# Patient Record
Sex: Female | Born: 1970 | Race: White | Hispanic: No | Marital: Single | Smoking: Current every day smoker
Health system: Southern US, Community
[De-identification: ages and names within clinical notes are randomized; demographics above are authoritative.]

## PROBLEM LIST (undated history)

## (undated) ENCOUNTER — Ambulatory Visit

## (undated) DIAGNOSIS — J439 Emphysema, unspecified: Secondary | ICD-10-CM

## (undated) DIAGNOSIS — K5090 Crohn's disease, unspecified, without complications: Secondary | ICD-10-CM

## (undated) DIAGNOSIS — F909 Attention-deficit hyperactivity disorder, unspecified type: Secondary | ICD-10-CM

## (undated) DIAGNOSIS — M5030 Other cervical disc degeneration, unspecified cervical region: Secondary | ICD-10-CM

## (undated) DIAGNOSIS — E7800 Pure hypercholesterolemia, unspecified: Secondary | ICD-10-CM

## (undated) DIAGNOSIS — J449 Chronic obstructive pulmonary disease, unspecified: Secondary | ICD-10-CM

## (undated) DIAGNOSIS — I10 Essential (primary) hypertension: Secondary | ICD-10-CM

## (undated) DIAGNOSIS — F419 Anxiety disorder, unspecified: Secondary | ICD-10-CM

## (undated) HISTORY — DX: Emphysema, unspecified: J43.9

## (undated) HISTORY — DX: Chronic obstructive pulmonary disease, unspecified: J44.9

## (undated) HISTORY — PX: TUBAL LIGATION: SHX77

## (undated) HISTORY — DX: Crohn's disease, unspecified, without complications: K50.90

## (undated) HISTORY — DX: Pure hypercholesterolemia, unspecified: E78.00

## (undated) HISTORY — DX: Attention-deficit hyperactivity disorder, unspecified type: F90.9

---

## 2000-07-28 ENCOUNTER — Other Ambulatory Visit: Admission: RE | Admit: 2000-07-28 | Discharge: 2000-07-28 | Payer: Self-pay | Admitting: *Deleted

## 2000-11-03 ENCOUNTER — Encounter: Payer: Self-pay | Admitting: *Deleted

## 2000-11-03 ENCOUNTER — Ambulatory Visit (HOSPITAL_COMMUNITY): Admission: RE | Admit: 2000-11-03 | Discharge: 2000-11-03 | Payer: Self-pay | Admitting: *Deleted

## 2001-01-21 ENCOUNTER — Encounter: Payer: Self-pay | Admitting: *Deleted

## 2001-01-21 ENCOUNTER — Ambulatory Visit (HOSPITAL_COMMUNITY): Admission: RE | Admit: 2001-01-21 | Discharge: 2001-01-21 | Payer: Self-pay | Admitting: *Deleted

## 2001-01-24 ENCOUNTER — Inpatient Hospital Stay (HOSPITAL_COMMUNITY): Admission: AD | Admit: 2001-01-24 | Discharge: 2001-01-24 | Payer: Self-pay | Admitting: *Deleted

## 2001-03-06 ENCOUNTER — Inpatient Hospital Stay (HOSPITAL_COMMUNITY): Admission: RE | Admit: 2001-03-06 | Discharge: 2001-03-08 | Payer: Self-pay | Admitting: Obstetrics and Gynecology

## 2001-03-25 ENCOUNTER — Emergency Department (HOSPITAL_COMMUNITY): Admission: EM | Admit: 2001-03-25 | Discharge: 2001-03-25 | Payer: Self-pay | Admitting: *Deleted

## 2001-06-09 ENCOUNTER — Other Ambulatory Visit: Admission: RE | Admit: 2001-06-09 | Discharge: 2001-06-09 | Payer: Self-pay | Admitting: *Deleted

## 2004-01-22 ENCOUNTER — Ambulatory Visit (HOSPITAL_COMMUNITY): Admission: RE | Admit: 2004-01-22 | Discharge: 2004-01-22 | Payer: Self-pay | Admitting: *Deleted

## 2004-02-19 ENCOUNTER — Ambulatory Visit (HOSPITAL_COMMUNITY): Admission: RE | Admit: 2004-02-19 | Discharge: 2004-02-19 | Payer: Self-pay | Admitting: *Deleted

## 2004-06-17 ENCOUNTER — Ambulatory Visit (HOSPITAL_COMMUNITY): Admission: RE | Admit: 2004-06-17 | Discharge: 2004-06-17 | Payer: Self-pay | Admitting: Family Medicine

## 2004-07-04 ENCOUNTER — Emergency Department (HOSPITAL_COMMUNITY): Admission: EM | Admit: 2004-07-04 | Discharge: 2004-07-04 | Payer: Self-pay | Admitting: Emergency Medicine

## 2005-09-29 ENCOUNTER — Ambulatory Visit (HOSPITAL_COMMUNITY): Admission: RE | Admit: 2005-09-29 | Discharge: 2005-09-29 | Payer: Self-pay | Admitting: Family Medicine

## 2006-08-04 ENCOUNTER — Ambulatory Visit (HOSPITAL_COMMUNITY): Admission: RE | Admit: 2006-08-04 | Discharge: 2006-08-04 | Payer: Self-pay | Admitting: Family Medicine

## 2007-12-09 ENCOUNTER — Ambulatory Visit (HOSPITAL_COMMUNITY): Admission: RE | Admit: 2007-12-09 | Discharge: 2007-12-09 | Payer: Self-pay | Admitting: Family Medicine

## 2008-03-15 ENCOUNTER — Ambulatory Visit (HOSPITAL_COMMUNITY): Admission: RE | Admit: 2008-03-15 | Discharge: 2008-03-15 | Payer: Self-pay | Admitting: Family Medicine

## 2008-04-08 ENCOUNTER — Emergency Department (HOSPITAL_COMMUNITY): Admission: EM | Admit: 2008-04-08 | Discharge: 2008-04-08 | Payer: Self-pay | Admitting: Emergency Medicine

## 2008-08-15 ENCOUNTER — Emergency Department (HOSPITAL_COMMUNITY): Admission: EM | Admit: 2008-08-15 | Discharge: 2008-08-15 | Payer: Self-pay | Admitting: Emergency Medicine

## 2008-09-25 ENCOUNTER — Ambulatory Visit (HOSPITAL_COMMUNITY): Admission: RE | Admit: 2008-09-25 | Discharge: 2008-09-25 | Payer: Self-pay | Admitting: Family Medicine

## 2008-09-25 ENCOUNTER — Emergency Department (HOSPITAL_COMMUNITY): Admission: EM | Admit: 2008-09-25 | Discharge: 2008-09-25 | Payer: Self-pay | Admitting: Emergency Medicine

## 2008-10-10 ENCOUNTER — Emergency Department (HOSPITAL_COMMUNITY): Admission: EM | Admit: 2008-10-10 | Discharge: 2008-10-10 | Payer: Self-pay | Admitting: Emergency Medicine

## 2008-11-08 ENCOUNTER — Emergency Department (HOSPITAL_COMMUNITY): Admission: EM | Admit: 2008-11-08 | Discharge: 2008-11-08 | Payer: Self-pay | Admitting: Emergency Medicine

## 2009-01-01 ENCOUNTER — Encounter
Admission: RE | Admit: 2009-01-01 | Discharge: 2009-01-01 | Payer: Self-pay | Admitting: Physical Medicine & Rehabilitation

## 2009-03-13 ENCOUNTER — Emergency Department (HOSPITAL_COMMUNITY): Admission: EM | Admit: 2009-03-13 | Discharge: 2009-03-13 | Payer: Self-pay | Admitting: Emergency Medicine

## 2009-06-20 ENCOUNTER — Emergency Department (HOSPITAL_COMMUNITY): Admission: EM | Admit: 2009-06-20 | Discharge: 2009-06-20 | Payer: Self-pay | Admitting: Emergency Medicine

## 2009-09-08 ENCOUNTER — Emergency Department (HOSPITAL_COMMUNITY): Admission: EM | Admit: 2009-09-08 | Discharge: 2009-09-08 | Payer: Self-pay | Admitting: Emergency Medicine

## 2009-11-11 ENCOUNTER — Emergency Department (HOSPITAL_COMMUNITY): Admission: EM | Admit: 2009-11-11 | Discharge: 2009-11-11 | Payer: Self-pay | Admitting: Emergency Medicine

## 2009-12-31 ENCOUNTER — Emergency Department (HOSPITAL_COMMUNITY): Admission: EM | Admit: 2009-12-31 | Discharge: 2009-12-31 | Payer: Self-pay | Admitting: Emergency Medicine

## 2010-04-21 ENCOUNTER — Encounter: Payer: Self-pay | Admitting: *Deleted

## 2010-04-21 ENCOUNTER — Encounter: Payer: Self-pay | Admitting: Internal Medicine

## 2010-07-06 LAB — WET PREP, GENITAL
Trich, Wet Prep: NONE SEEN
Yeast Wet Prep HPF POC: NONE SEEN

## 2010-07-15 LAB — URINALYSIS, ROUTINE W REFLEX MICROSCOPIC
Glucose, UA: NEGATIVE mg/dL
Specific Gravity, Urine: >1.03 — ABNORMAL HIGH (ref 1.005–1.030)
pH: 5 (ref 5.0–8.0)

## 2010-08-16 NOTE — Discharge Summary (Signed)
[facility]  Patient:    Leslie Lowery, Leslie Lowery Visit Number: [id number] MRN: [id number]          Service Type: OBS Location: 4A A428 01 Attending Physician:  Jeri Cos. Dictated by:   Christin Bach, M.D. Admit Date:  01/24/2001 Discharge Date: 01/24/2001                             Discharge Summary  ADMITTING DIAGNOSES: 1. Pregnancy at [redacted] weeks gestation. 2. Third trimester bleeding. 3. Partial placenta previa.  DISCHARGE DIAGNOSES: 1. Pregnancy at [redacted] weeks gestation. 2. Third trimester bleeding.  PROCEDURES: 1. Betamethasone administration for lung maturity. 2. Terbutaline tocolysis.  DISPOSITION:  Transferred to [facility].  HOSPITAL SUMMARY:  This 40 year old female, gravida 2, para 1, with unreliable criteria except for ultrasound suggesting her to be [redacted] weeks gestation and an 18-week ultrasound as well at this facility; the patient was 30 weeks at my best estimate.  She presented with a heavy gush of vaginal bleeding, two days after last sexual activity, with no other significant physical activity noted. There was no gush of fluid.  External monitoring shows a mild uterine irritability pattern with every one- to one-and-a-half-minute contractions. Heart rate shows a reactive pattern.  Patient was admitted for magnesium sulfate tocolysis.  PAST MEDICAL HISTORY:  Benign.  SURGICAL HISTORY:  LST C-section, August 1990, laser conization of the cervix in 1988.  PHYSICAL EXAMINATION:  Height 5 feet 6 inches.  Weight 185 pounds.  Pertinent abdominal exam shows mild uterine irritability.  Cervical exam is deferred.  HOSPITAL COURSE:  Patient had an ultrasound performed by me confirming a left-sided low-lying placenta with large portion of the placental tissue both anterior and posterior on the lower uterine segment on the left side.  The patient was admitted for bedrest, pad counts and consideration for  steroid therapy.  Patient was observed for a few hours and at approximately 9:30 p.m. on the 27th, the patient began to bleed again.  Patient was considered at that point in time to be at risk for marginal separation and obligatory delivery. Patient was therefore shipped to [facility] for continued observation due to the prematurity of the infant.  CONDITION ON DISCHARGE:  Stable.  FOLLOWUP:  ______ Dictated by:   Christin Bach, M.D. Attending Physician:  Jeri Cos. DD:  02/14/01 TD:  02/15/01 Job: [id number]

## 2010-08-16 NOTE — Discharge Summary (Signed)
[facility]  Patient:    Leslie Lowery, Leslie Lowery Visit Number: [id number] MRN: [id number]          Service Type: MED Location: 4A A418 01 Attending Physician:  Jeri Cos. Dictated by:   [name], M.D. Admit Date:  03/06/2001 Discharge Date: 03/08/2001                             Discharge Summary  DIAGNOSES: 1. A 36-plus-week intrauterine pregnancy. 2. Spontaneous rupture of membranes and preterm labor. 3. Previous low transverse cesarean section; the patient desires repeat, with    tubal ligation. 4. Class A1 diet-controlled gestational diabetes.  LABORATORY DATA:  Blood type O positive.  Hemoglobin 11.6, hematocrit 33.3; postpartum day #1 9.5/27.3.  PEDIATRICIAN ON CALL:  Vivia Ewing, D.O.  Delivered is a double-footling breech presentation female infant weighing 6 pounds, 14.7 ounces.  HISTORY OF PRESENT ILLNESS AND HOSPITAL COURSE:  See previous dictations.  The patient presented to [facility] on March 06, 2001, with obvious rupture of membranes and moderate amount of vaginal bleeding due to marginal sinus separation of the placenta.  Thus, the patient was taken to the operating room.  Utilizing spinal analgesic, a primary low transverse cesarean section and intraoperative tubal ligation was performed without complications. Postoperatively the patient had a JP catheter in the subcutaneous space as well as Foley catheter placed to straight drainage.  These were both removed on postoperative day #1, at which time the incision was noted to be intact, minimal erythema, minimal induration.  The patient continued to advance her diet, such that on March 08, 2001, the patient was tolerating liquids well. However, she still had not taken any solid food and still had not taken any p.o. pain medication.  Thus, diet as well as medication were advanced on March 08, 2001, to be sure that she was able to tolerate these.  FOLLOW-UP:  The  patient is to follow up in the office in two to three days time for staple removal. Dictated by:   [name], M.D. Attending Physician:  Jeri Cos. DD:  03/08/01 TD:  03/09/01 Job: [id number]

## 2010-08-16 NOTE — Op Note (Signed)
[facility]  Patient:    Leslie Lowery, Leslie Lowery Visit Number: [id number] MRN: [id number]          Service Type: MED Location: 4A A418 01 Attending Physician:  Tilda Burrow Dictated by:   [name], M.D. Proc. Date: 03/06/01 Admit Date:  03/06/2001   CC:         Vivia Ewing, D.O.   Operative Report  POSTOPERATIVE DIAGNOSES:  1.  Intrauterine pregnancy at 36+ weeks.  2.  Vaginal bleeding secondary to marginal sinus separation.  3.  Spontaneous rupture of membranes.  4.  Preterm labor.  5.  Previous low transverse cesarean section.  6.  Patient desires permanent sterilization.  7.  Class A1 gestational diabetes mellitus during this pregnancy with      dietary control only.  OPERATION/PROCEDURE:  Repeat low transverse cesarean section with intraoperative tubal ligation.  SURGEON:  [name], M.D.  FINDINGS:  Delivered of a 6 pound 14.7 ounce female infant.  ESTIMATED BLOOD LOSS:  Eight hundred cc.  ANALGESIA:  Spinal.  PEDIATRICIAN ON-CALL:  Dr. Milford Cage.  DRAINS:  Foley catheter to straight drainage.  Likewise, a JP catheter was placed within the subcutaneous space.  INDICATIONS FOR PROCEDURE:  The patient presented in early labor with rupture of membranes.  We made preparations for repeat low transverse cesarean section.  The patient did receive two doses of 0.25 mg subcu terbutaline for prophylaxis while we were preparing for the cesarean section.  DESCRIPTION OF PROCEDURE:  The patient was taken to the operating room.  Vital signs were stable.  Fetal heart tones remained reassuring.  The patient had a spinal analgesic administered by the anesthesia staff.  She was then returned to the operating room table with slight left lateral tilt.  A Foley catheter had been sterilely placed to straight drainage on the labor and delivery Floor.  The patients abdomen was then sterilely prepped and draped in the usual manner.  After assuring  adequate surgical analgesia a sharp knife was used to excise the previous Pfannenstiel incision through the skin, dissecting down to the fascial plane utilizing a sharp knife, and cauterizing all bleeders along the way.  The fascia was then incised in transverse curvilinear manner utilizing the Mayo scissors.  The fascial edges were then grasped using straight Kocher clamps and the fascia separated from the underlying rectus muscle in the midline utilizing sharp dissection with the Mayo scissors.  The rectus muscles were then bluntly separated to aid atraumatic entry into the peritoneal cavity.  The peritoneal incision was then extended superiorly and inferiorly and inferiorly we directly visualized the bladder to avoid accidental injury.  The bladder blade was then placed.  The lower uterine segment was identified and palpated.  There was no palpable vertex presenting. A bladder flap was then created from the vesicouterine fold by dissecting the avascular plane.  A sharp knife was then used to score a low transverse uterine incision.  The amniotic sac was encountered in the midline, with additional clear amniotic fluid noted.  The index fingers were then used to bluntly extend the uterine incision bilaterally.  My right hand then reached into the uterine cavity, at which time presenting was noted two feet, and thus double footling breech presentation.  With fundal pressure as well as traction on the feet I was able to deliver the infant to the level of the buttocks. With one hand on the patients back and one hand on the patients tummy I was able to  reduce the shoulders anteriorly across the infants chest, and deliver the shoulders and arms, followed by the remainder of the infant.  The head was flexed utilizing a Mauriceau maneuver and delivered atraumatically.  The umbilical cord was then milked toward the infant and the cord doubly clamped and cut.  The infant did not have spontaneous  vigorous breathing and cry and was taken to the nursing table for immediate assessment.   Arterial cord gas and cord blood were then obtained.  Gentle traction on the umbilical cord resulted in separation, which appears to be somewhat of a fragmented placenta, with some adherent clot to the maternal surface.  Intrauterine exploration then revealed no retained placental fragments.  Thus, the uterus was exteriorized.  The uterine incision was noted to not have extended.  The uterine incision was then closed in two layers with 0 chromic in running locked fashion, the second layer being an imbricating layer, which resulted in excellent hemostasis.  Tubes and ovaries were noted to be normal in appearance.  Tubal ligation was then performed as follows.  Each of the tubes was grasped in its mid portion and elevated.  Two pieces of #1 plain suture were then placed at the base of the loop of tube formed.  This allowed removal of a knuckle of right fallopian tube as well as left fallopian tube.  A suture was placed through the left.  The pedicles were dried from the tubal ligation. Irrigation was then performed of the cul-de-sac until clear.  The uterus was then returned to the pelvic cavity.  Sponge, needle, and instrument counts were correct x 2 at this point.  The peritoneal edges were then grasped using Kelly clamps and the peritoneum closed with a continuous running 0 chromic suture.  Rectus muscles were reapproximated utilizing continuous running 0 chromic suture.  Subfascial bleeders were cauterized.  The fascia was then closed with a continuous running #1 PDS suture, two sutures required, and they were tied together in the midline.  Subcutaneous bleeders were then cauterized.  A JP drain was placed within the subcutaneous space with a separate exit wound to the right apex of the incision.  The JP drain was sutured into place.  The subcutaneous space was irrigated with Betadine solution.   Three interrupted #1 PDS sutures were then placed  through-and-through the skin edges to act as retention type suture, which allowed stapling of the skin edges resulting in complete closure.  The patient tolerated the procedure well.  To facilitate postoperative analgesia 25 cc of 0.5% bupivacaine was injected subcutaneously along the incision.  The patient was taken to the recovery room in stable condition.  Continues to drain clear yellow urine.  Operative findings were discussed with the patients awaiting family. Dictated by:   [name], M.D. Attending Physician:  Tilda Burrow DD:  03/08/01 TD:  03/08/01 Job: [id number]

## 2010-08-16 NOTE — H&P (Signed)
[facility]  Patient:    Leslie Lowery, Leslie Lowery Visit Number: [id number] MRN: [id number]          Service Type: OBS Location: 4A A415 01 Attending Physician:  Jeri Cos. Dictated by:   Christin Bach, M.D. Admit Date:  01/24/2001   CC:         [name], M.D.   History and Physical  ADMITTING DIAGNOSES:  Pregnancy, [redacted] weeks gestation, third trimester bleeding, partial placenta previa.  HISTORY OF PRESENT ILLNESS:  This 40 year old female, gravida 2, para 1, last menstrual period June 22, 2000 not considered reliable with ultrasound suggesting her to be 9 weeks by ______ length on August 31, 2000 placing menstrual [facility] April 05, 2000. By this criteria, she is ________ weeks. The patient recently had an ultrasound at [redacted] weeks gestation at [facility] which recognized a partial placenta previa with follow-up ultrasound performed last Thursday and reported by patient as being still present. She has been sexually active as recently as two days ago and has not been particularly physical active otherwise. There has been no gushing of fluid. Upon arrival, the external monitoring shows a uterine irritability pattern with mild uterine contractions every 1 to 1 1/2 minutes with fetal heart rate tracing showing a reactive heart rate pattern. The patient is admitted for labor management and may need some sulfate tocolysis.  Ultrasound by me has been performed which confirms the left sided low lying placenta with large caudal bleeding of placental tissue both anterior and posterior lower uterine segments on the left side. There is possibly some blood below the bleeding placental edge. This is thought to be the source of the patients bleeding. The patient is compliant with bed rest at present. Pad count will be continued and consideration of steroid therapy.  PAST MEDICAL HISTORY:  Benign.  PAST SURGICAL HISTORY:  Primary low transverse cervical  cesarean section February 24, 1989 for failure to dilate. Laser cone of the cervix 1988 with normal Pap smears since then.  PHYSICAL EXAMINATION:  VITAL SIGNS:  Height 5 feet 6, weight about 185.  ABDOMEN:  Nontender with mild uterine irritability. Cervix deferred. Ultrasound showed some periplacental hyperechoic areas suggestive of marginal sinus placental bleeding, less than 5%.  PLAN:  Overnight admission with betamethasone therapy, limited activity, magnesium sulfate tocolysis. Dictated by:   Christin Bach, M.D. Attending Physician:  Jeri Cos. DD:  01/24/01 TD:  01/24/01 Job: [id number]

## 2010-08-16 NOTE — Procedures (Signed)
NAMEDIANNA, Leslie Lowery             ACCOUNT NO.:  [id number]   MEDICAL RECORD NO.:  [id number]          PATIENT TYPE:  OUT   LOCATION:  RESP                          FACILITY:  [facility]   PHYSICIAN:  Edward L. Juanetta Gosling, M.D.DATE OF BIRTH:  January 25, 1971   DATE OF PROCEDURE:  DATE OF DISCHARGE:                              PULMONARY FUNCTION TEST   1.  Spirometry is generally normal with a slightly decreased FEV-1 to FVC      ratio, suggesting of airflow obstruction.  2.  Lung volumes show no evidence of restrictive change but do show some      evidence of air trapping, again consistent with air flow obstruction.  3.  DLCO is moderately reduced.   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      ELH/MEDQ  D:  06/18/2004  T:  06/18/2004  Job:  [id number]

## 2010-08-16 NOTE — Consult Note (Signed)
[facility]  Patient:    Leslie Lowery, Leslie Lowery Visit Number: [id number] MRN: [id number]          Service Type: EMS Location: ED Attending Physician:  Ilean Skill Dictated by:   Christin Bach, M.D. Admit Date:  03/25/2001   CC:         Leslie Lowery, M.D.   Consultation Report  REASON FOR CONSULTATION:  Drainage from recent C-section incision.  HISTORY OF PRESENT ILLNESS:  This 40 year old female gravida 2, para 2, now 3 weeks status post C-section and tubal ligation performed through a Pfannenstiel incision presents after 24 hours of malodorous discharge from a Pfannenstiel incision.  The patient had C-section approximately 3 weeks ago for partial placental previa with prior cesarean having been performed.  Tubal ligation was performed at the same time.  Hospital records not available at the time of this dictation but reportedly uneventful intraoperative time. Pregnancy being complicated by several weeks hospitalization at [facility] for placenta previa.  The patient began to have malodorous, clear, watery discharge from the incision yesterday and after a day of using towels and wash cloths she presents to the emergency room.  PHYSICAL EXAMINATION:  VITAL SIGNS:  She is now with a low grade fever with temperature 99.4, pulse 103, respirations 20, blood pressure 113/74.  GENERAL:  She was a healthy, highly anxious, Caucasian female, appropriate, and consistent with prior visits.  Patient is well known to me and Dr. Lisette Grinder.  The patient is alert, oriented and anxious and accompanied by husband.  SKIN:  Warm, normal color with no dehydration, normal respirations.  NOTE:  The patient states that she has had dysuria and pressure when the bladder is full but without frequency.  ABDOMEN:  Moderate size panniculus with draining 1 cm area of ecchymosis in the center of the old C-section scar.  Scar is healing somewhat irregularly having  everted on the right side but the tissues are without frank purulence. The discharge is somewhat watery.  PROCEDURE:  The wound is cleansed with Betadine and then hydrogen peroxide and then local anesthesia used to infiltrate around the suspected drainage site. The wound easily separates to a distance of 3 cm transversely and is able to be probed with a finger to a depth of approximately 3 cm.  Cleansing with hydrogen peroxide followed by placement of wet-to-dry dressing with 4 x 4 gauze is performed.  This was performed under Demerol IM injection for analgesia.  IMPRESSION:  Wound seroma, infected (wound cellulitis).  MEDICATIONS: 1. Cipro 500 mg b.i.d. x 7 days. 2. Tylox 1 q.4h. p.r.n. pain dispense #15.  FOLLOWUP:  The patient is to see Dr. Lisette Grinder in the a.m. for dressing change. May require more than 1 dressing change before healing acceptable for allowing delayed secondary closure.Dictated by:   Christin Bach, M.D. Attending Physician:  Ilean Skill DD:  03/25/01 TD:  03/26/01 Job: [id number]

## 2011-08-05 ENCOUNTER — Emergency Department (HOSPITAL_COMMUNITY)
Admission: EM | Admit: 2011-08-05 | Discharge: 2011-08-05 | Disposition: A | Discharge status: Home / Self Care | Payer: No Typology Code available for payment source | Attending: Emergency Medicine | Admitting: Emergency Medicine

## 2011-08-05 ENCOUNTER — Encounter (HOSPITAL_COMMUNITY): Payer: Self-pay

## 2011-08-05 ENCOUNTER — Emergency Department (HOSPITAL_COMMUNITY): Payer: No Typology Code available for payment source

## 2011-08-05 DIAGNOSIS — S139XXA Sprain of joints and ligaments of unspecified parts of neck, initial encounter: Secondary | ICD-10-CM | POA: Insufficient documentation

## 2011-08-05 DIAGNOSIS — Y92410 Unspecified street and highway as the place of occurrence of the external cause: Secondary | ICD-10-CM | POA: Insufficient documentation

## 2011-08-05 DIAGNOSIS — F17200 Nicotine dependence, unspecified, uncomplicated: Secondary | ICD-10-CM | POA: Insufficient documentation

## 2011-08-05 DIAGNOSIS — S161XXA Strain of muscle, fascia and tendon at neck level, initial encounter: Secondary | ICD-10-CM

## 2011-08-05 DIAGNOSIS — S0990XA Unspecified injury of head, initial encounter: Secondary | ICD-10-CM | POA: Insufficient documentation

## 2011-08-05 DIAGNOSIS — M542 Cervicalgia: Secondary | ICD-10-CM | POA: Insufficient documentation

## 2011-08-05 HISTORY — DX: Anxiety disorder, unspecified: F41.9

## 2011-08-05 HISTORY — DX: Other cervical disc degeneration, unspecified cervical region: M50.30

## 2011-08-05 MED ORDER — NAPROXEN 375 MG PO TABS: 375.0000 mg | ORAL_TABLET | Freq: Two times a day (BID) | ORAL | Status: AC

## 2011-08-05 MED ORDER — HYDROMORPHONE HCL PF 1 MG/ML IJ SOLN
1.0000 mg | Freq: Once | INTRAMUSCULAR | Status: AC
  Administered 2011-08-05: 1 mg via INTRAMUSCULAR
  Filled 2011-08-05: qty 1

## 2011-08-05 MED ORDER — CYCLOBENZAPRINE HCL 10 MG PO TABS: 10.0000 mg | ORAL_TABLET | Freq: Two times a day (BID) | ORAL | Status: AC | PRN

## 2011-08-05 NOTE — ED Notes (Signed)
Pt eval and backboard removed by EDP
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
## 2011-08-05 NOTE — Discharge Instructions (Signed)
X-rays of head and neck were normal. You'll be sore for several days. Medication for pain and muscle spasm.

## 2012-03-03 ENCOUNTER — Encounter: Payer: Self-pay | Admitting: Cardiology

## 2012-03-03 NOTE — Progress Notes (Signed)
  HPI: 41 year old female for evaluation of chest pain. Patient was seen in the emergency room in November with chest pain. Chest x-ray showed no acute disease. Electrocardiogram reportedly normal. Cardiac enzymes negative. D-dimer negative. Hemoglobin 12.8. Pain was felt to be musculoskeletal. Patient referred for further evaluation.  Current Outpatient Prescriptions  Medication Sig Dispense Refill  . naproxen (NAPROSYN) 375 MG tablet Take 1 tablet (375 mg total) by mouth 2 (two) times daily.  20 tablet  0    No Known Allergies  Past Medical History  Diagnosis Date  . Anxiety   . Disc disease, degenerative, cervical     No past surgical history on file.  History   Social History  . Marital Status: Single    Spouse Name: N/A    Number of Children: N/A  . Years of Education: N/A   Occupational History  . Not on file.   Social History Main Topics  . Smoking status: Current Every Day Smoker    Types: Cigarettes  . Smokeless tobacco: Not on file  . Alcohol Use: No  . Drug Use: No  . Sexually Active:    Other Topics Concern  . Not on file   Social History Narrative  . No narrative on file    No family history on file.  ROS: no fevers or chills, productive cough, hemoptysis, dysphasia, odynophagia, melena, hematochezia, dysuria, hematuria, rash, seizure activity, orthopnea, PND, pedal edema, claudication. Remaining systems are negative.  Physical Exam:   There were no vitals taken for this visit.  General:  Well developed/well nourished in NAD Skin warm/dry Patient not depressed No peripheral clubbing Back-normal HEENT-normal/normal eyelids Neck supple/normal carotid upstroke bilaterally; no bruits; no JVD; no thyromegaly chest - CTA/ normal expansion CV - RRR/normal S1 and S2; no murmurs, rubs or gallops;  PMI nondisplaced Abdomen -NT/ND, no HSM, no mass, + bowel sounds, no bruit 2+ femoral pulses, no bruits Ext-no edema, chords, 2+ DP Neuro-grossly  nonfocal  ECG    This encounter was created in error - please disregard.

## 2018-05-05 DIAGNOSIS — Z79899 Other long term (current) drug therapy: Secondary | ICD-10-CM | POA: Diagnosis not present

## 2018-05-05 DIAGNOSIS — M545 Low back pain: Secondary | ICD-10-CM | POA: Diagnosis not present

## 2018-05-05 DIAGNOSIS — E559 Vitamin D deficiency, unspecified: Secondary | ICD-10-CM | POA: Diagnosis not present

## 2018-05-05 DIAGNOSIS — M129 Arthropathy, unspecified: Secondary | ICD-10-CM | POA: Diagnosis not present

## 2018-05-05 DIAGNOSIS — G8929 Other chronic pain: Secondary | ICD-10-CM | POA: Diagnosis not present

## 2018-05-06 ENCOUNTER — Emergency Department (HOSPITAL_COMMUNITY)
Admission: EM | Admit: 2018-05-06 | Discharge: 2018-05-07 | Disposition: A | Discharge status: Home / Self Care | Payer: Medicaid Other | Attending: Emergency Medicine | Admitting: Emergency Medicine

## 2018-05-06 ENCOUNTER — Encounter: Payer: Self-pay | Admitting: Emergency Medicine

## 2018-05-06 ENCOUNTER — Other Ambulatory Visit: Payer: Self-pay

## 2018-05-06 ENCOUNTER — Emergency Department (HOSPITAL_COMMUNITY): Payer: Medicaid Other

## 2018-05-06 DIAGNOSIS — R4781 Slurred speech: Secondary | ICD-10-CM | POA: Diagnosis not present

## 2018-05-06 DIAGNOSIS — F419 Anxiety disorder, unspecified: Secondary | ICD-10-CM | POA: Insufficient documentation

## 2018-05-06 DIAGNOSIS — F17210 Nicotine dependence, cigarettes, uncomplicated: Secondary | ICD-10-CM | POA: Insufficient documentation

## 2018-05-06 DIAGNOSIS — R531 Weakness: Secondary | ICD-10-CM | POA: Insufficient documentation

## 2018-05-06 DIAGNOSIS — I10 Essential (primary) hypertension: Secondary | ICD-10-CM | POA: Diagnosis not present

## 2018-05-06 DIAGNOSIS — R52 Pain, unspecified: Secondary | ICD-10-CM | POA: Diagnosis not present

## 2018-05-06 DIAGNOSIS — R51 Headache: Secondary | ICD-10-CM | POA: Diagnosis not present

## 2018-05-06 DIAGNOSIS — R0902 Hypoxemia: Secondary | ICD-10-CM | POA: Diagnosis not present

## 2018-05-06 DIAGNOSIS — R252 Cramp and spasm: Secondary | ICD-10-CM | POA: Diagnosis present

## 2018-05-06 LAB — CBC WITH DIFFERENTIAL/PLATELET
ABS IMMATURE GRANULOCYTES: 0.02 10*3/uL (ref 0.00–0.07)
Basophils Absolute: 0.1 10*3/uL (ref 0.0–0.1)
Basophils Relative: 0 %
Eosinophils Absolute: 0.1 10*3/uL (ref 0.0–0.5)
Eosinophils Relative: 1 %
HCT: 38.1 % (ref 36.0–46.0)
Hemoglobin: 12 g/dL (ref 12.0–15.0)
Immature Granulocytes: 0 %
Lymphocytes Relative: 25 %
Lymphs Abs: 3 10*3/uL (ref 0.7–4.0)
MCH: 26.9 pg (ref 26.0–34.0)
MCHC: 31.5 g/dL (ref 30.0–36.0)
MCV: 85.4 fL (ref 80.0–100.0)
MONO ABS: 0.6 10*3/uL (ref 0.1–1.0)
Monocytes Relative: 5 %
NEUTROS ABS: 8.2 10*3/uL — AB (ref 1.7–7.7)
Neutrophils Relative %: 69 %
Platelets: 301 10*3/uL (ref 150–400)
RBC: 4.46 MIL/uL (ref 3.87–5.11)
RDW: 13.5 % (ref 11.5–15.5)
WBC: 11.9 10*3/uL — AB (ref 4.0–10.5)
nRBC: 0 % (ref 0.0–0.2)

## 2018-05-06 LAB — BASIC METABOLIC PANEL
ANION GAP: 7 (ref 5–15)
BUN: 22 mg/dL — AB (ref 6–20)
CO2: 27 mmol/L (ref 22–32)
Calcium: 8.6 mg/dL — ABNORMAL LOW (ref 8.9–10.3)
Chloride: 104 mmol/L (ref 98–111)
Creatinine, Ser: 0.85 mg/dL (ref 0.44–1.00)
GFR calc Af Amer: >60 mL/min (ref >60)
Glucose, Bld: 116 mg/dL — ABNORMAL HIGH (ref 70–99)
POTASSIUM: 3.5 mmol/L (ref 3.5–5.1)
Sodium: 138 mmol/L (ref 135–145)

## 2018-05-06 MED ORDER — DIPHENHYDRAMINE HCL 50 MG/ML IJ SOLN
25.0000 mg | Freq: Once | INTRAMUSCULAR | Status: AC
  Administered 2018-05-06: 25 mg via INTRAVENOUS
  Filled 2018-05-06: qty 1

## 2018-05-06 MED ORDER — SODIUM CHLORIDE 0.9 % IV BOLUS
1000.0000 mL | Freq: Once | INTRAVENOUS | Status: AC
  Administered 2018-05-06: 1000 mL via INTRAVENOUS

## 2018-05-06 NOTE — ED Provider Notes (Signed)
[facility]GENCY [facility]PARTMENT Provider Note   CSN: [id number] Arrival date & time: 05/06/18  2158     History   Chief Complaint Chief Complaint  Patient presents with  . Medication Reaction    HPI Leslie Lowery is a 48 y.o. female.  She is presenting by ambulance after evaluation of onset of inability to move her arms or legs and speech difficulties about an hour prior to arrival.  She said she has had a headache for the last few days but she sometimes will get them.  She used to be on blood pressure medicines but had stopped them for years and is recently started them about 2 days ago.  Yesterday she was given a prescription for baclofen which she is taking for a muscle relaxant.  She was having difficulty using her muscles tonight and felt like she could not walk and called an ambulance.  This is never happened to her before.  Denies any recent illness symptoms.  No vomiting or diarrhea no urinary symptoms.  No double vision or blurry vision.  The history is provided by the patient.  Allergic Reaction  Presenting symptoms: no rash   Presenting symptoms comment:  Muscle spasms Severity:  Severe Duration:  1 hour Prior allergic episodes:  No prior episodes Context: medications   Relieved by:  None tried Worsened by:  Nothing Ineffective treatments:  None tried   Past Medical History:  Diagnosis Date  . Anxiety   . Disc disease, degenerative, cervical     There are no active problems to display for this patient.   Past Surgical History:  Procedure Laterality Date  . CESAREAN SECTION       OB History   No obstetric history on file.      Home Medications    Prior to Admission medications   Not on File    Family History History reviewed. No pertinent family history.  Social History Social History   Tobacco Use  . Smoking status: Current Every Day Smoker    Types: Cigarettes  . Smokeless tobacco: Never Used  Substance Use Topics  . Alcohol use: No   . Drug use: Yes    Types: Marijuana     Allergies   Patient has no known allergies.   Review of Systems Review of Systems  Constitutional: Negative for fever.  HENT: Negative for sore throat.   Eyes: Negative for visual disturbance.  Respiratory: Negative for shortness of breath.   Cardiovascular: Negative for chest pain.  Gastrointestinal: Negative for abdominal pain.  Genitourinary: Negative for dysuria.  Musculoskeletal: Negative for neck pain.  Skin: Negative for rash.  Neurological: Positive for speech difficulty and headaches. Negative for numbness.     Physical Exam Updated Vital Signs BP 110/87   Pulse 78   Temp 98.3 F (36.8 C) (Oral)   Resp 16   Ht 5\' 4"  (1.626 m)   Wt 104.3 kg   SpO2 94%   BMI 39.48 kg/m   Physical Exam Vitals signs and nursing note reviewed.  Constitutional:      General: She is not in acute distress.    Appearance: She is well-developed.  HENT:     Head: Normocephalic and atraumatic.  Eyes:     Conjunctiva/sclera: Conjunctivae normal.  Neck:     Musculoskeletal: Neck supple.  Cardiovascular:     Rate and Rhythm: Normal rate and regular rhythm.     Heart sounds: No murmur.  Pulmonary:     Effort: Pulmonary  effort is normal. No respiratory distress.     Breath sounds: Normal breath sounds.  Abdominal:     Palpations: Abdomen is soft.     Tenderness: There is no abdominal tenderness.  Musculoskeletal:        General: No tenderness or signs of injury.  Skin:    General: Skin is warm and dry.     Capillary Refill: Capillary refill takes less than 2 seconds.  Neurological:     Mental Status: She is alert and oriented to person, place, and time.     Comments: She has a slightly slurred speech.  It already seemed better the second time I went back to talk to her.  Her upper and lower extremity sensation and strength are intact.  She has noticed some tingling on the right side of her body but that seems improved.      ED  Treatments / Results  Labs (all labs ordered are listed, but only abnormal results are displayed) Labs Reviewed  BASIC METABOLIC PANEL - Abnormal; Notable for the following components:      Result Value   Glucose, Bld 116 (*)    BUN 22 (*)    Calcium 8.6 (*)    All other components within normal limits  CBC WITH DIFFERENTIAL/PLATELET - Abnormal; Notable for the following components:   WBC 11.9 (*)    Neutro Abs 8.2 (*)    All other components within normal limits    EKG EKG Interpretation  Date/Time:  Thursday May 06 2018 22:25:59 EST Ventricular Rate:  70 PR Interval:    QRS Duration: 114 QT Interval:  385 QTC Calculation: 416 R Axis:   18 Text Interpretation:  Sinus rhythm Incomplete right bundle branch block no prior to compare with Confirmed by Meridee ScoreButler, Gwyndolyn Guilford [phone]([id number]) on 05/06/2018 10:30:57 PM   Radiology Ct Head Wo Contrast  Result Date: 05/06/2018 CLINICAL DATA:  48 year old female with headache, muscle spasms. EXAM: CT HEAD WITHOUT CONTRAST TECHNIQUE: Contiguous axial images were obtained from the base of the skull through the vertex without intravenous contrast. COMPARISON:  Head CT 08/05/2011. FINDINGS: Brain: Normal cerebral volume. No midline shift, ventriculomegaly, mass effect, evidence of mass lesion, intracranial hemorrhage or evidence of cortically based acute infarction. Gray-white matter differentiation is within normal limits throughout the brain. Vascular: Calcified atherosclerosis at the skull base. No suspicious intracranial vascular hyperdensity. Skull: Negative. Sinuses/Orbits: Visualized paranasal sinuses are stable and well pneumatized. Right mastoid effusion has resolved. Other: Visualized orbits and scalp soft tissues are within normal limits. IMPRESSION: Stable and normal noncontrast CT appearance of the brain. Electronically Signed   By: Odessa FlemingH  Hall M.D.   On: 05/06/2018 23:11    Procedures Procedures (including critical care time)  Medications  Ordered in ED Medications  sodium chloride 0.9 % bolus 1,000 mL (0 mLs Intravenous Stopped 05/06/18 2327)  diphenhydrAMINE (BENADRYL) injection 25 mg (25 mg Intravenous Given 05/06/18 2236)     Initial Impression / Assessment and Plan / ED Course  I have reviewed the triage vital signs and the nursing notes.  Pertinent labs & imaging results that were available during my care of the patient were reviewed by me and considered in my medical decision making (see chart for details).  Clinical Course as of May 07 1044  Thu May 06, 2018  2238 Reexamined the patient and got a little bit more history.  She said she has been off her blood pressure medicine for 6 months and just started again 2 days ago  putting her back on all of her same medication at once.  That and also adding the baclofen for some of her chronic back pain.  Since then she has been feeling very sluggish and tonight did not feel like her muscles could move.  Her blood pressure here is 110/87.  I think mom the most likely explanation is that she is overmedicated and hypotensive along with a new medication that may also be giving her some side effects.  She said she is taken 4 doses of the baclofen over the last 2 days.  I do not think this is a baclofen overdose at all.   [MB]  2239 Currently she says the right side tingling of her body is improved and her muscles are starting to respond to her.  She is also got more clear speech but still little bit of a slur to it.  She is in for head CT and giving her some IV fluids.   [MB]  2312 Reevaluated.  She still feels improved.  She is able to move all of her extremities without any difficulty.  Her speech still seems a little off but her family says is almost back to baseline.  Her blood work is fairly unremarkable.  Waiting on the head CT reading.   [MB]  2348 Patient was able to ambulate here although still felt a little lightheaded.  Blood pressure systolic 116.  I think it would be reasonable  to haLF her blood pressure medicine, and have her stop the baclofen.  I asked her to contact her doctor tomorrow regarding this plan.   [MB]    Clinical Course User Index [MB] Terrilee Files, MD    Final Clinical Impressions(s) / ED Diagnoses   Final diagnoses:  Weakness generalized  Slurred speech    ED Discharge Orders    None       Terrilee Files, MD 05/07/18 1047

## 2018-05-06 NOTE — Discharge Instructions (Addendum)
You were evaluated in the emergency department for feeling weakness in your arms and legs and some slurred speech.  Your symptoms improved while you were here.  Your lab work was unremarkable.  Your CAT scan did not show any obvious findings.  This is likely related to your new medications and it will be important for you to review this with your doctor tomorrow.  I would not take the baclofen anymore.  You also will need to likely reduce the blood pressure medicine, so only take 1/2 tablet of the bisoprolol.   Please stay well-hydrated.  Return if any concerns

## 2018-05-06 NOTE — ED Notes (Signed)
Pt returned from CT °

## 2018-05-06 NOTE — ED Triage Notes (Signed)
Pt with ha and uncontrolled muscle spasms all over.  Pt started a new muscle relaxer yesterday.  Pt not able to ambulate which is new.   cbg 144

## 2018-05-06 NOTE — ED Notes (Signed)
Patient transported to CT 

## 2018-05-06 NOTE — ED Notes (Signed)
Pt c/o HA for past few days.  Last dose of baclofen at around 1500 today. Pt with involuntary movement and speech difficult to express.

## 2018-05-20 DIAGNOSIS — E559 Vitamin D deficiency, unspecified: Secondary | ICD-10-CM | POA: Diagnosis not present

## 2018-05-20 DIAGNOSIS — M5136 Other intervertebral disc degeneration, lumbar region: Secondary | ICD-10-CM | POA: Diagnosis not present

## 2018-05-20 DIAGNOSIS — G894 Chronic pain syndrome: Secondary | ICD-10-CM | POA: Diagnosis not present

## 2018-05-20 DIAGNOSIS — F329 Major depressive disorder, single episode, unspecified: Secondary | ICD-10-CM | POA: Diagnosis not present

## 2018-09-04 ENCOUNTER — Other Ambulatory Visit: Payer: Self-pay

## 2018-09-04 ENCOUNTER — Encounter (HOSPITAL_COMMUNITY): Payer: Self-pay | Admitting: Emergency Medicine

## 2018-09-04 ENCOUNTER — Emergency Department (HOSPITAL_COMMUNITY): Payer: Medicaid Other

## 2018-09-04 ENCOUNTER — Inpatient Hospital Stay (HOSPITAL_COMMUNITY)
Admission: EM | Admit: 2018-09-04 | Discharge: 2018-09-08 | DRG: 419 | Disposition: A | Discharge status: Home / Self Care | Payer: Medicaid Other | Attending: General Surgery | Admitting: General Surgery

## 2018-09-04 DIAGNOSIS — N200 Calculus of kidney: Secondary | ICD-10-CM | POA: Diagnosis not present

## 2018-09-04 DIAGNOSIS — R197 Diarrhea, unspecified: Secondary | ICD-10-CM

## 2018-09-04 DIAGNOSIS — K828 Other specified diseases of gallbladder: Secondary | ICD-10-CM | POA: Diagnosis present

## 2018-09-04 DIAGNOSIS — R1011 Right upper quadrant pain: Secondary | ICD-10-CM | POA: Diagnosis present

## 2018-09-04 DIAGNOSIS — F419 Anxiety disorder, unspecified: Secondary | ICD-10-CM | POA: Diagnosis present

## 2018-09-04 DIAGNOSIS — K219 Gastro-esophageal reflux disease without esophagitis: Secondary | ICD-10-CM | POA: Diagnosis present

## 2018-09-04 DIAGNOSIS — Z1159 Encounter for screening for other viral diseases: Secondary | ICD-10-CM

## 2018-09-04 DIAGNOSIS — Z03818 Encounter for observation for suspected exposure to other biological agents ruled out: Secondary | ICD-10-CM | POA: Diagnosis not present

## 2018-09-04 DIAGNOSIS — R160 Hepatomegaly, not elsewhere classified: Secondary | ICD-10-CM | POA: Diagnosis not present

## 2018-09-04 DIAGNOSIS — K811 Chronic cholecystitis: Principal | ICD-10-CM

## 2018-09-04 DIAGNOSIS — I10 Essential (primary) hypertension: Secondary | ICD-10-CM | POA: Diagnosis present

## 2018-09-04 DIAGNOSIS — F17210 Nicotine dependence, cigarettes, uncomplicated: Secondary | ICD-10-CM | POA: Diagnosis present

## 2018-09-04 DIAGNOSIS — K760 Fatty (change of) liver, not elsewhere classified: Secondary | ICD-10-CM

## 2018-09-04 DIAGNOSIS — Z6837 Body mass index (BMI) 37.0-37.9, adult: Secondary | ICD-10-CM

## 2018-09-04 DIAGNOSIS — R112 Nausea with vomiting, unspecified: Secondary | ICD-10-CM | POA: Diagnosis not present

## 2018-09-04 DIAGNOSIS — N3289 Other specified disorders of bladder: Secondary | ICD-10-CM | POA: Diagnosis not present

## 2018-09-04 DIAGNOSIS — R030 Elevated blood-pressure reading, without diagnosis of hypertension: Secondary | ICD-10-CM | POA: Diagnosis present

## 2018-09-04 DIAGNOSIS — Z01818 Encounter for other preprocedural examination: Secondary | ICD-10-CM

## 2018-09-04 DIAGNOSIS — I7090 Unspecified atherosclerosis: Secondary | ICD-10-CM | POA: Diagnosis not present

## 2018-09-04 HISTORY — DX: Essential (primary) hypertension: I10

## 2018-09-04 LAB — COMPREHENSIVE METABOLIC PANEL
ALT: 18 U/L (ref 0–44)
AST: 15 U/L (ref 15–41)
Albumin: 4.6 g/dL (ref 3.5–5.0)
Alkaline Phosphatase: 82 U/L (ref 38–126)
Anion gap: 12 (ref 5–15)
BUN: 14 mg/dL (ref 6–20)
CO2: 27 mmol/L (ref 22–32)
Calcium: 9.3 mg/dL (ref 8.9–10.3)
Chloride: 102 mmol/L (ref 98–111)
Creatinine, Ser: 0.82 mg/dL (ref 0.44–1.00)
GFR calc Af Amer: >60 mL/min (ref >60)
GFR calc non Af Amer: >60 mL/min (ref >60)
Glucose, Bld: 96 mg/dL (ref 70–99)
Potassium: 3.9 mmol/L (ref 3.5–5.1)
Sodium: 141 mmol/L (ref 135–145)
Total Bilirubin: 0.5 mg/dL (ref 0.3–1.2)
Total Protein: 7.8 g/dL (ref 6.5–8.1)

## 2018-09-04 LAB — CBC WITH DIFFERENTIAL/PLATELET
Abs Immature Granulocytes: 0.02 10*3/uL (ref 0.00–0.07)
Basophils Absolute: 0.1 10*3/uL (ref 0.0–0.1)
Basophils Relative: 1 %
Eosinophils Absolute: 0.2 10*3/uL (ref 0.0–0.5)
Eosinophils Relative: 2 %
HCT: 42.9 % (ref 36.0–46.0)
Hemoglobin: 14 g/dL (ref 12.0–15.0)
Immature Granulocytes: 0 %
Lymphocytes Relative: 25 %
Lymphs Abs: 2.3 10*3/uL (ref 0.7–4.0)
MCH: 27.5 pg (ref 26.0–34.0)
MCHC: 32.6 g/dL (ref 30.0–36.0)
MCV: 84.3 fL (ref 80.0–100.0)
Monocytes Absolute: 0.5 10*3/uL (ref 0.1–1.0)
Monocytes Relative: 5 %
Neutro Abs: 6.4 10*3/uL (ref 1.7–7.7)
Neutrophils Relative %: 67 %
Platelets: 298 10*3/uL (ref 150–400)
RBC: 5.09 MIL/uL (ref 3.87–5.11)
RDW: 14.1 % (ref 11.5–15.5)
WBC: 9.6 10*3/uL (ref 4.0–10.5)
nRBC: 0 % (ref 0.0–0.2)

## 2018-09-04 LAB — URINALYSIS, ROUTINE W REFLEX MICROSCOPIC
Bilirubin Urine: NEGATIVE
Glucose, UA: NEGATIVE mg/dL
Hgb urine dipstick: NEGATIVE
Ketones, ur: NEGATIVE mg/dL
Leukocytes,Ua: NEGATIVE
Nitrite: NEGATIVE
Protein, ur: NEGATIVE mg/dL
Specific Gravity, Urine: 1.025 (ref 1.005–1.030)
pH: 5 (ref 5.0–8.0)

## 2018-09-04 LAB — PREGNANCY, URINE: Preg Test, Ur: NEGATIVE

## 2018-09-04 LAB — LIPASE, BLOOD: Lipase: 26 U/L (ref 11–51)

## 2018-09-04 LAB — SARS CORONAVIRUS 2 BY RT PCR (HOSPITAL ORDER, PERFORMED IN ~~LOC~~ HOSPITAL LAB): SARS Coronavirus 2: NEGATIVE

## 2018-09-04 MED ORDER — ONDANSETRON HCL 4 MG/2ML IJ SOLN: 4.0000 mg | Freq: Four times a day (QID) | INTRAMUSCULAR | Status: DC | PRN

## 2018-09-04 MED ORDER — NICOTINE 14 MG/24HR TD PT24
14.0000 mg | MEDICATED_PATCH | Freq: Every day | TRANSDERMAL | Status: DC
  Administered 2018-09-04 – 2018-09-08 (×5): 14 mg via TRANSDERMAL
  Filled 2018-09-04 (×5): qty 1

## 2018-09-04 MED ORDER — HYDROMORPHONE HCL 1 MG/ML IJ SOLN
0.5000 mg | Freq: Once | INTRAMUSCULAR | Status: AC
  Administered 2018-09-04: 0.5 mg via INTRAVENOUS
  Filled 2018-09-04: qty 1

## 2018-09-04 MED ORDER — MORPHINE SULFATE (PF) 4 MG/ML IV SOLN
4.0000 mg | Freq: Once | INTRAVENOUS | Status: AC
  Administered 2018-09-04: 4 mg via INTRAVENOUS
  Filled 2018-09-04: qty 1

## 2018-09-04 MED ORDER — ONDANSETRON HCL 4 MG PO TABS: 4.0000 mg | ORAL_TABLET | Freq: Four times a day (QID) | ORAL | Status: DC | PRN

## 2018-09-04 MED ORDER — IOHEXOL 300 MG/ML  SOLN
100.0000 mL | Freq: Once | INTRAMUSCULAR | Status: AC | PRN
  Administered 2018-09-04: 16:00:00 100 mL via INTRAVENOUS

## 2018-09-04 MED ORDER — MORPHINE SULFATE (PF) 4 MG/ML IV SOLN
4.0000 mg | INTRAVENOUS | Status: DC | PRN
  Administered 2018-09-04 – 2018-09-07 (×23): 4 mg via INTRAVENOUS
  Filled 2018-09-04 (×23): qty 1

## 2018-09-04 MED ORDER — SODIUM CHLORIDE 0.9 % IV SOLN
INTRAVENOUS | Status: DC
  Administered 2018-09-04 – 2018-09-08 (×8): via INTRAVENOUS

## 2018-09-04 MED ORDER — ONDANSETRON HCL 4 MG/2ML IJ SOLN
4.0000 mg | Freq: Once | INTRAMUSCULAR | Status: AC
  Administered 2018-09-04: 15:00:00 4 mg via INTRAVENOUS
  Filled 2018-09-04: qty 2

## 2018-09-04 MED ORDER — HYDROXYZINE HCL 25 MG PO TABS
25.0000 mg | ORAL_TABLET | Freq: Three times a day (TID) | ORAL | Status: DC | PRN
  Administered 2018-09-04 – 2018-09-08 (×8): 25 mg via ORAL
  Filled 2018-09-04 (×8): qty 1

## 2018-09-04 MED ORDER — SODIUM CHLORIDE 0.9 % IV BOLUS
1000.0000 mL | Freq: Once | INTRAVENOUS | Status: AC
  Administered 2018-09-04: 1000 mL via INTRAVENOUS

## 2018-09-04 MED ORDER — LISINOPRIL 10 MG PO TABS
10.0000 mg | ORAL_TABLET | Freq: Every day | ORAL | Status: DC
  Administered 2018-09-04 – 2018-09-05 (×2): 10 mg via ORAL
  Filled 2018-09-04 (×2): qty 1

## 2018-09-04 NOTE — ED Provider Notes (Signed)
[facility] Provider Note   CSN: [id number] Arrival date & time: 09/04/18  1404    History   Chief Complaint Chief Complaint  Patient presents with  . Flank Pain    HPI Quentin MullingKimberly D Lowery is a 48 y.o. female with a PMH of anxiety, cervical DDD, and HTN presents with intermittent RUQ sharp abdominal pain radiating to the right back onset 1 week ago. Patient states nothing makes symptoms better or worse. Patient states she has tried ibuprofen without relief. Patient reports intermittent diaphoresis, nausea, non bilious non bloody vomiting, and non bloody diarrhea for a 2-3 days. Patient states she has had 4 episodes of vomiting and 3 episodes of diarrhea in the last 24 hours. Patient denies fever, chest pain, shortness of breath, cough, congestion, sick exposures, or recent travel. Patient states she has had a c-section in the past, but denies any other abdominal surgeries. Patient states LMP was at age 48. Patient denies dysuria, hematuria, or urinary frequency. Patient states she last ate yesterday at 5:30pm. Patient reports tobacco and marijuana use. Patient denies alcohol use.     HPI  Past Medical History:  Diagnosis Date  . Anxiety   . Disc disease, degenerative, cervical   . Hypertension     There are no active problems to display for this patient.   Past Surgical History:  Procedure Laterality Date  . CESAREAN SECTION       OB History   No obstetric history on file.      Home Medications    Prior to Admission medications   Medication Sig Start Date End Date Taking? Authorizing Provider  acetaminophen (TYLENOL) 500 MG tablet Take 500 mg by mouth every 6 (six) hours as needed for mild pain or moderate pain.   Yes [provider]  ibuprofen (ADVIL) 200 MG tablet Take 200 mg by mouth every 6 (six) hours as needed for mild pain or moderate pain.   Yes [provider]    Family History No family history on file.  Social  History Social History   Tobacco Use  . Smoking status: Current Every Day Smoker    Types: Cigarettes  . Smokeless tobacco: Never Used  Substance Use Topics  . Alcohol use: No  . Drug use: Yes    Types: Marijuana     Allergies   Patient has no known allergies.   Review of Systems Review of Systems  Constitutional: Positive for appetite change and diaphoresis. Negative for activity change, chills, fever and unexpected weight change.  HENT: Negative for congestion, rhinorrhea and sore throat.   Eyes: Negative for visual disturbance.  Respiratory: Negative for cough and shortness of breath.   Cardiovascular: Negative for chest pain.  Gastrointestinal: Positive for abdominal pain, diarrhea, nausea and vomiting. Negative for constipation.  Endocrine: Negative for polydipsia, polyphagia and polyuria.  Genitourinary: Negative for dysuria, flank pain and frequency.  Musculoskeletal: Positive for back pain. Negative for gait problem and myalgias.  Skin: Negative for rash.  Allergic/Immunologic: Negative for immunocompromised state.  Neurological: Negative for dizziness and weakness.  Psychiatric/Behavioral: The patient is not nervous/anxious.     Physical Exam Updated Vital Signs BP (!) 174/93   Pulse 97   Temp 98.3 F (36.8 C) (Oral)   Resp 18   Ht 5\' 3"  (1.6 m)   Wt 95.3 kg   SpO2 94%   BMI 37.20 kg/m   Physical Exam Vitals signs and nursing note reviewed.  Constitutional:  General: She is not in acute distress.    Appearance: She is well-developed. She is obese. She is not diaphoretic.     Comments: Patient appears uncomfortable due to abdominal pain.   HENT:     Head: Normocephalic and atraumatic.     Nose: Nose normal.     Mouth/Throat:     Mouth: Mucous membranes are dry.     Pharynx: No oropharyngeal exudate or posterior oropharyngeal erythema.  Eyes:     General: No scleral icterus.    Extraocular Movements: Extraocular movements intact.      Conjunctiva/sclera: Conjunctivae normal.     Pupils: Pupils are equal, round, and reactive to light.  Neck:     Musculoskeletal: Normal range of motion and neck supple.  Cardiovascular:     Rate and Rhythm: Normal rate and regular rhythm.     Heart sounds: Normal heart sounds. No murmur. No friction rub. No gallop.   Pulmonary:     Effort: Pulmonary effort is normal. No respiratory distress.     Breath sounds: Normal breath sounds. No wheezing or rales.  Abdominal:     General: Abdomen is protuberant. Bowel sounds are normal. There is no distension.     Palpations: Abdomen is soft. Abdomen is not rigid. There is no mass.     Tenderness: There is abdominal tenderness in the right upper quadrant and epigastric area. There is guarding. There is no right CVA tenderness, left CVA tenderness or rebound. Positive signs include Murphy's sign. Negative signs include McBurney's sign.     Hernia: No hernia is present.  Musculoskeletal: Normal range of motion.     Cervical back: Normal. She exhibits normal range of motion, no tenderness and no bony tenderness.     Thoracic back: Normal. She exhibits normal range of motion, no tenderness and no bony tenderness.     Lumbar back: Normal. She exhibits normal range of motion, no tenderness and no bony tenderness.  Skin:    Findings: No rash.  Neurological:     Mental Status: She is alert and oriented to person, place, and time.    ED Treatments / Results  Labs (all labs ordered are listed, but only abnormal results are displayed) Labs Reviewed  URINALYSIS, ROUTINE W REFLEX MICROSCOPIC - Abnormal; Notable for the following components:      Result Value   APPearance HAZY (*)    All other components within normal limits  SARS CORONAVIRUS 2 (HOSPITAL ORDER, PERFORMED IN [facility])  CBC WITH DIFFERENTIAL/PLATELET  COMPREHENSIVE METABOLIC PANEL  LIPASE, BLOOD  PREGNANCY, URINE  HEPATITIS PANEL, ACUTE    EKG None  Radiology Ct  Abdomen Pelvis W Contrast  Result Date: 09/04/2018 CLINICAL DATA:  Right flank pain EXAM: CT ABDOMEN AND PELVIS WITH CONTRAST TECHNIQUE: Multidetector CT imaging of the abdomen and pelvis was performed using the standard protocol following bolus administration of intravenous contrast. CONTRAST:  100mL OMNIPAQUE IOHEXOL 300 MG/ML  SOLN COMPARISON:  03/15/2008 FINDINGS: Lower chest: No acute abnormality. Hepatobiliary: Hepatomegaly, maximum coronal span 24 cm. No solid liver abnormality is seen. No gallstones, gallbladder wall thickening, or biliary dilatation. Pancreas: Unremarkable. No pancreatic ductal dilatation or surrounding inflammatory changes. Spleen: Normal in size without significant abnormality. Adrenals/Urinary Tract: Adrenal glands are unremarkable. Punctuate nonobstructive calculus of the midportion of the left kidney. Thickening of the urinary bladder. Stomach/Bowel: Stomach is within normal limits. Appendix appears normal. No evidence of bowel wall thickening, distention, or inflammatory changes. Vascular/Lymphatic: Scattered calcific atherosclerosis. No enlarged  abdominal or pelvic lymph nodes. Reproductive: No mass or other significant abnormality. Other: No abdominal wall hernia or abnormality. No abdominopelvic ascites. Musculoskeletal: No acute or significant osseous findings. IMPRESSION: 1.  Hepatomegaly. 2. Thickening of the urinary bladder, suggestive of infectious or inflammatory cystitis. Correlate with urinalysis. 3. Punctuate nonobstructive calculus of the midportion of the left kidney. No right-sided or ureteral calculus. No hydronephrosis. Electronically Signed   By: Lauralyn PrimesAlex  Bibbey M.D.   On: 09/04/2018 16:03    Procedures Procedures (including critical care time)  Medications Ordered in ED Medications  sodium chloride 0.9 % bolus 1,000 mL (0 mLs Intravenous Stopped 09/04/18 1630)  ondansetron (ZOFRAN) injection 4 mg (4 mg Intravenous Given 09/04/18 1450)  morphine 4 MG/ML injection  4 mg (4 mg Intravenous Given 09/04/18 1450)  iohexol (OMNIPAQUE) 300 MG/ML solution 100 mL (100 mLs Intravenous Contrast Given 09/04/18 1538)  morphine 4 MG/ML injection 4 mg (4 mg Intravenous Given 09/04/18 1626)     Initial Impression / Assessment and Plan / ED Course  I have reviewed the triage vital signs and the nursing notes.  Pertinent labs & imaging results that were available during my care of the patient were reviewed by me and considered in my medical decision making (see chart for details).  Clinical Course as of Sep 03 1720  Sat Sep 04, 2018  1448 CBC is unremarkable. WBCs are within normal limits.  CBC with Differential [AH]  1505 CMP is unremarkable. No electrolyte abnormalities.  Comprehensive metabolic panel [AH]  1505 Lipase is within normal limits.  Lipase, blood [AH]  1521 Reassessed patient. Patient states symptoms have improved. Patient appears more comfortable. Patient has not had any vomiting at this time.   [AH]  1612 CT abdomen reveals hepatomegaly. Liver enzymes are within normal limits. Thickening of the urinary bladder, suggestive of infectious or inflammatory cystitis. UA does not reveal signs of UTI. Punctuate nonobstructive calculus of the midportion of the left kidney. No right-sided or ureteral calculus. No hydronephrosis.    CT Abdomen Pelvis W Contrast [AH]  1644 Reassessed patient. Patient states pain improved temporarily with pain medicine, but patient continues to endorse pain.   [AH]  1714 Patient continues to endorse abdominal pain. Will plan to admit patient.   [AH]    Clinical Course User Index [AH] Leretha DykesHernandez, Sadia Belfiore P, PA-C      Patient presents with abdominal pain, nausea, vomiting, and diarrhea. Labs are unremarkable including CBC, CMP, UA, and lipase. Provided IVF, zofran, and pain control while in the ER. Patient is nontoxic, nonseptic appearing.  Patient's pain managed in emergency department. Labs, imaging and vitals reviewed.  RUQ  ultrasound not available at this facility during this time. CT abdomen reveals hepatomegaly, thickening of urinary bladder, and punctate nonobstructive calculus of the midportion of left kidney. UA does not reveal signs of infection. Patient continues to endorse abdominal pain. Will consult general surgery, Dr. Henreitta LeberBridges, due to concern for gallbladder etiology. Dr. Henreitta LeberBridges recommends admission for observation and ultrasound in the morning if patient is not able to tolerate PO intake and pain remains uncontrolled. If pain improves and patient is able to tolerate PO intake, Dr. Henreitta LeberBridges recommends next day ultrasound with outpatient follow up. Ordered hepatitis panel due to hepatomegaly. Patient continues to endorse abdominal pain despite pain medicine. Patient has nausea with water intake and is not able to tolerate eating at this time. Patient will need admission for pain control and ultrasound in the morning. Will consult hospitalist for admission. Hospitalist has  agreed to admit patient.   Findings and plan of care discussed with supervising physician Dr. Roderic Palau who personally evaluated and examined this patient.   Final Clinical Impressions(s) / ED Diagnoses   Final diagnoses:  RUQ abdominal pain  Nausea vomiting and diarrhea    ED Discharge Orders    None       Arville Lime, [place] 09/04/18 1722    Milton Ferguson, MD 09/05/18 2008

## 2018-09-04 NOTE — ED Triage Notes (Signed)
Patient now states that pain does radiate into right upper abd.

## 2018-09-04 NOTE — ED Notes (Signed)
Pt intake of water with very minimal nausea and no vomiting.

## 2018-09-04 NOTE — Discharge Instructions (Addendum)
Discharge Laparoscopic Surgery Instructions:  Common Complaints: Right shoulder pain is common after laparoscopic surgery. This is secondary to the gas used in the surgery being trapped under the diaphragm.  Walk to help your body absorb the gas. This will improve in a few days. Pain at the port sites are common, especially the larger port sites. This will improve with time.  Some nausea is common and poor appetite. The main goal is to stay hydrated the first few days after surgery.  Post Operative Telephone call will be Performed on 09/21/18 @ 11:15AM. If you need to be seen in person please let us know.  Diet/ Activity: Diet as tolerated. You may not have an appetite, but it is important to stay hydrated. Drink 64 ounces of water a day. Your appetite will return with time.  Shower per your regular routine daily.  Do not take hot showers. Take warm showers that are less than 10 minutes. Rest and listen to your body, but do not remain in bed all day.  Walk everyday for at least 15-20 minutes. Deep cough and move around every 1-2 hours in the first few days after surgery.  Do not lift > 10 lbs, perform excessive bending, pushing, pulling, squatting for 1-2 weeks after surgery.  Do not pick at the dermabond glue on your incision sites.  This glue film will remain in place for 1-2 weeks and will start to peel off.  Do not place lotions or balms on your incision unless instructed to specifically by Dr. Constance Haw.   Medication: Take tylenol and ibuprofen as needed for pain control, alternating every 4-6 hours.  Example:  Tylenol 1000mg  @ 6am, 12noon, 6pm, 30midnight (Do not exceed 4000mg  of tylenol a day). Ibuprofen 800mg  @ 9am, 3pm, 9pm, 3am (Do not exceed 3600mg  of ibuprofen a day).  Take Roxicodone for breakthrough pain every 4 hours.  Take Colace for constipation related to narcotic pain medication. If you do not have a bowel movement in 2 days, take Miralax over the counter.  Drink plenty of  water to also prevent constipation.   Contact Information: If you have questions or concerns, please call our office, [phone], Monday- Thursday 8AM-5PM and Friday 8AM-12Noon.  If it is after hours or on the weekend, please call [facility], [phone], and ask to speak to the surgeon on call for Dr. Constance Haw at [facility].    Laparoscopic Cholecystectomy, Care After This sheet gives you information about how to care for yourself after your procedure. Your doctor may also give you more specific instructions. If you have problems or questions, contact your doctor. Follow these instructions at home: Care for cuts from surgery (incisions)   Follow instructions from your doctor about how to take care of your cuts from surgery. Make sure you: ? Wash your hands with soap and water before you change your bandage (dressing). If you cannot use soap and water, use hand sanitizer. ? Change your bandage as told by your doctor. ? Leave stitches (sutures), skin glue, or skin tape (adhesive) strips in place. They may need to stay in place for 2 weeks or longer. If tape strips get loose and curl up, you may trim the loose edges. Do not remove tape strips completely unless your doctor says it is okay.  Do not take baths, swim, or use a hot tub until your doctor says it is okay.   You may shower.  Check your surgical cut area every day for signs of infection. Check  for: ? More redness, swelling, or pain. ? More fluid or blood. ? Warmth. ? Pus or a bad smell. Activity  Do not drive or use heavy machinery while taking prescription pain medicine.  Do not lift anything that is heavier than 10 lb (4.5 kg) until your doctor says it is okay.  Do not play contact sports until your doctor says it is okay.  Do not drive for 24 hours if you were given a medicine to help you relax (sedative).  Rest as needed. Do not return to work or school until your doctor says it is okay. General  instructions  Take over-the-counter and prescription medicines only as told by your doctor.  To prevent or treat constipation while you are taking prescription pain medicine, your doctor may recommend that you: ? Drink enough fluid to keep your pee (urine) clear or pale yellow. ? Take over-the-counter or prescription medicines. ? Eat foods that are high in fiber, such as fresh fruits and vegetables, whole grains, and beans. ? Limit foods that are high in fat and processed sugars, such as fried and sweet foods. Contact a doctor if:  You develop a rash.  You have more redness, swelling, or pain around your surgical cuts.  You have more fluid or blood coming from your surgical cuts.  Your surgical cuts feel warm to the touch.  You have pus or a bad smell coming from your surgical cuts.  You have a fever.  One or more of your surgical cuts breaks open. Get help right away if:  You have trouble breathing.  You have chest pain.  You have pain that is getting worse in your shoulders.  You faint or feel dizzy when you stand.  You have very bad pain in your belly (abdomen).  You are sick to your stomach (nauseous) for more than one day.  You have throwing up (vomiting) that lasts for more than one day.  You have leg pain. This information is not intended to replace advice given to you by your health care provider. Make sure you discuss any questions you have with your health care provider. Document Released: 12/25/2007 Document Revised: 10/06/2015 Document Reviewed: 09/03/2015 Elsevier Interactive Patient Education  2019 [employer]

## 2018-09-04 NOTE — ED Triage Notes (Signed)
Patient c/o right flank pain x1 week. Per patient pain has increased in past 2 days. Denies any radiation of pain or urinary symptoms. Unsure of any fevers, but states "sweats." Per patient nausea, vomiting (x4 yellow emesis in past last 24 hours), and several episodes of diarrhea. Patient concerned about gallbladder stating intermittent pain x6 months.

## 2018-09-04 NOTE — H&P (Signed)
History and Physical  Leslie Lowery ZOX:[id number] DOB: October 12, 1970 DOA: 09/04/2018  Referring physician: Carlyle BasquesAna Hernandez, PA-C, ED provider PCP: Patient, No Pcp Per  Outpatient Specialists:   Patient Coming From: home  Chief Complaint: RUQ abdominal pain  HPI: Leslie MullingKimberly D Fuson is a 48 y.o. female with a history of anxiety, hypertension.  Patient presents to the hospital with 10-14 days of worsening right upper quadrant pain that radiates into her right back.  The pain is worsening.  She has been vomiting over the past 36 hours due to the pain.  Pain worse with eating and improved with pain medicine provided in the ED.  No other palliating or provoking factors.  Emergency Department Course: LFTs and lipase normal.  White count 9.6.  CT shows hepatomegaly with no gallbladder wall thickening or biliary dilation.  There is a nonobstructing kidney stone in the left kidney.  No hydronephrosis.    Review of Systems:  Pt denies any fevers, chills, constipation, abdominal pain, shortness of breath, dyspnea on exertion, orthopnea, cough, wheezing, palpitations, headache, vision changes, lightheadedness, dizziness, melena, rectal bleeding.  Review of systems are otherwise negative  Past Medical History:  Diagnosis Date  . Anxiety   . Disc disease, degenerative, cervical   . Hypertension    Past Surgical History:  Procedure Laterality Date  . CESAREAN SECTION     Social History:  reports that she has been smoking cigarettes. She has never used smokeless tobacco. She reports current drug use. Drug: Marijuana. She reports that she does not drink alcohol. Patient lives at home  No Known Allergies  No family history on file.  Patient denies family history of liver disease.  Prior to Admission medications   Medication Sig Start Date End Date Taking? Authorizing Provider  acetaminophen (TYLENOL) 500 MG tablet Take 500 mg by mouth every 6 (six) hours as needed for mild pain or moderate pain.    Yes [provider]  ibuprofen (ADVIL) 200 MG tablet Take 200 mg by mouth every 6 (six) hours as needed for mild pain or moderate pain.   Yes [provider]    Physical Exam: BP 135/88   Pulse 80   Temp 98.3 F (36.8 C) (Oral)   Resp 18   Ht 5\' 3"  (1.6 m)   Wt 95.3 kg   SpO2 96%   BMI 37.20 kg/m   . General: Middle-aged Caucasian female. Awake and alert and oriented x3. No acute cardiopulmonary distress.  Marland Kitchen. HEENT: Normocephalic atraumatic.  Right and left ears normal in appearance.  Pupils equal, round, reactive to light. Extraocular muscles are intact. Sclerae anicteric and noninjected.  Moist mucosal membranes. No mucosal lesions.  . Neck: Neck supple without lymphadenopathy. No carotid bruits. No masses palpated.  . Cardiovascular: Regular rate with normal S1-S2 sounds. No murmurs, rubs, gallops auscultated. No JVD.  Marland Kitchen. Respiratory: Good respiratory effort with no wheezes, rales, rhonchi. Lungs clear to auscultation bilaterally.  No accessory muscle use. . Abdomen: Soft.  Right upper quadrant tenderness to palpation with positive Murphy sign.  No rebound or guarding.. Active bowel sounds. No masses or hepatosplenomegaly  . Skin: No rashes, lesions, or ulcerations.  Dry, warm to touch. 2+ dorsalis pedis and radial pulses. . Musculoskeletal: No calf or leg pain. All major joints not erythematous nontender.  No upper or lower joint deformation.  Good ROM.  No contractures  . Psychiatric: Intact judgment and insight. Pleasant and cooperative. . Neurologic: No focal neurological deficits. Strength is 5/5 and  symmetric in upper and lower extremities.  Cranial nerves II through XII are grossly intact.           Labs on Admission: I have personally reviewed following labs and imaging studies  CBC: Recent Labs  Lab 09/04/18 1431  WBC 9.6  NEUTROABS 6.4  HGB 14.0  HCT 42.9  MCV 84.3  PLT 298   Basic Metabolic Panel: Recent Labs  Lab 09/04/18 1431  NA 141  K  3.9  CL 102  CO2 27  GLUCOSE 96  BUN 14  CREATININE 0.82  CALCIUM 9.3   GFR: Estimated Creatinine Clearance: 92.2 mL/min (by C-G formula based on SCr of 0.82 mg/dL). Liver Function Tests: Recent Labs  Lab 09/04/18 1431  AST 15  ALT 18  ALKPHOS 82  BILITOT 0.5  PROT 7.8  ALBUMIN 4.6   Recent Labs  Lab 09/04/18 1431  LIPASE 26   No results for input(s): AMMONIA in the last 168 hours. Coagulation Profile: No results for input(s): INR, PROTIME in the last 168 hours. Cardiac Enzymes: No results for input(s): CKTOTAL, CKMB, CKMBINDEX, TROPONINI in the last 168 hours. BNP (last 3 results) No results for input(s): PROBNP in the last 8760 hours. HbA1C: No results for input(s): HGBA1C in the last 72 hours. CBG: No results for input(s): GLUCAP in the last 168 hours. Lipid Profile: No results for input(s): CHOL, HDL, LDLCALC, TRIG, CHOLHDL, LDLDIRECT in the last 72 hours. Thyroid Function Tests: No results for input(s): TSH, T4TOTAL, FREET4, T3FREE, THYROIDAB in the last 72 hours. Anemia Panel: No results for input(s): VITAMINB12, FOLATE, FERRITIN, TIBC, IRON, RETICCTPCT in the last 72 hours. Urine analysis:    Component Value Date/Time   COLORURINE YELLOW 09/04/2018 1431   APPEARANCEUR HAZY (A) 09/04/2018 1431   LABSPEC 1.025 09/04/2018 1431   PHURINE 5.0 09/04/2018 1431   GLUCOSEU NEGATIVE 09/04/2018 1431   HGBUR NEGATIVE 09/04/2018 1431   BILIRUBINUR NEGATIVE 09/04/2018 1431   KETONESUR NEGATIVE 09/04/2018 1431   PROTEINUR NEGATIVE 09/04/2018 1431   UROBILINOGEN 0.2 04/08/2008 1128   NITRITE NEGATIVE 09/04/2018 1431   LEUKOCYTESUR NEGATIVE 09/04/2018 1431   Sepsis Labs: @LABRCNTIP (procalcitonin:4,lacticidven:4) )No results found for this or any previous visit (from the past 240 hour(s)).   Radiological Exams on Admission: Ct Abdomen Pelvis W Contrast  Result Date: 09/04/2018 CLINICAL DATA:  Right flank pain EXAM: CT ABDOMEN AND PELVIS WITH CONTRAST TECHNIQUE:  Multidetector CT imaging of the abdomen and pelvis was performed using the standard protocol following bolus administration of intravenous contrast. CONTRAST:  100mL OMNIPAQUE IOHEXOL 300 MG/ML  SOLN COMPARISON:  03/15/2008 FINDINGS: Lower chest: No acute abnormality. Hepatobiliary: Hepatomegaly, maximum coronal span 24 cm. No solid liver abnormality is seen. No gallstones, gallbladder wall thickening, or biliary dilatation. Pancreas: Unremarkable. No pancreatic ductal dilatation or surrounding inflammatory changes. Spleen: Normal in size without significant abnormality. Adrenals/Urinary Tract: Adrenal glands are unremarkable. Punctuate nonobstructive calculus of the midportion of the left kidney. Thickening of the urinary bladder. Stomach/Bowel: Stomach is within normal limits. Appendix appears normal. No evidence of bowel wall thickening, distention, or inflammatory changes. Vascular/Lymphatic: Scattered calcific atherosclerosis. No enlarged abdominal or pelvic lymph nodes. Reproductive: No mass or other significant abnormality. Other: No abdominal wall hernia or abnormality. No abdominopelvic ascites. Musculoskeletal: No acute or significant osseous findings. IMPRESSION: 1.  Hepatomegaly. 2. Thickening of the urinary bladder, suggestive of infectious or inflammatory cystitis. Correlate with urinalysis. 3. Punctuate nonobstructive calculus of the midportion of the left kidney. No right-sided or ureteral calculus. No hydronephrosis. Electronically  Signed   By: Eddie Candle M.D.   On: 09/04/2018 16:03    Assessment/Plan: Principal Problem:   RUQ abdominal pain Active Problems:   Hepatomegaly   Elevated BP without diagnosis of hypertension    This patient was discussed with the ED physician, including pertinent vitals, physical exam findings, labs, and imaging.  We also discussed care given by the ED provider.  1. Right upper quadrant pain a. Observation b. Pain control c. N.p.o. after  midnight d. Clear liquids until then e. Ultrasound in the morning f. Repeat metabolic panel and CBC. 2. Hepatomegaly a. Acute hepatic panel pending 3. Hypertension a. 1 start lisinopril  DVT prophylaxis: SCDs Consultants: Neurosurgery Code Status: Full code Family Communication:   Disposition Plan: Patient should be able to return home tomorrow   Truett Mainland, DO

## 2018-09-05 ENCOUNTER — Encounter (HOSPITAL_COMMUNITY): Payer: Self-pay | Admitting: General Surgery

## 2018-09-05 ENCOUNTER — Observation Stay (HOSPITAL_COMMUNITY): Payer: Medicaid Other

## 2018-09-05 DIAGNOSIS — R112 Nausea with vomiting, unspecified: Secondary | ICD-10-CM

## 2018-09-05 DIAGNOSIS — R197 Diarrhea, unspecified: Secondary | ICD-10-CM | POA: Diagnosis not present

## 2018-09-05 DIAGNOSIS — K760 Fatty (change of) liver, not elsewhere classified: Secondary | ICD-10-CM | POA: Diagnosis not present

## 2018-09-05 DIAGNOSIS — R1011 Right upper quadrant pain: Secondary | ICD-10-CM | POA: Diagnosis not present

## 2018-09-05 DIAGNOSIS — R160 Hepatomegaly, not elsewhere classified: Secondary | ICD-10-CM | POA: Diagnosis not present

## 2018-09-05 DIAGNOSIS — K811 Chronic cholecystitis: Secondary | ICD-10-CM | POA: Diagnosis not present

## 2018-09-05 DIAGNOSIS — R030 Elevated blood-pressure reading, without diagnosis of hypertension: Secondary | ICD-10-CM | POA: Diagnosis not present

## 2018-09-05 LAB — RAPID URINE DRUG SCREEN, HOSP PERFORMED
Amphetamines: NOT DETECTED
Barbiturates: NOT DETECTED
Benzodiazepines: NOT DETECTED
Cocaine: NOT DETECTED
Opiates: POSITIVE — AB
Tetrahydrocannabinol: NOT DETECTED

## 2018-09-05 LAB — BASIC METABOLIC PANEL
Anion gap: 8 (ref 5–15)
BUN: 11 mg/dL (ref 6–20)
CO2: 29 mmol/L (ref 22–32)
Calcium: 8.8 mg/dL — ABNORMAL LOW (ref 8.9–10.3)
Chloride: 106 mmol/L (ref 98–111)
Creatinine, Ser: 0.73 mg/dL (ref 0.44–1.00)
GFR calc Af Amer: >60 mL/min (ref >60)
GFR calc non Af Amer: >60 mL/min (ref >60)
Glucose, Bld: 97 mg/dL (ref 70–99)
Potassium: 4.1 mmol/L (ref 3.5–5.1)
Sodium: 143 mmol/L (ref 135–145)

## 2018-09-05 LAB — CBC
HCT: 39.3 % (ref 36.0–46.0)
Hemoglobin: 12.2 g/dL (ref 12.0–15.0)
MCH: 26.6 pg (ref 26.0–34.0)
MCHC: 31 g/dL (ref 30.0–36.0)
MCV: 85.6 fL (ref 80.0–100.0)
Platelets: 249 10*3/uL (ref 150–400)
RBC: 4.59 MIL/uL (ref 3.87–5.11)
RDW: 14.4 % (ref 11.5–15.5)
WBC: 7.4 10*3/uL (ref 4.0–10.5)
nRBC: 0 % (ref 0.0–0.2)

## 2018-09-05 LAB — SURGICAL PCR SCREEN
MRSA, PCR: NEGATIVE
Staphylococcus aureus: POSITIVE — AB

## 2018-09-05 LAB — GLUCOSE, CAPILLARY: Glucose-Capillary: 87 mg/dL (ref 70–99)

## 2018-09-05 MED ORDER — ENOXAPARIN SODIUM 60 MG/0.6ML ~~LOC~~ SOLN
55.0000 mg | SUBCUTANEOUS | Status: DC
  Administered 2018-09-05 – 2018-09-08 (×4): 55 mg via SUBCUTANEOUS
  Filled 2018-09-05 (×4): qty 0.6

## 2018-09-05 MED ORDER — CHLORHEXIDINE GLUCONATE CLOTH 2 % EX PADS
6.0000 | MEDICATED_PAD | Freq: Every day | CUTANEOUS | Status: DC
  Administered 2018-09-05 – 2018-09-08 (×3): 6 via TOPICAL

## 2018-09-05 MED ORDER — MUPIROCIN 2 % EX OINT
1.0000 "application " | TOPICAL_OINTMENT | Freq: Two times a day (BID) | CUTANEOUS | Status: DC
  Administered 2018-09-05 – 2018-09-08 (×7): 1 via NASAL
  Filled 2018-09-05 (×2): qty 22

## 2018-09-05 MED ORDER — PANTOPRAZOLE SODIUM 40 MG IV SOLR
40.0000 mg | Freq: Two times a day (BID) | INTRAVENOUS | Status: DC
  Administered 2018-09-05 – 2018-09-07 (×4): 40 mg via INTRAVENOUS
  Filled 2018-09-05 (×4): qty 40

## 2018-09-05 MED ORDER — SUCRALFATE 1 GM/10ML PO SUSP
1.0000 g | Freq: Three times a day (TID) | ORAL | Status: DC
  Administered 2018-09-05 – 2018-09-08 (×8): 1 g via ORAL
  Filled 2018-09-05 (×8): qty 10

## 2018-09-05 MED ORDER — PANTOPRAZOLE SODIUM 40 MG IV SOLR
40.0000 mg | INTRAVENOUS | Status: DC
  Administered 2018-09-05: 40 mg via INTRAVENOUS
  Filled 2018-09-05: qty 40

## 2018-09-05 MED ORDER — ENOXAPARIN SODIUM 40 MG/0.4ML ~~LOC~~ SOLN: 40.0000 mg | SUBCUTANEOUS | Status: DC

## 2018-09-05 NOTE — Progress Notes (Signed)
PROGRESS NOTE  Quentin MullingKimberly D Funk ZOX:[id number] DOB: October 28, 1970 DOA: 09/04/2018 PCP: Patient, No Pcp Per  Brief History:  48 year old female with a history of anxiety/depression, hypertension, tobacco abuse presenting with 7334-month history of right upper quadrant abdominal pain that has worsened over the past 1 to 2 weeks.  She states that over the past week her abdominal pain has been essentially constant.  Her pain usually is worsened with eating " heavy meals".  She denies any fevers, chills, chest pain, shortness of breath, dysuria, hematuria.  Over the past 1 to 2 days, she has developed nausea and vomiting without blood.  In addition, the patient has been complaining of loose stools after she eating, particularly " heavy food".  She denies any hematochezia, melena.  Over the past 2 to 3 weeks, the patient has been taken daily NSAIDs to help the pain, but it has not relieved it.  As result, the patient presented for further evaluation.  The patient continues to smoke cigarettes and intermittently smokes cannabis.  In the emergency department, the patient was afebrile hemodynamically stable saturating 96% room air.  BMP, LFTs, and CBC were essentially unremarkable.  Lipase was 26.  Urinalysis was negative for pyuria.  CT of the abdomen and pelvis showed hepatomegaly.  There was no cholelithiasis or gallbladder wall thickening.  Spleen was normal.  There was a nonobstructive left renal stone.  There is no bowel obstruction or inflammation.  Assessment/Plan: Intractable abdominal pain -Concerned about chronic cholecystitis/acalculous cholecystitis -Right upper quadrant abdominal pain -Clear liquid diet for now -If ultrasound is normal, plan to pursue HIDA -Start IV Protonix -IV fluids -General surgery consult -09/04/2018 CT abdomen and pelvis as discussed above -Urinalysis negative for pyuria -Urine drug screen -judicious opioids  Hepatomegaly -Suspect underlying NASH -hep b surface  antigen -hep C antibody  Tobacco abuse I have discussed tobacco cessation with the patient.  I have counseled the patient regarding the negative impacts of continued tobacco use including but not limited to lung cancer, COPD, and cardiovascular disease.  I have discussed alternatives to tobacco and modalities that may help facilitate tobacco cessation including but not limited to biofeedback, hypnosis, and medications.  Total time spent with tobacco counseling was 4 minutes.         Disposition Plan:   Home in 1-2 days  Family Communication:  No Family at bedside  Consultants:  none  Code Status:  FULL  DVT Prophylaxis:  [place] Heparin / [place] Lovenox   Procedures: As Listed in Progress Note Above  Antibiotics: None       Subjective: Patient continues to have nausea without any emesis.  She has not had any stool since admission into the hospital.  She states her abdominal pain is about the same.  She denies any fevers, chills, chest pain, shortness breath, coughing, hemoptysis.  Objective: Vitals:   09/04/18 1915 09/04/18 1930 09/04/18 2000 09/05/18 0515  BP:  125/88 (!) 141/101 103/61  Pulse: 79 74 76 73  Resp:  19 18 18   Temp:   98.8 F (37.1 C) 98.2 F (36.8 C)  TempSrc:   Oral Oral  SpO2: 95% 94% 98% 96%  Weight:   109.6 kg   Height:        Intake/Output Summary (Last 24 hours) at 09/05/2018 0816 Last data filed at 09/05/2018 0506 Gross per 24 hour  Intake 1872.88 ml  Output -  Net 1872.88 ml   Weight change:  Exam:  General:  Pt is alert, follows commands appropriately, not in acute distress  HEENT: No icterus, No thrush, No neck mass, [place]/AT  Cardiovascular: RRR, S1/S2, no rubs, no gallops  Respiratory: CTA bilaterally, no wheezing, no crackles, no rhonchi  Abdomen: Soft/+BS, RUQ tender, non distended, no guarding  Extremities: Nonpitting edema, No lymphangitis, No petechiae, No rashes, no synovitis   Data Reviewed: I have personally reviewed  following labs and imaging studies Basic Metabolic Panel: Recent Labs  Lab 09/04/18 1431  NA 141  K 3.9  CL 102  CO2 27  GLUCOSE 96  BUN 14  CREATININE 0.82  CALCIUM 9.3   Liver Function Tests: Recent Labs  Lab 09/04/18 1431  AST 15  ALT 18  ALKPHOS 82  BILITOT 0.5  PROT 7.8  ALBUMIN 4.6   Recent Labs  Lab 09/04/18 1431  LIPASE 26   No results for input(s): AMMONIA in the last 168 hours. Coagulation Profile: No results for input(s): INR, PROTIME in the last 168 hours. CBC: Recent Labs  Lab 09/04/18 1431 09/05/18 0740  WBC 9.6 7.4  NEUTROABS 6.4  --   HGB 14.0 12.2  HCT 42.9 39.3  MCV 84.3 85.6  PLT 298 249   Cardiac Enzymes: No results for input(s): CKTOTAL, CKMB, CKMBINDEX, TROPONINI in the last 168 hours. BNP: Invalid input(s): POCBNP CBG: No results for input(s): GLUCAP in the last 168 hours. HbA1C: No results for input(s): HGBA1C in the last 72 hours. Urine analysis:    Component Value Date/Time   COLORURINE YELLOW 09/04/2018 1431   APPEARANCEUR HAZY (A) 09/04/2018 1431   LABSPEC 1.025 09/04/2018 1431   PHURINE 5.0 09/04/2018 1431   GLUCOSEU NEGATIVE 09/04/2018 1431   HGBUR NEGATIVE 09/04/2018 1431   BILIRUBINUR NEGATIVE 09/04/2018 1431   KETONESUR NEGATIVE 09/04/2018 1431   PROTEINUR NEGATIVE 09/04/2018 1431   UROBILINOGEN 0.2 04/08/2008 1128   NITRITE NEGATIVE 09/04/2018 1431   LEUKOCYTESUR NEGATIVE 09/04/2018 1431   Sepsis Labs: @LABRCNTIP (procalcitonin:4,lacticidven:4) ) Recent Results (from the past 240 hour(s))  SARS Coronavirus 2 (CEPHEID - Performed in [facility] hospital lab), Hosp Order     Status: None   Collection Time: 09/04/18  5:20 PM  Result Value Ref Range Status   SARS Coronavirus 2 NEGATIVE NEGATIVE Final    Comment: (NOTE) If result is NEGATIVE SARS-CoV-2 target nucleic acids are NOT DETECTED. The SARS-CoV-2 RNA is generally detectable in upper and lower  respiratory specimens during the acute phase of  infection. The lowest  concentration of SARS-CoV-2 viral copies this assay can detect is 250  copies / mL. A negative result does not preclude SARS-CoV-2 infection  and should not be used as the sole basis for treatment or other  patient management decisions.  A negative result may occur with  improper specimen collection / handling, submission of specimen other  than nasopharyngeal swab, presence of viral mutation(s) within the  areas targeted by this assay, and inadequate number of viral copies  (<250 copies / mL). A negative result must be combined with clinical  observations, patient history, and epidemiological information. If result is POSITIVE SARS-CoV-2 target nucleic acids are DETECTED. The SARS-CoV-2 RNA is generally detectable in upper and lower  respiratory specimens dur ing the acute phase of infection.  Positive  results are indicative of active infection with SARS-CoV-2.  Clinical  correlation with patient history and other diagnostic information is  necessary to determine patient infection status.  Positive results do  not rule out bacterial infection or co-infection with other  viruses. If result is PRESUMPTIVE POSTIVE SARS-CoV-2 nucleic acids MAY BE PRESENT.   A presumptive positive result was obtained on the submitted specimen  and confirmed on repeat testing.  While 2019 novel coronavirus  (SARS-CoV-2) nucleic acids may be present in the submitted sample  additional confirmatory testing may be necessary for epidemiological  and / or clinical management purposes  to differentiate between  SARS-CoV-2 and other Sarbecovirus currently known to infect humans.  If clinically indicated additional testing with an alternate test  methodology [phone] is advised. The SARS-CoV-2 RNA is generally  detectable in upper and lower respiratory sp ecimens during the acute  phase of infection. The expected result is Negative. Fact Sheet for Patients:   BoilerBrush.com.cyhttps://www.fda.gov/media/136312/download Fact Sheet for Healthcare Providers: https://pope.com/https://www.fda.gov/media/136313/download This test is not yet approved or cleared by the [place] FDA and has been authorized for detection and/or diagnosis of SARS-CoV-2 by FDA under an Emergency Use Authorization (EUA).  This EUA will remain in effect (meaning this test can be used) for the duration of the COVID-19 declaration under Section 564(b)(1) of the Act, 21 U.S.C. section 360bbb-3(b)(1), unless the authorization is terminated or revoked sooner. Performed at [facility], [address] [place], [place] [address]   Surgical pcr screen     Status: Abnormal   Collection Time: 09/05/18  5:02 AM  Result Value Ref Range Status   MRSA, PCR NEGATIVE NEGATIVE Final   Staphylococcus aureus POSITIVE (A) NEGATIVE Final    Comment: RESULT CALLED TO, READ BACK BY AND VERIFIED WITH: HOWERTON M. @ [id number] ON [id number] BY HENDERSON L. (NOTE) The Xpert SA Assay (FDA approved for NASAL specimens in patients 49 years of age and older), is one component of a comprehensive surveillance program. It is not intended to diagnose infection nor to guide or monitor treatment. Performed at [facility], [address] [place], [place] [address]      Scheduled Meds: . lisinopril  10 mg Oral Daily  . nicotine  14 mg Transdermal Daily  . pantoprazole (PROTONIX) IV  40 mg Intravenous Q24H   Continuous Infusions: . sodium chloride 100 mL/hr at 09/05/18 0506    Procedures/Studies: Ct Abdomen Pelvis W Contrast  Result Date: 09/04/2018 CLINICAL DATA:  Right flank pain EXAM: CT ABDOMEN AND PELVIS WITH CONTRAST TECHNIQUE: Multidetector CT imaging of the abdomen and pelvis was performed using the standard protocol following bolus administration of intravenous contrast. CONTRAST:  100mL OMNIPAQUE IOHEXOL 300 MG/ML  SOLN COMPARISON:  03/15/2008 FINDINGS: Lower chest: No acute abnormality. Hepatobiliary: Hepatomegaly, maximum  coronal span 24 cm. No solid liver abnormality is seen. No gallstones, gallbladder wall thickening, or biliary dilatation. Pancreas: Unremarkable. No pancreatic ductal dilatation or surrounding inflammatory changes. Spleen: Normal in size without significant abnormality. Adrenals/Urinary Tract: Adrenal glands are unremarkable. Punctuate nonobstructive calculus of the midportion of the left kidney. Thickening of the urinary bladder. Stomach/Bowel: Stomach is within normal limits. Appendix appears normal. No evidence of bowel wall thickening, distention, or inflammatory changes. Vascular/Lymphatic: Scattered calcific atherosclerosis. No enlarged abdominal or pelvic lymph nodes. Reproductive: No mass or other significant abnormality. Other: No abdominal wall hernia or abnormality. No abdominopelvic ascites. Musculoskeletal: No acute or significant osseous findings. IMPRESSION: 1.  Hepatomegaly. 2. Thickening of the urinary bladder, suggestive of infectious or inflammatory cystitis. Correlate with urinalysis. 3. Punctuate nonobstructive calculus of the midportion of the left kidney. No right-sided or ureteral calculus. No hydronephrosis. Electronically Signed   By: Lauralyn PrimesAlex  Bibbey M.D.   On: 09/04/2018 16:03    Onalee Huaavid  Elveria Lauderbaugh, DO  [facility] Pager [phone]  If 7PM-7AM, please contact night-coverage [email or web address] Password [email or web address] 09/05/2018, 8:16 AM   LOS: 0 days

## 2018-09-05 NOTE — Consult Note (Addendum)
[facility] Consult  Reason for Consult: RUQ Pain  Referring Physician:  Dr. Arbutus Leasat MD  Chief Complaint    Flank Pain      Leslie Lowery is a 48 y.o. female.  HPI: Leslie Lowery is a 48 yo who has been having 6+ months of RUQ and epigastric pain that she says has been getting progressively worse. She says the pain is sharp and intense, and that it is made worse with fatty foods initially. Now she has issues with more types of food. She says that she feels nauseated and bloated and does vomit on occasion.  She came into the ED due to the pain worsening, and had not sought out medical attention prior due to lack of insurance.  She was just bearing with the pain.  She does take 600 mg of Ibuprofen TID and some BC powder for the pain.  She says she does have some heartburn symptoms.   She says that her pain is about the same at this time, and that she is ready to figure something out.  She was worked up in the ED with a CT and was noted to have hepatomegaly but no gallstones and normal LFTs.  US this AM demonstrated no gallstones or concerning finding. She does have concern for steatosis.   Past Medical History:  Diagnosis Date  . Anxiety   . Disc disease, degenerative, cervical   . Hypertension     Past Surgical History:  Procedure Laterality Date  . CESAREAN SECTION      Family History  Problem Relation Age of Onset  . Diabetes Mother   . CAD Mother   . CAD Father   . Diabetes Father   . Throat cancer Father     Social History   Tobacco Use  . Smoking status: Current Every Day Smoker    Types: Cigarettes  . Smokeless tobacco: Never Used  Substance Use Topics  . Alcohol use: No  . Drug use: Yes    Types: Marijuana    Medications:  I have reviewed the patient's current medications. Prior to Admission:  Medications Prior to Admission  Medication Sig Dispense Refill Last Dose  . acetaminophen (TYLENOL) 500 MG tablet Take 500 mg by mouth every 6 (six)  hours as needed for mild pain or moderate pain.   09/04/2018 at Unknown time  . ibuprofen (ADVIL) 200 MG tablet Take 200 mg by mouth every 6 (six) hours as needed for mild pain or moderate pain.   09/04/2018 at Unknown time   Scheduled: . Chlorhexidine Gluconate Cloth  6 each Topical Q0600  . enoxaparin (LOVENOX) injection  55 mg Subcutaneous Q24H  . mupirocin ointment  1 application Nasal BID  . nicotine  14 mg Transdermal Daily  . pantoprazole (PROTONIX) IV  40 mg Intravenous Q12H  . sucralfate  1 g Oral TID WC & HS   Continuous: . sodium chloride 100 mL/hr at 09/05/18 0506   ZOX:[id number]:hydrOXYzine, morphine injection, ondansetron **OR** ondansetron (ZOFRAN) IV  No Known Allergies  ROS:  A comprehensive review of systems was negative except for: Gastrointestinal: positive for abdominal pain, nausea, reflux symptoms, vomiting and bloating  Blood pressure 103/61, pulse 73, temperature 98.2 F (36.8 C), temperature source Oral, resp. rate 18, height 5\' 3"  (1.6 m), weight 109.6 kg, SpO2 96 %. Physical Exam Vitals signs reviewed.  Constitutional:      Appearance: Normal appearance.  HENT:     Head: Normocephalic.     Nose: Nose  normal.     Mouth/Throat:     Mouth: Mucous membranes are moist.  Eyes:     Pupils: Pupils are equal, round, and reactive to light.  Neck:     Musculoskeletal: Normal range of motion.  Cardiovascular:     Rate and Rhythm: Normal rate.  Pulmonary:     Effort: Pulmonary effort is normal.  Abdominal:     General: There is no distension.     Palpations: Abdomen is soft.     Tenderness: There is abdominal tenderness in the right upper quadrant and epigastric area.  Musculoskeletal: Normal range of motion.        General: No swelling.  Skin:    General: Skin is warm and dry.  Neurological:     General: No focal deficit present.     Mental Status: She is alert and oriented to person, place, and time.  Psychiatric:        Mood and Affect: Mood normal.         Behavior: Behavior normal.        Thought Content: Thought content normal.        Judgment: Judgment normal.     Results: Results for orders placed or performed during the hospital encounter of 09/04/18 (from the past 48 hour(s))  CBC with Differential     Status: None   Collection Time: 09/04/18  2:31 PM  Result Value Ref Range   WBC 9.6 4.0 - 10.5 K/uL   RBC 5.09 3.87 - 5.11 MIL/uL   Hemoglobin 14.0 12.0 - 15.0 g/dL   HCT 16.142.9 09.636.0 - 04.546.0 %   MCV 84.3 80.0 - 100.0 fL   MCH 27.5 26.0 - 34.0 pg   MCHC 32.6 30.0 - 36.0 g/dL   RDW 40.914.1 81.111.5 - 91.415.5 %   Platelets 298 150 - 400 K/uL   nRBC 0.0 0.0 - 0.2 %   Neutrophils Relative % 67 %   Neutro Abs 6.4 1.7 - 7.7 K/uL   Lymphocytes Relative 25 %   Lymphs Abs 2.3 0.7 - 4.0 K/uL   Monocytes Relative 5 %   Monocytes Absolute 0.5 0.1 - 1.0 K/uL   Eosinophils Relative 2 %   Eosinophils Absolute 0.2 0.0 - 0.5 K/uL   Basophils Relative 1 %   Basophils Absolute 0.1 0.0 - 0.1 K/uL   Immature Granulocytes 0 %   Abs Immature Granulocytes 0.02 0.00 - 0.07 K/uL    Comment: Performed at [facility], [address] [place], [place] [address]  Comprehensive metabolic panel     Status: None   Collection Time: 09/04/18  2:31 PM  Result Value Ref Range   Sodium 141 135 - 145 mmol/L   Potassium 3.9 3.5 - 5.1 mmol/L   Chloride 102 98 - 111 mmol/L   CO2 27 22 - 32 mmol/L   Glucose, Bld 96 70 - 99 mg/dL   BUN 14 6 - 20 mg/dL   Creatinine, Ser 6.210.82 0.44 - 1.00 mg/dL   Calcium 9.3 8.9 - 30.810.3 mg/dL   Total Protein 7.8 6.5 - 8.1 g/dL   Albumin 4.6 3.5 - 5.0 g/dL   AST 15 15 - 41 U/L   ALT 18 0 - 44 U/L   Alkaline Phosphatase 82 38 - 126 U/L   Total Bilirubin 0.5 0.3 - 1.2 mg/dL   GFR calc non Af Amer >60 >60 mL/min   GFR calc Af Amer >60 >60 mL/min   Anion gap 12 5 - 15  Comment: Performed at [facility], [address] [place], [place] [address]  Lipase, blood     Status: None   Collection Time: 09/04/18  2:31 PM  Result Value Ref Range    Lipase 26 11 - 51 U/L    Comment: Performed at [facility] [place], [address], [place], [place] [address]  Urinalysis, Routine w reflex microscopic     Status: Abnormal   Collection Time: 09/04/18  2:31 PM  Result Value Ref Range   Color, Urine YELLOW YELLOW   APPearance HAZY (A) CLEAR   Specific Gravity, Urine 1.025 1.005 - 1.030   pH 5.0 5.0 - 8.0   Glucose, UA NEGATIVE NEGATIVE mg/dL   Hgb urine dipstick NEGATIVE NEGATIVE   Bilirubin Urine NEGATIVE NEGATIVE   Ketones, ur NEGATIVE NEGATIVE mg/dL   Protein, ur NEGATIVE NEGATIVE mg/dL   Nitrite NEGATIVE NEGATIVE   Leukocytes,Ua NEGATIVE NEGATIVE    Comment: Performed at [facility], [address] [place], [place] [address]  Pregnancy, urine     Status: None   Collection Time: 09/04/18  2:31 PM  Result Value Ref Range   Preg Test, Ur NEGATIVE NEGATIVE    Comment:        THE SENSITIVITY OF THIS METHODOLOGY IS >20 mIU/mL. Performed at [facility], [address], [place], [place] [address]   SARS Coronavirus 2 (CEPHEID - Performed in [facility] hospital lab), Hosp Order     Status: None   Collection Time: 09/04/18  5:20 PM  Result Value Ref Range   SARS Coronavirus 2 NEGATIVE NEGATIVE    Comment: (NOTE) If result is NEGATIVE SARS-CoV-2 target nucleic acids are NOT DETECTED. The SARS-CoV-2 RNA is generally detectable in upper and lower  respiratory specimens during the acute phase of infection. The lowest  concentration of SARS-CoV-2 viral copies this assay can detect is 250  copies / mL. A negative result does not preclude SARS-CoV-2 infection  and should not be used as the sole basis for treatment or other  patient management decisions.  A negative result may occur with  improper specimen collection / handling, submission of specimen other  than nasopharyngeal swab, presence of viral mutation(s) within the  areas targeted by this assay, and inadequate number of viral copies  (<250 copies / mL). A negative result must be  combined with clinical  observations, patient history, and epidemiological information. If result is POSITIVE SARS-CoV-2 target nucleic acids are DETECTED. The SARS-CoV-2 RNA is generally detectable in upper and lower  respiratory specimens dur ing the acute phase of infection.  Positive  results are indicative of active infection with SARS-CoV-2.  Clinical  correlation with patient history and other diagnostic information is  necessary to determine patient infection status.  Positive results do  not rule out bacterial infection or co-infection with other viruses. If result is PRESUMPTIVE POSTIVE SARS-CoV-2 nucleic acids MAY BE PRESENT.   A presumptive positive result was obtained on the submitted specimen  and confirmed on repeat testing.  While 2019 novel coronavirus  (SARS-CoV-2) nucleic acids may be present in the submitted sample  additional confirmatory testing may be necessary for epidemiological  and / or clinical management purposes  to differentiate between  SARS-CoV-2 and other Sarbecovirus currently known to infect humans.  If clinically indicated additional testing with an alternate test  methodology [phone]) is advised. The SARS-CoV-2 RNA is generally  detectable in upper and lower respiratory sp ecimens during the acute  phase of infection. The expected result is Negative.  Fact Sheet for Patients:  StrictlyIdeas.no Fact Sheet for Healthcare Providers: BankingDealers.co.za This test is not yet approved or cleared by the [place] FDA and has been authorized for detection and/or diagnosis of SARS-CoV-2 by FDA under an Emergency Use Authorization (EUA).  This EUA will remain in effect (meaning this test can be used) for the duration of the COVID-19 declaration under Section 564(b)(1) of the Act, 21 U.S.C. section 360bbb-3(b)(1), unless the authorization is terminated or revoked sooner. Performed at [facility],  [address] [place], [place] [address]   Surgical pcr screen     Status: Abnormal   Collection Time: 09/05/18  5:02 AM  Result Value Ref Range   MRSA, PCR NEGATIVE NEGATIVE   Staphylococcus aureus POSITIVE (A) NEGATIVE    Comment: RESULT CALLED TO, READ BACK BY AND VERIFIED WITH: HOWERTON M. @ 0812 ON [id number] BY HENDERSON L. (NOTE) The Xpert SA Assay (FDA approved for NASAL specimens in patients 42 years of age and older), is one component of a comprehensive surveillance program. It is not intended to diagnose infection nor to guide or monitor treatment. Performed at [facility], [address] [place], [place] [address]   CBC     Status: None   Collection Time: 09/05/18  7:40 AM  Result Value Ref Range   WBC 7.4 4.0 - 10.5 K/uL   RBC 4.59 3.87 - 5.11 MIL/uL   Hemoglobin 12.2 12.0 - 15.0 g/dL   HCT 39.3 36.0 - 46.0 %   MCV 85.6 80.0 - 100.0 fL   MCH 26.6 26.0 - 34.0 pg   MCHC 31.0 30.0 - 36.0 g/dL   RDW 14.4 11.5 - 15.5 %   Platelets 249 150 - 400 K/uL   nRBC 0.0 0.0 - 0.2 %    Comment: Performed at [facility], [address], [place], [place] [address]  Basic metabolic panel     Status: Abnormal   Collection Time: 09/05/18  7:40 AM  Result Value Ref Range   Sodium 143 135 - 145 mmol/L   Potassium 4.1 3.5 - 5.1 mmol/L   Chloride 106 98 - 111 mmol/L   CO2 29 22 - 32 mmol/L   Glucose, Bld 97 70 - 99 mg/dL   BUN 11 6 - 20 mg/dL   Creatinine, Ser 0.73 0.44 - 1.00 mg/dL   Calcium 8.8 (L) 8.9 - 10.3 mg/dL   GFR calc non Af Amer >60 >60 mL/min   GFR calc Af Amer >60 >60 mL/min   Anion gap 8 5 - 15    Comment: Performed at [facility], [address], [place], [place] [address]  Urine rapid drug screen (hosp performed)     Status: Abnormal   Collection Time: 09/05/18  8:29 AM  Result Value Ref Range   Opiates POSITIVE (A) NONE DETECTED   Cocaine NONE DETECTED NONE DETECTED   Benzodiazepines NONE DETECTED NONE DETECTED   Amphetamines NONE DETECTED NONE DETECTED    Tetrahydrocannabinol NONE DETECTED NONE DETECTED   Barbiturates NONE DETECTED NONE DETECTED    Comment: (NOTE) DRUG SCREEN FOR MEDICAL PURPOSES ONLY.  IF CONFIRMATION IS NEEDED FOR ANY PURPOSE, NOTIFY LAB WITHIN 5 DAYS. LOWEST DETECTABLE LIMITS FOR URINE DRUG SCREEN Drug Class                     Cutoff (ng/mL) Amphetamine and metabolites    1000 Barbiturate and metabolites    200 Benzodiazepine  200 Tricyclics and metabolites     300 Opiates and metabolites        300 Cocaine and metabolites        300 THC                            50 Performed at [facility], [address], [place], [place] [address]    Personally reviewed CT- large right lobe of the liver, gallbladder distended but no stones noted, no biliary distention   Ct Abdomen Pelvis W Contrast  Result Date: 09/04/2018 CLINICAL DATA:  Right flank pain EXAM: CT ABDOMEN AND PELVIS WITH CONTRAST TECHNIQUE: Multidetector CT imaging of the abdomen and pelvis was performed using the standard protocol following bolus administration of intravenous contrast. CONTRAST:  OMNIPAQUE IOHEXOL 300 MG/ML  SOLN COMPARISON:  03/15/2008 FINDINGS: Lower chest: No acute abnormality. Hepatobiliary: Hepatomegaly, maximum coronal span 24 cm. No solid liver abnormality is seen. No gallstones, gallbladder wall thickening, or biliary dilatation. Pancreas: Unremarkable. No pancreatic ductal dilatation or surrounding inflammatory changes. Spleen: Normal in size without significant abnormality. Adrenals/Urinary Tract: Adrenal glands are unremarkable. Punctuate nonobstructive calculus of the midportion of the left kidney. Thickening of the urinary bladder. Stomach/Bowel: Stomach is within normal limits. Appendix appears normal. No evidence of bowel wall thickening, distention, or inflammatory changes. Vascular/Lymphatic: Scattered calcific atherosclerosis. No enlarged abdominal or pelvic lymph nodes. Reproductive: No mass or other significant  abnormality. Other: No abdominal wall hernia or abnormality. No abdominopelvic ascites. Musculoskeletal: No acute or significant osseous findings. IMPRESSION: 1.  Hepatomegaly. 2. Thickening of the urinary bladder, suggestive of infectious or inflammatory cystitis. Correlate with urinalysis. 3. Punctuate nonobstructive calculus of the midportion of the left kidney. No right-sided or ureteral calculus. No hydronephrosis. Electronically Signed   By: Lauralyn Primes M.D.   On: 09/04/2018 16:03   US Abdomen Limited Ruq  Result Date: 09/05/2018 CLINICAL DATA:  Right upper quadrant abdominal pain. EXAM: ULTRASOUND ABDOMEN LIMITED RIGHT UPPER QUADRANT COMPARISON:  CT of 1 day prior FINDINGS: Gallbladder: No gallstones or wall thickening visualized. No sonographic Murphy sign noted by sonographer. Common bile duct: Diameter: Normal, 3 mm. Liver: Suspect mild increased echogenicity as can be seen with steatosis. Portal vein is patent on color Doppler imaging with normal direction of blood flow towards the liver. IMPRESSION: 1.  No acute process or explanation for right upper quadrant pain. 2. Probable hepatic steatosis. Electronically Signed   By: Jeronimo Greaves M.D.   On: 09/05/2018 11:37    Assessment & Plan:  Leslie Lowery is a 48 y.o. female with RUQ pain and some epigastric pain. She has symptoms that sound like her gallbladder but she also takes significant amount of NSAIDs. Also with findings concerning for NASH on [place] and hepatomegaly.  Patient has been having this pain for months and is progressing.   -Agree with HIDA -Ordered Soft diet, grilled cheese ok to have, NPO at midnight, patient has been having symptoms and dealing with this for months, so diet is fine  -Protonix increased to BID, Carafate ordered QID given risk for gastritis, ulcer related to NSAIDs -We discussed that she could have something causing her pain related to the gallbladder, related to her stomach or both. We discussed that the HIDA  Could come back negative, and that we would discuss if her symptoms are severe enough to proceed under the assumption of chronic cholecystitis, and we discussed the possibility of no improvement and need for  an EGD to rule out ulcers. She expressed understanding. -HIDA in AM and will go from there after results   All questions were answered to the satisfaction of the patient.   Lucretia RoersLindsay C Bridges 09/05/2018, 12:46 PM

## 2018-09-06 ENCOUNTER — Encounter (HOSPITAL_COMMUNITY): Payer: Self-pay | Admitting: Gastroenterology

## 2018-09-06 ENCOUNTER — Inpatient Hospital Stay (HOSPITAL_COMMUNITY): Payer: Medicaid Other

## 2018-09-06 ENCOUNTER — Observation Stay (HOSPITAL_COMMUNITY): Payer: Medicaid Other

## 2018-09-06 DIAGNOSIS — R112 Nausea with vomiting, unspecified: Secondary | ICD-10-CM | POA: Diagnosis not present

## 2018-09-06 DIAGNOSIS — R160 Hepatomegaly, not elsewhere classified: Secondary | ICD-10-CM | POA: Diagnosis not present

## 2018-09-06 DIAGNOSIS — K760 Fatty (change of) liver, not elsewhere classified: Secondary | ICD-10-CM | POA: Diagnosis not present

## 2018-09-06 DIAGNOSIS — R1011 Right upper quadrant pain: Secondary | ICD-10-CM | POA: Diagnosis not present

## 2018-09-06 DIAGNOSIS — R030 Elevated blood-pressure reading, without diagnosis of hypertension: Secondary | ICD-10-CM | POA: Diagnosis not present

## 2018-09-06 DIAGNOSIS — R197 Diarrhea, unspecified: Secondary | ICD-10-CM | POA: Diagnosis not present

## 2018-09-06 DIAGNOSIS — I10 Essential (primary) hypertension: Secondary | ICD-10-CM | POA: Diagnosis present

## 2018-09-06 DIAGNOSIS — Z6837 Body mass index (BMI) 37.0-37.9, adult: Secondary | ICD-10-CM | POA: Diagnosis not present

## 2018-09-06 DIAGNOSIS — K829 Disease of gallbladder, unspecified: Secondary | ICD-10-CM | POA: Diagnosis not present

## 2018-09-06 DIAGNOSIS — F17210 Nicotine dependence, cigarettes, uncomplicated: Secondary | ICD-10-CM | POA: Diagnosis present

## 2018-09-06 DIAGNOSIS — Z1159 Encounter for screening for other viral diseases: Secondary | ICD-10-CM | POA: Diagnosis not present

## 2018-09-06 DIAGNOSIS — K828 Other specified diseases of gallbladder: Secondary | ICD-10-CM | POA: Diagnosis present

## 2018-09-06 DIAGNOSIS — K811 Chronic cholecystitis: Secondary | ICD-10-CM | POA: Diagnosis not present

## 2018-09-06 DIAGNOSIS — Z01818 Encounter for other preprocedural examination: Secondary | ICD-10-CM | POA: Diagnosis not present

## 2018-09-06 DIAGNOSIS — F419 Anxiety disorder, unspecified: Secondary | ICD-10-CM | POA: Diagnosis present

## 2018-09-06 DIAGNOSIS — K219 Gastro-esophageal reflux disease without esophagitis: Secondary | ICD-10-CM | POA: Diagnosis present

## 2018-09-06 LAB — COMPREHENSIVE METABOLIC PANEL
ALT: 11 U/L (ref 0–44)
AST: 11 U/L — ABNORMAL LOW (ref 15–41)
Albumin: 3.5 g/dL (ref 3.5–5.0)
Alkaline Phosphatase: 59 U/L (ref 38–126)
Anion gap: 7 (ref 5–15)
BUN: 9 mg/dL (ref 6–20)
CO2: 27 mmol/L (ref 22–32)
Calcium: 8.7 mg/dL — ABNORMAL LOW (ref 8.9–10.3)
Chloride: 108 mmol/L (ref 98–111)
Creatinine, Ser: 0.8 mg/dL (ref 0.44–1.00)
GFR calc Af Amer: >60 mL/min (ref >60)
GFR calc non Af Amer: >60 mL/min (ref >60)
Glucose, Bld: 92 mg/dL (ref 70–99)
Potassium: 3.6 mmol/L (ref 3.5–5.1)
Sodium: 142 mmol/L (ref 135–145)
Total Bilirubin: 0.6 mg/dL (ref 0.3–1.2)
Total Protein: 5.9 g/dL — ABNORMAL LOW (ref 6.5–8.1)

## 2018-09-06 LAB — CBC
HCT: 37.6 % (ref 36.0–46.0)
Hemoglobin: 11.6 g/dL — ABNORMAL LOW (ref 12.0–15.0)
MCH: 26.5 pg (ref 26.0–34.0)
MCHC: 30.9 g/dL (ref 30.0–36.0)
MCV: 86 fL (ref 80.0–100.0)
Platelets: 233 10*3/uL (ref 150–400)
RBC: 4.37 MIL/uL (ref 3.87–5.11)
RDW: 13.9 % (ref 11.5–15.5)
WBC: 7.3 10*3/uL (ref 4.0–10.5)
nRBC: 0 % (ref 0.0–0.2)

## 2018-09-06 LAB — HEPATITIS PANEL, ACUTE
HCV Ab: <0.1 s/co ratio (ref 0.0–0.9)
Hep A IgM: NEGATIVE
Hep B C IgM: NEGATIVE
Hepatitis B Surface Ag: NEGATIVE

## 2018-09-06 MED ORDER — SODIUM CHLORIDE 0.9 % IV SOLN
2.0000 g | INTRAVENOUS | Status: AC
  Administered 2018-09-07: 2 g via INTRAVENOUS
  Filled 2018-09-06 (×2): qty 2

## 2018-09-06 MED ORDER — TECHNETIUM TC 99M MEBROFENIN IV KIT
5.0000 | PACK | Freq: Once | INTRAVENOUS | Status: AC | PRN
  Administered 2018-09-06: 4.83 via INTRAVENOUS

## 2018-09-06 MED ORDER — CHLORHEXIDINE GLUCONATE CLOTH 2 % EX PADS
6.0000 | MEDICATED_PAD | Freq: Once | CUTANEOUS | Status: AC
  Administered 2018-09-06: 6 via TOPICAL

## 2018-09-06 MED ORDER — CHLORHEXIDINE GLUCONATE CLOTH 2 % EX PADS: 6.0000 | MEDICATED_PAD | Freq: Once | CUTANEOUS | Status: DC

## 2018-09-06 NOTE — Consult Note (Addendum)
Referring Provider: Dr. Curlene Labrum Primary Care Physician:  Patient, No Pcp Per Primary Gastroenterologist:  Dr. Gala Romney   Date of Admission: 09/04/18 Date of Consultation: 09/06/18  Reason for Consultation:  Hepatomegaly, fatty liver, gastritis from Ibuprofen, RUQ pain   HPI:  Leslie Lowery is a 48 y.o. year old female who presented to the ED on 09/04/2018 with worsening epigastric and RUQ pain. She was evaluated by Dr. Constance Haw in consultation yesterday, who plans on laparoscopic cholecystectomy tomorrow. RUQ ultrasound with probable fatty liver. CT abd/pelvis with hepatomegaly, maximal coronal span 24 cm. Spleen normal in size. HIDA scan with EF 59%. GI consulted due to hepatomegaly, concern for gastritis in setting of NSAIDs. Liver biopsy to be completed at time of cholecystectomy.  6 month history of intermittent abdominal pain. Last few weeks has worsened in frequency and intensity. Stomach feels bloated. Not much of an appetite. Living off of Nab crackers and grilled cheese with butter on it. Settles on her stomach better. Hansel Starling would bother her in the past. Trying to avoid foods that aggravate the pain. Pain in RUQ and epigastric, radiates around to her back. Feels like it goes straight through. Intermittent nausea associated with pain. Emesis has looked like bile. Food tastes "gross". No dysphagia. No alcohol. Ibuprofen 3 tablets TID for stomach over past few weeks. Goody powders one a day for pain. No melena. No hematochezia. No hematemesis.      Past Medical History:  Diagnosis Date  . Anxiety   . Disc disease, degenerative, cervical   . Hypertension     Past Surgical History:  Procedure Laterality Date  . CESAREAN SECTION     X 2    Prior to Admission medications   Medication Sig Start Date End Date Taking? Authorizing Provider  acetaminophen (TYLENOL) 500 MG tablet Take 500 mg by mouth every 6 (six) hours as needed for mild pain or moderate pain.   Yes [provider]  ibuprofen (ADVIL) 200 MG tablet Take 200 mg by mouth every 6 (six) hours as needed for mild pain or moderate pain.   Yes [provider]    Current Facility-Administered Medications  Medication Dose Route Frequency Provider Last Rate Last Dose  . 0.9 %  sodium chloride infusion   Intravenous Continuous Truett Mainland, DO 100 mL/hr at 09/06/18 2353    . [START ON 09/07/2018] cefoTEtan (CEFOTAN) 2 g in sodium chloride 0.9 % 100 mL IVPB  2 g Intravenous On Call to Walford, MD      . Chlorhexidine Gluconate Cloth 2 % PADS 6 each  6 each Topical [id number] Orson Eva, MD   6 each at 09/06/18 0544  . Chlorhexidine Gluconate Cloth 2 % PADS 6 each  6 each Topical Once Virl Cagey, MD       And  . Chlorhexidine Gluconate Cloth 2 % PADS 6 each  6 each Topical Once Virl Cagey, MD      . enoxaparin (LOVENOX) injection 55 mg  55 mg Subcutaneous Q24H Tat, David, MD   55 mg at 09/06/18 0819  . hydrOXYzine (ATARAX/VISTARIL) tablet 25 mg  25 mg Oral TID PRN Truett Mainland, DO   25 mg at 09/06/18 1253  . morphine 4 MG/ML injection 4 mg  4 mg Intravenous Q3H PRN Truett Mainland, DO   4 mg at 09/06/18 1616  . mupirocin ointment (BACTROBAN) 2 % 1 application  1 application Nasal BID Tat, Shanon Brow, MD  1 application at 09/06/18 0818  . nicotine (NICODERM CQ - dosed in mg/24 hours) patch 14 mg  14 mg Transdermal Daily Levie HeritageStinson, Jacob J, DO   14 mg at 09/06/18 [id number]  . ondansetron (ZOFRAN) tablet 4 mg  4 mg Oral Q6H PRN Levie HeritageStinson, Jacob J, DO       Or  . ondansetron [facility](ZOFRAN) injection 4 mg  4 mg Intravenous Q6H PRN Levie HeritageStinson, Jacob J, DO      . pantoprazole (PROTONIX) injection 40 mg  40 mg Intravenous Q12H Lucretia RoersBridges, Lindsay C, MD   40 mg at 09/06/18 0818  . sucralfate (CARAFATE) 1 GM/10ML suspension 1 g  1 g Oral TID WC & HS Lucretia RoersBridges, Lindsay C, MD   1 g at 09/06/18 1616    Allergies as of 09/04/2018  . (No Known Allergies)    Family History  Problem Relation Age of  Onset  . Diabetes Mother   . CAD Mother   . CAD Father   . Diabetes Father   . Throat cancer Father   . Colon cancer Neg Hx   . Colon polyps Neg Hx        no first degree relatives with polyps  . Liver disease Neg Hx     Social History   Socioeconomic History  . Marital status: Single    Spouse name: Not on file  . Number of children: Not on file  . Years of education: Not on file  . Highest education level: Not on file  Occupational History  . Not on file  Social Needs  . Financial resource strain: Not on file  . Food insecurity:    Worry: Not on file    Inability: Not on file  . Transportation needs:    Medical: Not on file    Non-medical: Not on file  Tobacco Use  . Smoking status: Current Every Day Smoker    Packs/day: 1.00    Types: Cigarettes  . Smokeless tobacco: Never Used  Substance and Sexual Activity  . Alcohol use: No  . Drug use: Yes    Types: Marijuana  . Sexual activity: Not on file  Lifestyle  . Physical activity:    Days per week: Not on file    Minutes per session: Not on file  . Stress: Not on file  Relationships  . Social connections:    Talks on phone: Not on file    Gets together: Not on file    Attends religious service: Not on file    Active member of club or organization: Not on file    Attends meetings of clubs or organizations: Not on file    Relationship status: Not on file  . Intimate partner violence:    Fear of current or ex partner: Not on file    Emotionally abused: Not on file    Physically abused: Not on file    Forced sexual activity: Not on file  Other Topics Concern  . Not on file  Social History Narrative  . Not on file    Review of Systems: Gen: Denies fever, chills, loss of appetite, change in weight or weight loss CV: Denies chest pain, heart palpitations, syncope, edema  Resp: Denies shortness of breath with rest, cough, wheezing GI: see HPI GU : Denies urinary burning, urinary frequency, urinary  incontinence.  MS: Denies joint pain,swelling, cramping Derm: Denies rash, itching, dry skin Psych: Denies depression, anxiety,confusion, or memory loss Heme: Denies bruising, bleeding, and enlarged lymph nodes.  Physical Exam: Vital signs in last 24 hours: Temp:  [98 F (36.7 C)-98.5 F (36.9 C)] 98 F (36.7 C) (06/08 1426) Pulse Rate:  [71-74] 72 (06/08 1426) Resp:  [16-18] 17 (06/08 1426) BP: (93-107)/(57-67) 98/61 (06/08 1426) SpO2:  [92 %-97 %] 97 % (06/08 1426) Last BM Date: 09/03/18 General:   Alert,  Well-developed, well-nourished, pleasant and cooperative in NAD Head:  Normocephalic and atraumatic. Eyes:  Sclera clear, no icterus.   Conjunctiva pink. Ears:  Normal auditory acuity. Nose:  No deformity, discharge,  or lesions. Mouth:  No deformity or lesions, dentition normal. Lungs:  Clear throughout to auscultation.   No wheezes, crackles, or rhonchi. No acute distress. Heart:  S1 S2 present without murmurs Abdomen:  Soft, obese,  TTP RUQ and epigastric and nondistended. Unable to assess HSM due to large AP diameter. Normal bowel sounds, without guarding, and without rebound.   Rectal:  Deferred  Msk:  Symmetrical without gross deformities. Normal posture. Extremities:  Without  edema. Neurologic:  Alert and  oriented x4 Skin:  Intact without significant lesions or rashes. Psych:  Alert and cooperative. Normal mood and affect.  Intake/Output from previous day: 06/07 0701 - 06/08 0700 In: 2500 [P.O.:240; I.V.:2260] Out: -  Intake/Output this shift: Total I/O In: 1182.8 [P.O.:480; I.V.:702.8] Out: -   Lab Results: Recent Labs    09/04/18 1431 09/05/18 0740 09/06/18 0624  WBC 9.6 7.4 7.3  HGB 14.0 12.2 11.6*  HCT 42.9 39.3 37.6  PLT 298 249 233   BMET Recent Labs    09/04/18 1431 09/05/18 0740 09/06/18 0624  NA 141 143 142  K 3.9 4.1 3.6  CL 102 106 108  CO2 27 29 27   GLUCOSE 96 97 92  BUN 14 11 9   CREATININE 0.82 0.73 0.80  CALCIUM 9.3 8.8*  8.7*   LFT Recent Labs    09/04/18 1431 09/06/18 0624  PROT 7.8 5.9*  ALBUMIN 4.6 3.5  AST 15 11*  ALT 18 11  ALKPHOS 82 59  BILITOT 0.5 0.6   Hepatitis Panel Recent Labs    09/04/18 1431  HEPBSAG Negative  HCVAB <0.1  HEPAIGM Negative  HEPBIGM Negative    Studies/Results: Nm Hepato W/eject Fract  Result Date: 09/06/2018 CLINICAL DATA:  Nausea and vomiting.  Right upper quadrant pain. EXAM: NUCLEAR MEDICINE HEPATOBILIARY IMAGING WITH GALLBLADDER EF TECHNIQUE: Sequential images of the abdomen were obtained out to 60 minutes following intravenous administration of radiopharmaceutical. After oral ingestion of Ensure, gallbladder ejection fraction was determined. At 60 min, normal ejection fraction is greater than 33%. RADIOPHARMACEUTICALS:  4.83 mCi Tc-131m  Choletec IV COMPARISON:  None. FINDINGS: Prompt uptake and biliary excretion of activity by the liver is seen. Gallbladder activity is visualized, consistent with patency of cystic duct. Biliary activity passes into small bowel, consistent with patent common bile duct. Calculated gallbladder ejection fraction is 59%. (Normal gallbladder ejection fraction with Ensure is greater than 33%.) IMPRESSION: Normal study.  Normal gallbladder ejection fraction. Electronically Signed   By: Gerome Samavid  Williams III M.D   On: 09/06/2018 12:55   [place] Abdomen Limited Ruq  Result Date: 09/05/2018 CLINICAL DATA:  Right upper quadrant abdominal pain. EXAM: ULTRASOUND ABDOMEN LIMITED RIGHT UPPER QUADRANT COMPARISON:  CT of 1 day prior FINDINGS: Gallbladder: No gallstones or wall thickening visualized. No sonographic Murphy sign noted by sonographer. Common bile duct: Diameter: Normal, 3 mm. Liver: Suspect mild increased echogenicity as can be seen with steatosis. Portal vein is patent on color Doppler imaging with  normal direction of blood flow towards the liver. IMPRESSION: 1.  No acute process or explanation for right upper quadrant pain. 2. Probable hepatic  steatosis. Electronically Signed   By: Jeronimo GreavesKyle  Talbot M.D.   On: 09/05/2018 11:37    Impression: 48 year old female with 6 month history of worsening RUQ/epigastric pain worsened postprandially, with plans for cholecystectomy tomorrow with Dr. Henreitta LeberBridges. Findings of fatty liver and hepatomegaly on imaging, with normal LFTs. Hepatomegaly likely due to fatty liver disease. No evidence of enlarged spleen or thrombocytopenia. Liver biopsy to be completed at time of surgery tomorrow.   If pain persists s/p cholecystectomy, we will pursue an EGD as outpatient. She has been taking Ibuprofen up to three times a day and Goody powders at least once a day for the past few weeks. NSAID-induced injury not excluded at this time as underlying. Agree with BID PPI.   Plan: Cholecystectomy tomorrow Liver biopsy at time of surgery Will arrange follow-up in our office to review findings and possibly pursue EGD if persistent pain Protonix BID Avoidance of NSAIDs going forward   Gelene MinkAnna W. Kawhi Diebold, PhD, ANP-BC [facility]    LOS: 0 days    09/06/2018, 4:28 PM

## 2018-09-06 NOTE — Progress Notes (Signed)
[facility] Progress Note     Subjective: No major changes. RUQ with continued tenderness/ pain. Says carafate/ protonix has not helped with the pain. Says her reflux is improved. Tolerated some diet. HIDA done and no signs of acute cholecystitis or decreased EF. Patient with pain with Ensure.   Objective: Vital signs in last 24 hours: Temp:  [98.1 F (36.7 C)-98.5 F (36.9 C)] 98.1 F (36.7 C) (06/08 0527) Pulse Rate:  [71-80] 71 (06/08 0527) Resp:  [16-18] 18 (06/08 0527) BP: (88-107)/(53-67) 93/57 (06/08 0527) SpO2:  [92 %-96 %] 94 % (06/08 0527) Last BM Date: 09/03/18  Intake/Output from previous day: 06/07 0701 - 06/08 0700 In: 2500 [P.O.:240; I.V.:2260] Out: -  Intake/Output this shift: No intake/output data recorded.  General appearance: alert, cooperative and no distress Resp: normal work of breathing GI: soft, upper distention, tender RUQ and epigastric  Lab Results:  Recent Labs    09/05/18 0740 09/06/18 0624  WBC 7.4 7.3  HGB 12.2 11.6*  HCT 39.3 37.6  PLT 249 233   BMET Recent Labs    09/05/18 0740 09/06/18 0624  NA 143 142  K 4.1 3.6  CL 106 108  CO2 29 27  GLUCOSE 97 92  BUN 11 9  CREATININE 0.73 0.80  CALCIUM 8.8* 8.7*   Reviewed imaging- CT with distended gallbladder, HIDA without biliary dyskinesia or acute cholecystitis   Studies/Results: Ct Abdomen Pelvis W Contrast  Result Date: 09/04/2018 CLINICAL DATA:  Right flank pain EXAM: CT ABDOMEN AND PELVIS WITH CONTRAST TECHNIQUE: Multidetector CT imaging of the abdomen and pelvis was performed using the standard protocol following bolus administration of intravenous contrast. CONTRAST:  131mL OMNIPAQUE IOHEXOL 300 MG/ML  SOLN COMPARISON:  03/15/2008 FINDINGS: Lower chest: No acute abnormality. Hepatobiliary: Hepatomegaly, maximum coronal span 24 cm. No solid liver abnormality is seen. No gallstones, gallbladder wall thickening, or biliary dilatation. Pancreas: Unremarkable. No  pancreatic ductal dilatation or surrounding inflammatory changes. Spleen: Normal in size without significant abnormality. Adrenals/Urinary Tract: Adrenal glands are unremarkable. Punctuate nonobstructive calculus of the midportion of the left kidney. Thickening of the urinary bladder. Stomach/Bowel: Stomach is within normal limits. Appendix appears normal. No evidence of bowel wall thickening, distention, or inflammatory changes. Vascular/Lymphatic: Scattered calcific atherosclerosis. No enlarged abdominal or pelvic lymph nodes. Reproductive: No mass or other significant abnormality. Other: No abdominal wall hernia or abnormality. No abdominopelvic ascites. Musculoskeletal: No acute or significant osseous findings. IMPRESSION: 1.  Hepatomegaly. 2. Thickening of the urinary bladder, suggestive of infectious or inflammatory cystitis. Correlate with urinalysis. 3. Punctuate nonobstructive calculus of the midportion of the left kidney. No right-sided or ureteral calculus. No hydronephrosis. Electronically Signed   By: Eddie Candle M.D.   On: 09/04/2018 16:03   Nm Hepato W/eject Fract  Result Date: 09/06/2018 CLINICAL DATA:  Nausea and vomiting.  Right upper quadrant pain. EXAM: NUCLEAR MEDICINE HEPATOBILIARY IMAGING WITH GALLBLADDER EF TECHNIQUE: Sequential images of the abdomen were obtained out to 60 minutes following intravenous administration of radiopharmaceutical. After oral ingestion of Ensure, gallbladder ejection fraction was determined. At 60 min, normal ejection fraction is greater than 33%. RADIOPHARMACEUTICALS:  4.83 mCi Tc-45m  Choletec IV COMPARISON:  None. FINDINGS: Prompt uptake and biliary excretion of activity by the liver is seen. Gallbladder activity is visualized, consistent with patency of cystic duct. Biliary activity passes into small bowel, consistent with patent common bile duct. Calculated gallbladder ejection fraction is 59%. (Normal gallbladder ejection fraction with Ensure is greater  than 33%.) IMPRESSION: Normal study.  Normal gallbladder ejection fraction. Electronically Signed   By: Gerome Samavid  Williams III M.D   On: 09/06/2018 12:55   [place] Abdomen Limited Ruq  Result Date: 09/05/2018 CLINICAL DATA:  Right upper quadrant abdominal pain. EXAM: ULTRASOUND ABDOMEN LIMITED RIGHT UPPER QUADRANT COMPARISON:  CT of 1 day prior FINDINGS: Gallbladder: No gallstones or wall thickening visualized. No sonographic Murphy sign noted by sonographer. Common bile duct: Diameter: Normal, 3 mm. Liver: Suspect mild increased echogenicity as can be seen with steatosis. Portal vein is patent on color Doppler imaging with normal direction of blood flow towards the liver. IMPRESSION: 1.  No acute process or explanation for right upper quadrant pain. 2. Probable hepatic steatosis. Electronically Signed   By: Jeronimo GreavesKyle  Talbot M.D.   On: 09/05/2018 11:37    Assessment/Plan: Leslie Lowery is a 48 yo with continued RUQ pain and bloating. She has been having the pain for some time and goes along with gallbladder type issues with pain worse with fatty meals. Her CT demonstrated a very distended gallbladder but no stones visualized and HIDA negative. We have also discussed her ibuprofen use and her fatty liver. We have discussed that some people still have gallbladder issues despite all of the testing being normal and chronic cholecystitis in sound on the final pathology. We have discussed the option of EGD then Lap chole versus Lap chole and EGD if her symptoms do not improve.  She would like to proceed with the lap chole given the symptoms on the RUQ.    -PLAN: I counseled the patient about the indication, risks and benefits of laparoscopic cholecystectomy.  She understands there is a very small chance for bleeding, infection, injury to normal structures (including common bile duct), conversion to open surgery, persistent symptoms, evolution of postcholecystectomy diarrhea, need for secondary interventions, anesthesia  reaction, cardiopulmonary issues and other risks not specifically detailed here. I described the expected recovery, the plan for follow-up and the restrictions during the recovery phase.  All questions were answered. -Discussed hepatomegaly and fatty liver and obtaining a liver biopsy to help GI with monitoring her fatty liver. We discussed the additional risk of bleeding due to the biopsy. -I think GI could go ahead and weigh in given the patient's hepatomegaly and need for GI follow up as an outpatient and potential need for EGD pending no resolution of her symptoms   Will discuss with Dr. Arbutus Leasat.  Posted for 12 noon 09/07/18.    LOS: 0 days    Leslie Lowery 09/06/2018

## 2018-09-06 NOTE — Progress Notes (Signed)
PROGRESS NOTE  TRENT THEISEN WUJ:[id number] DOB: 01-Sep-1970 DOA: 09/04/2018 PCP: Patient, No Pcp Per   Brief History:  48 year old female with a history of anxiety/depression, hypertension, tobacco abuse presenting with 75-month history of right upper quadrant abdominal pain that has worsened over the past 1 to 2 weeks.  She states that over the past week her abdominal pain has been essentially constant.  Her pain usually is worsened with eating " heavy meals".  She denies any fevers, chills, chest pain, shortness of breath, dysuria, hematuria.  Over the past 1 to 2 days, she has developed nausea and vomiting without blood.  In addition, the patient has been complaining of loose stools after she eating, particularly " heavy food".  She denies any hematochezia, melena.  Over the past 2 to 3 weeks, the patient has been taken daily NSAIDs to help the pain, but it has not relieved it.  As result, the patient presented for further evaluation.  The patient continues to smoke cigarettes and intermittently smokes cannabis.  In the emergency department, the patient was afebrile hemodynamically stable saturating 96% room air.  BMP, LFTs, and CBC were essentially unremarkable.  Lipase was 26.  Urinalysis was negative for pyuria.  CT of the abdomen and pelvis showed hepatomegaly.  There was no cholelithiasis or gallbladder wall thickening.  Spleen was normal.  There was a nonobstructive left renal stone.  There is no bowel obstruction or inflammation.  Assessment/Plan: Intractable abdominal pain -Concerned about chronic cholecystitis/acalculous cholecystitis -Right upper quadrant US--neg GB, hepatic steatosis -HIDA--normal -soft diet for now -Continue IV Protonix -IV fluids -General surgery consult-->planning lap chole 6/9 -09/04/2018 CT abdomen and pelvis as discussed above -Urinalysis negative for pyuria -Urine drug screen + opioids -judicious opioids  Hepatomegaly -Suspect underlying NASH  -hep b surface antigen--neg -hep C antibody--neg -liver bx 6/9  Tobacco abuse I have discussed tobacco cessation with the patient.  I have counseled the patient regarding the negative impacts of continued tobacco use including but not limited to lung cancer, COPD, and cardiovascular disease.  I have discussed alternatives to tobacco and modalities that may help facilitate tobacco cessation including but not limited to biofeedback, hypnosis, and medications.  Total time spent with tobacco counseling was 4 minutes.         Disposition Plan:   Home when cleared by general surgery Family Communication:  No Family at bedside  Consultants:  none  Code Status:  FULL  DVT Prophylaxis:  [place] Heparin / [place] Lovenox   Procedures: As Listed in Progress Note Above  Antibiotics: None   Subjective: Pt states abd pain is better, only with opioids.  Denies f,c, cp, sob, vomiting, diarrhea.  Complains of nausea.    Objective: Vitals:   09/05/18 1942 09/05/18 2059 09/06/18 0527 09/06/18 1426  BP:  107/67 (!) 93/57 98/61  Pulse: 74 74 71 72  Resp: 16 18 18 17   Temp:  98.5 F (36.9 C) 98.1 F (36.7 C) 98 F (36.7 C)  TempSrc:  Oral Oral   SpO2: 92% 96% 94% 97%  Weight:      Height:        Intake/Output Summary (Last 24 hours) at 09/06/2018 1700 Last data filed at 09/06/2018 1500 Gross per 24 hour  Intake 2682.75 ml  Output -  Net 2682.75 ml   Weight change:  Exam:   General:  Pt is alert, follows commands appropriately, not in acute distress  HEENT: No icterus,  No thrush, No neck mass, [place]/AT  Cardiovascular: RRR, S1/S2, no rubs, no gallops  Respiratory: CTA bilaterally, no wheezing, no crackles, no rhonchi  Abdomen: Soft/+BS, RUQ and epigastric tender, non distended, no guarding  Extremities: Nonpitting edema, No lymphangitis, No petechiae, No rashes, no synovitis   Data Reviewed: I have personally reviewed following labs and imaging studies Basic  Metabolic Panel: Recent Labs  Lab 09/04/18 1431 09/05/18 0740 09/06/18 0624  NA 141 143 142  K 3.9 4.1 3.6  CL 102 106 108  CO2 27 29 27   GLUCOSE 96 97 92  BUN 14 11 9   CREATININE 0.82 0.73 0.80  CALCIUM 9.3 8.8* 8.7*   Liver Function Tests: Recent Labs  Lab 09/04/18 1431 09/06/18 0624  AST 15 11*  ALT 18 11  ALKPHOS 82 59  BILITOT 0.5 0.6  PROT 7.8 5.9*  ALBUMIN 4.6 3.5   Recent Labs  Lab 09/04/18 1431  LIPASE 26   No results for input(s): AMMONIA in the last 168 hours. Coagulation Profile: No results for input(s): INR, PROTIME in the last 168 hours. CBC: Recent Labs  Lab 09/04/18 1431 09/05/18 0740 09/06/18 0624  WBC 9.6 7.4 7.3  NEUTROABS 6.4  --   --   HGB 14.0 12.2 11.6*  HCT 42.9 39.3 37.6  MCV 84.3 85.6 86.0  PLT 298 249 233   Cardiac Enzymes: No results for input(s): CKTOTAL, CKMB, CKMBINDEX, TROPONINI in the last 168 hours. BNP: Invalid input(s): POCBNP CBG: Recent Labs  Lab 09/05/18 2343  GLUCAP 87   HbA1C: No results for input(s): HGBA1C in the last 72 hours. Urine analysis:    Component Value Date/Time   COLORURINE YELLOW 09/04/2018 1431   APPEARANCEUR HAZY (A) 09/04/2018 1431   LABSPEC 1.025 09/04/2018 1431   PHURINE 5.0 09/04/2018 1431   GLUCOSEU NEGATIVE 09/04/2018 1431   HGBUR NEGATIVE 09/04/2018 1431   BILIRUBINUR NEGATIVE 09/04/2018 1431   KETONESUR NEGATIVE 09/04/2018 1431   PROTEINUR NEGATIVE 09/04/2018 1431   UROBILINOGEN 0.2 04/08/2008 1128   NITRITE NEGATIVE 09/04/2018 1431   LEUKOCYTESUR NEGATIVE 09/04/2018 1431   Sepsis Labs: @LABRCNTIP (procalcitonin:4,lacticidven:4) ) Recent Results (from the past 240 hour(s))  SARS Coronavirus 2 (CEPHEID - Performed in [facility] hospital lab), Hosp Order     Status: None   Collection Time: 09/04/18  5:20 PM  Result Value Ref Range Status   SARS Coronavirus 2 NEGATIVE NEGATIVE Final    Comment: (NOTE) If result is NEGATIVE SARS-CoV-2 target nucleic acids are NOT  DETECTED. The SARS-CoV-2 RNA is generally detectable in upper and lower  respiratory specimens during the acute phase of infection. The lowest  concentration of SARS-CoV-2 viral copies this assay can detect is 250  copies / mL. A negative result does not preclude SARS-CoV-2 infection  and should not be used as the sole basis for treatment or other  patient management decisions.  A negative result may occur with  improper specimen collection / handling, submission of specimen other  than nasopharyngeal swab, presence of viral mutation(s) within the  areas targeted by this assay, and inadequate number of viral copies  (<250 copies / mL). A negative result must be combined with clinical  observations, patient history, and epidemiological information. If result is POSITIVE SARS-CoV-2 target nucleic acids are DETECTED. The SARS-CoV-2 RNA is generally detectable in upper and lower  respiratory specimens dur ing the acute phase of infection.  Positive  results are indicative of active infection with SARS-CoV-2.  Clinical  correlation with patient history and  other diagnostic information is  necessary to determine patient infection status.  Positive results do  not rule out bacterial infection or co-infection with other viruses. If result is PRESUMPTIVE POSTIVE SARS-CoV-2 nucleic acids MAY BE PRESENT.   A presumptive positive result was obtained on the submitted specimen  and confirmed on repeat testing.  While 2019 novel coronavirus  (SARS-CoV-2) nucleic acids may be present in the submitted sample  additional confirmatory testing may be necessary for epidemiological  and / or clinical management purposes  to differentiate between  SARS-CoV-2 and other Sarbecovirus currently known to infect humans.  If clinically indicated additional testing with an alternate test  methodology [phone]([id number]) is advised. The SARS-CoV-2 RNA is generally  detectable in upper and lower respiratory sp ecimens during  the acute  phase of infection. The expected result is Negative. Fact Sheet for Patients:  BoilerBrush.com.cyhttps://www.fda.gov/media/136312/download Fact Sheet for Healthcare Providers: https://pope.com/https://www.fda.gov/media/136313/download This test is not yet approved or cleared by the [place] FDA and has been authorized for detection and/or diagnosis of SARS-CoV-2 by FDA under an Emergency Use Authorization (EUA).  This EUA will remain in effect (meaning this test can be used) for the duration of the COVID-19 declaration under Section 564(b)(1) of the Act, 21 U.S.C. section 360bbb-3(b)(1), unless the authorization is terminated or revoked sooner. Performed at [facility], [address] [place], [place] [address]   Surgical pcr screen     Status: Abnormal   Collection Time: 09/05/18  5:02 AM  Result Value Ref Range Status   MRSA, PCR NEGATIVE NEGATIVE Final   Staphylococcus aureus POSITIVE (A) NEGATIVE Final    Comment: RESULT CALLED TO, READ BACK BY AND VERIFIED WITH: HOWERTON M. @ [id number] ON [id number] BY HENDERSON L. (NOTE) The Xpert SA Assay (FDA approved for NASAL specimens in patients 49 years of age and older), is one component of a comprehensive surveillance program. It is not intended to diagnose infection nor to guide or monitor treatment. Performed at [facility], [address] [place], [place] [address]      Scheduled Meds: . Chlorhexidine Gluconate Cloth  6 each Topical Q0600  . Chlorhexidine Gluconate Cloth  6 each Topical Once   And  . Chlorhexidine Gluconate Cloth  6 each Topical Once  . enoxaparin (LOVENOX) injection  55 mg Subcutaneous Q24H  . mupirocin ointment  1 application Nasal BID  . nicotine  14 mg Transdermal Daily  . pantoprazole (PROTONIX) IV  40 mg Intravenous Q12H  . sucralfate  1 g Oral TID WC & HS   Continuous Infusions: . sodium chloride 100 mL/hr at 09/06/18 0525  . [START ON 09/07/2018] cefoTEtan (CEFOTAN) IV      Procedures/Studies: Ct Abdomen Pelvis W  Contrast  Result Date: 09/04/2018 CLINICAL DATA:  Right flank pain EXAM: CT ABDOMEN AND PELVIS WITH CONTRAST TECHNIQUE: Multidetector CT imaging of the abdomen and pelvis was performed using the standard protocol following bolus administration of intravenous contrast. CONTRAST:  100mL OMNIPAQUE IOHEXOL 300 MG/ML  SOLN COMPARISON:  03/15/2008 FINDINGS: Lower chest: No acute abnormality. Hepatobiliary: Hepatomegaly, maximum coronal span 24 cm. No solid liver abnormality is seen. No gallstones, gallbladder wall thickening, or biliary dilatation. Pancreas: Unremarkable. No pancreatic ductal dilatation or surrounding inflammatory changes. Spleen: Normal in size without significant abnormality. Adrenals/Urinary Tract: Adrenal glands are unremarkable. Punctuate nonobstructive calculus of the midportion of the left kidney. Thickening of the urinary bladder. Stomach/Bowel: Stomach is within normal limits. Appendix appears normal. No evidence of bowel wall thickening, distention, or inflammatory changes.  Vascular/Lymphatic: Scattered calcific atherosclerosis. No enlarged abdominal or pelvic lymph nodes. Reproductive: No mass or other significant abnormality. Other: No abdominal wall hernia or abnormality. No abdominopelvic ascites. Musculoskeletal: No acute or significant osseous findings. IMPRESSION: 1.  Hepatomegaly. 2. Thickening of the urinary bladder, suggestive of infectious or inflammatory cystitis. Correlate with urinalysis. 3. Punctuate nonobstructive calculus of the midportion of the left kidney. No right-sided or ureteral calculus. No hydronephrosis. Electronically Signed   By: Lauralyn PrimesAlex  Bibbey M.D.   On: 09/04/2018 16:03   Nm Hepato W/eject Fract  Result Date: 09/06/2018 CLINICAL DATA:  Nausea and vomiting.  Right upper quadrant pain. EXAM: NUCLEAR MEDICINE HEPATOBILIARY IMAGING WITH GALLBLADDER EF TECHNIQUE: Sequential images of the abdomen were obtained out to 60 minutes following intravenous administration of  radiopharmaceutical. After oral ingestion of Ensure, gallbladder ejection fraction was determined. At 60 min, normal ejection fraction is greater than 33%. RADIOPHARMACEUTICALS:  4.83 mCi Tc-7159m  Choletec IV COMPARISON:  None. FINDINGS: Prompt uptake and biliary excretion of activity by the liver is seen. Gallbladder activity is visualized, consistent with patency of cystic duct. Biliary activity passes into small bowel, consistent with patent common bile duct. Calculated gallbladder ejection fraction is 59%. (Normal gallbladder ejection fraction with Ensure is greater than 33%.) IMPRESSION: Normal study.  Normal gallbladder ejection fraction. Electronically Signed   By: Gerome Samavid  Williams III M.D   On: 09/06/2018 12:55   [place] Abdomen Limited Ruq  Result Date: 09/05/2018 CLINICAL DATA:  Right upper quadrant abdominal pain. EXAM: ULTRASOUND ABDOMEN LIMITED RIGHT UPPER QUADRANT COMPARISON:  CT of 1 day prior FINDINGS: Gallbladder: No gallstones or wall thickening visualized. No sonographic Murphy sign noted by sonographer. Common bile duct: Diameter: Normal, 3 mm. Liver: Suspect mild increased echogenicity as can be seen with steatosis. Portal vein is patent on color Doppler imaging with normal direction of blood flow towards the liver. IMPRESSION: 1.  No acute process or explanation for right upper quadrant pain. 2. Probable hepatic steatosis. Electronically Signed   By: Jeronimo GreavesKyle  Talbot M.D.   On: 09/05/2018 11:37    Catarina Hartshornavid Shinita Mac, DO  [facility] Pager [phone]  If 7PM-7AM, please contact night-coverage [email or web address] Password [email or web address] 09/06/2018, 5:00 PM   LOS: 0 days

## 2018-09-07 ENCOUNTER — Other Ambulatory Visit: Payer: Self-pay

## 2018-09-07 ENCOUNTER — Inpatient Hospital Stay (HOSPITAL_COMMUNITY): Payer: Medicaid Other | Admitting: Anesthesiology

## 2018-09-07 ENCOUNTER — Telehealth: Payer: Self-pay | Admitting: Gastroenterology

## 2018-09-07 ENCOUNTER — Encounter (HOSPITAL_COMMUNITY)
Admission: EM | Disposition: A | Discharge status: Home / Self Care | Payer: Self-pay | Source: Home / Self Care | Attending: Internal Medicine

## 2018-09-07 DIAGNOSIS — K811 Chronic cholecystitis: Principal | ICD-10-CM

## 2018-09-07 DIAGNOSIS — K760 Fatty (change of) liver, not elsewhere classified: Secondary | ICD-10-CM

## 2018-09-07 HISTORY — PX: LIVER BIOPSY: SHX301

## 2018-09-07 HISTORY — PX: CHOLECYSTECTOMY: SHX55

## 2018-09-07 SURGERY — LAPAROSCOPIC CHOLECYSTECTOMY
Anesthesia: General

## 2018-09-07 MED ORDER — ROCURONIUM BROMIDE 10 MG/ML (PF) SYRINGE
PREFILLED_SYRINGE | INTRAVENOUS | Status: AC
  Filled 2018-09-07: qty 10

## 2018-09-07 MED ORDER — PANTOPRAZOLE SODIUM 40 MG PO TBEC
40.0000 mg | DELAYED_RELEASE_TABLET | Freq: Two times a day (BID) | ORAL | Status: DC
  Administered 2018-09-07 – 2018-09-08 (×3): 40 mg via ORAL
  Filled 2018-09-07 (×3): qty 1

## 2018-09-07 MED ORDER — SODIUM CHLORIDE 0.9 % IR SOLN
Status: DC | PRN
  Administered 2018-09-07: 1000 mL

## 2018-09-07 MED ORDER — HEMOSTATIC AGENTS (NO CHARGE) OPTIME
TOPICAL | Status: DC | PRN
  Administered 2018-09-07 (×2): 1 via TOPICAL

## 2018-09-07 MED ORDER — OXYCODONE HCL 5 MG PO TABS
5.0000 mg | ORAL_TABLET | ORAL | Status: DC | PRN
  Administered 2018-09-07 – 2018-09-08 (×5): 5 mg via ORAL
  Filled 2018-09-07 (×5): qty 1

## 2018-09-07 MED ORDER — ROCURONIUM BROMIDE 100 MG/10ML IV SOLN
INTRAVENOUS | Status: DC | PRN
  Administered 2018-09-07: 40 mg via INTRAVENOUS

## 2018-09-07 MED ORDER — SUGAMMADEX SODIUM 200 MG/2ML IV SOLN
INTRAVENOUS | Status: DC | PRN
  Administered 2018-09-07: 200 mg via INTRAVENOUS

## 2018-09-07 MED ORDER — DOCUSATE SODIUM 100 MG PO CAPS
100.0000 mg | ORAL_CAPSULE | Freq: Two times a day (BID) | ORAL | Status: DC
  Administered 2018-09-07 – 2018-09-08 (×3): 100 mg via ORAL
  Filled 2018-09-07 (×3): qty 1

## 2018-09-07 MED ORDER — FENTANYL CITRATE (PF) 100 MCG/2ML IJ SOLN
INTRAMUSCULAR | Status: DC | PRN
  Administered 2018-09-07 (×4): 50 ug via INTRAVENOUS

## 2018-09-07 MED ORDER — MIDAZOLAM HCL 2 MG/2ML IJ SOLN: 0.5000 mg | Freq: Once | INTRAMUSCULAR | Status: DC | PRN

## 2018-09-07 MED ORDER — BUPIVACAINE HCL (PF) 0.5 % IJ SOLN
INTRAMUSCULAR | Status: AC
  Filled 2018-09-07: qty 30

## 2018-09-07 MED ORDER — SUCCINYLCHOLINE CHLORIDE 20 MG/ML IJ SOLN
INTRAMUSCULAR | Status: DC | PRN
  Administered 2018-09-07: 140 mg via INTRAVENOUS

## 2018-09-07 MED ORDER — FENTANYL CITRATE (PF) 100 MCG/2ML IJ SOLN
INTRAMUSCULAR | Status: AC
  Filled 2018-09-07: qty 4

## 2018-09-07 MED ORDER — LACTATED RINGERS IV SOLN
INTRAVENOUS | Status: DC
  Administered 2018-09-07: 11:00:00 via INTRAVENOUS

## 2018-09-07 MED ORDER — SUGAMMADEX SODIUM 500 MG/5ML IV SOLN
INTRAVENOUS | Status: AC
  Filled 2018-09-07: qty 15

## 2018-09-07 MED ORDER — PROMETHAZINE HCL 25 MG/ML IJ SOLN: 6.2500 mg | INTRAMUSCULAR | Status: DC | PRN

## 2018-09-07 MED ORDER — HYDROMORPHONE HCL 1 MG/ML IJ SOLN
0.2500 mg | INTRAMUSCULAR | Status: DC | PRN
  Administered 2018-09-07 (×4): 0.5 mg via INTRAVENOUS
  Filled 2018-09-07 (×4): qty 0.5

## 2018-09-07 MED ORDER — MIDAZOLAM HCL 2 MG/2ML IJ SOLN
INTRAMUSCULAR | Status: AC
  Filled 2018-09-07: qty 2

## 2018-09-07 MED ORDER — MIDAZOLAM HCL 5 MG/5ML IJ SOLN
INTRAMUSCULAR | Status: DC | PRN
  Administered 2018-09-07: 2 mg via INTRAVENOUS

## 2018-09-07 MED ORDER — DEXAMETHASONE SODIUM PHOSPHATE 10 MG/ML IJ SOLN
INTRAMUSCULAR | Status: DC | PRN
  Administered 2018-09-07: 5 mg via INTRAVENOUS

## 2018-09-07 MED ORDER — HYDROCODONE-ACETAMINOPHEN 7.5-325 MG PO TABS: 1.0000 | ORAL_TABLET | Freq: Once | ORAL | Status: DC | PRN

## 2018-09-07 MED ORDER — SODIUM CHLORIDE 0.9% FLUSH
INTRAVENOUS | Status: AC
  Filled 2018-09-07: qty 10

## 2018-09-07 MED ORDER — SUCCINYLCHOLINE CHLORIDE 200 MG/10ML IV SOSY
PREFILLED_SYRINGE | INTRAVENOUS | Status: AC
  Filled 2018-09-07: qty 10

## 2018-09-07 MED ORDER — PROPOFOL 10 MG/ML IV BOLUS
INTRAVENOUS | Status: AC
  Filled 2018-09-07: qty 20

## 2018-09-07 MED ORDER — BUPIVACAINE HCL (PF) 0.5 % IJ SOLN
INTRAMUSCULAR | Status: DC | PRN
  Administered 2018-09-07: 10 mL

## 2018-09-07 MED ORDER — PROPOFOL 10 MG/ML IV BOLUS
INTRAVENOUS | Status: DC | PRN
  Administered 2018-09-07: 200 mg via INTRAVENOUS

## 2018-09-07 SURGICAL SUPPLY — 53 items
ADH SKN CLS APL DERMABOND .7 (GAUZE/BANDAGES/DRESSINGS) ×1
APL PRP STRL LF DISP 70% ISPRP (MISCELLANEOUS) ×1
APL SRG 38 LTWT LNG FL B (MISCELLANEOUS) ×1
APPLICATOR ARISTA FLEXITIP XL (MISCELLANEOUS) ×2 IMPLANT
APPLIER CLIP ROT 10 11.4 M/L (STAPLE) ×3
APR CLP MED LRG 11.4X10 (STAPLE) ×1
BAG RETRIEVAL 10 (BASKET) ×1
BAG RETRIEVAL 10MM (BASKET) ×1
BLADE SURG 15 STRL LF DISP TIS (BLADE) ×1 IMPLANT
BLADE SURG 15 STRL SS (BLADE) ×3
CHLORAPREP W/TINT 26 (MISCELLANEOUS) ×3 IMPLANT
CLIP APPLIE ROT 10 11.4 M/L (STAPLE) ×1 IMPLANT
CLOTH BEACON ORANGE TIMEOUT ST (SAFETY) ×3 IMPLANT
COVER LIGHT HANDLE STERIS (MISCELLANEOUS) ×6 IMPLANT
COVER WAND RF STERILE (DRAPES) ×2 IMPLANT
DECANTER SPIKE VIAL GLASS SM (MISCELLANEOUS) ×3 IMPLANT
DERMABOND ADVANCED (GAUZE/BANDAGES/DRESSINGS) ×2
DERMABOND ADVANCED .7 DNX12 (GAUZE/BANDAGES/DRESSINGS) ×1 IMPLANT
ELECT REM PT RETURN 9FT ADLT (ELECTROSURGICAL) ×3
ELECTRODE REM PT RTRN 9FT ADLT (ELECTROSURGICAL) ×1 IMPLANT
FILTER SMOKE EVAC LAPAROSHD (FILTER) ×3 IMPLANT
GLOVE BIO SURGEON STRL SZ 6.5 (GLOVE) ×2 IMPLANT
GLOVE BIO SURGEONS STRL SZ 6.5 (GLOVE) ×1
GLOVE BIOGEL M 7.0 STRL (GLOVE) ×2 IMPLANT
GLOVE BIOGEL PI IND STRL 6.5 (GLOVE) ×1 IMPLANT
GLOVE BIOGEL PI IND STRL 7.0 (GLOVE) ×3 IMPLANT
GLOVE BIOGEL PI INDICATOR 6.5 (GLOVE) ×2
GLOVE BIOGEL PI INDICATOR 7.0 (GLOVE) ×6
GLOVE ECLIPSE 6.5 STRL STRAW (GLOVE) ×4 IMPLANT
GOWN STRL REUS W/TWL LRG LVL3 (GOWN DISPOSABLE) ×9 IMPLANT
HEMOSTAT ARISTA ABSORB 3G PWDR (HEMOSTASIS) ×2 IMPLANT
HEMOSTAT SNOW SURGICEL 2X4 (HEMOSTASIS) ×3 IMPLANT
INST SET LAPROSCOPIC AP (KITS) ×3 IMPLANT
KIT TURNOVER KIT A (KITS) ×3 IMPLANT
MANIFOLD NEPTUNE II (INSTRUMENTS) ×3 IMPLANT
NDL INSUFFLATION 14GA 120MM (NEEDLE) ×1 IMPLANT
NEEDLE INSUFFLATION 14GA 120MM (NEEDLE) ×3 IMPLANT
NS IRRIG 1000ML POUR BTL (IV SOLUTION) ×3 IMPLANT
PACK LAP CHOLE LZT030E (CUSTOM PROCEDURE TRAY) ×3 IMPLANT
PAD ARMBOARD 7.5X6 YLW CONV (MISCELLANEOUS) ×3 IMPLANT
SET BASIN LINEN APH (SET/KITS/TRAYS/PACK) ×3 IMPLANT
SLEEVE ENDOPATH XCEL 5M (ENDOMECHANICALS) ×3 IMPLANT
SUT MNCRL AB 4-0 PS2 18 (SUTURE) ×3 IMPLANT
SUT VICRYL 0 UR6 27IN ABS (SUTURE) ×3 IMPLANT
SYS BAG RETRIEVAL 10MM (BASKET) ×1
SYSTEM BAG RETRIEVAL 10MM (BASKET) ×1 IMPLANT
TROCAR ENDO BLADELESS 11MM (ENDOMECHANICALS) ×3 IMPLANT
TROCAR XCEL NON-BLD 5MMX100MML (ENDOMECHANICALS) ×3 IMPLANT
TROCAR XCEL UNIV SLVE 11M 100M (ENDOMECHANICALS) ×3 IMPLANT
TUBE CONNECTING 12'X1/4 (SUCTIONS) ×1
TUBE CONNECTING 12X1/4 (SUCTIONS) ×2 IMPLANT
TUBING INSUFFLATION (TUBING) ×3 IMPLANT
WARMER LAPAROSCOPE (MISCELLANEOUS) ×3 IMPLANT

## 2018-09-07 NOTE — Anesthesia Preprocedure Evaluation (Signed)
Anesthesia Evaluation  Patient identified by MRN, date of birth, ID band Patient awake    Reviewed: Allergy & Precautions, NPO status , Patient's Chart, lab work & pertinent test results  Airway Mallampati: III  TM Distance: >3 FB Neck ROM: Full    Dental no notable dental hx. (+) Poor Dentition, Missing   Pulmonary COPD, Current Smoker,  States smokes 1 PPD States was told she has COPD but denieas any o2 or inhaler use  Never formally tested for OSA Snores    Pulmonary exam normal breath sounds clear to auscultation       Cardiovascular Exercise Tolerance: Good hypertension, Pt. on medications negative cardio ROS Normal cardiovascular examI Rhythm:Regular Rate:Normal     Neuro/Psych Anxiety negative neurological ROS  negative psych ROS   GI/Hepatic negative GI ROS, Neg liver ROS,   Endo/Other  Morbid obesity  Renal/GU negative Renal ROS  negative genitourinary   Musculoskeletal  (+) Arthritis , Osteoarthritis,    Abdominal   Peds negative pediatric ROS (+)  Hematology negative hematology ROS (+)   Anesthesia Other Findings   Reproductive/Obstetrics negative OB ROS                             Anesthesia Physical Anesthesia Plan  ASA: III  Anesthesia Plan: General   Post-op Pain Management:    Induction: Intravenous  PONV Risk Score and Plan:   Airway Management Planned: Oral ETT  Additional Equipment:   Intra-op Plan:   Post-operative Plan: Extubation in OR  Informed Consent: I have reviewed the patients History and Physical, chart, labs and discussed the procedure including the risks, benefits and alternatives for the proposed anesthesia with the patient or authorized representative who has indicated his/her understanding and acceptance.     Dental advisory given  Plan Discussed with: CRNA  Anesthesia Plan Comments: (Plan Full PPE use  Plan GETA-d/w pt -WTP  same )        Anesthesia Quick Evaluation

## 2018-09-07 NOTE — Progress Notes (Signed)
[facility]  Called daughter, Autumn, and notified surgery complete and everything went well. Pending how the patient feels could possibly go home later today versus tomorrow.  Curlene Labrum, MD [facility] [address] [place], [place] [address] [phone] (office)

## 2018-09-07 NOTE — Progress Notes (Signed)
Pt had pain from 5 to 7 after transporting upstairs. Pt requested med for anxiety as well. nad

## 2018-09-07 NOTE — Telephone Encounter (Signed)
Please arrange hospital follow-up in next 4-6 weeks for history of fatty liver, abdominal pain. Underwent cholecystectomy while inpatient and liver biopsy. We are seeing her to determine if EGD is needed and follow for fatty liver.

## 2018-09-07 NOTE — Op Note (Signed)
Operative Note   Preoperative Diagnosis: Chronic cholecystitis, hepatomegaly/ fatty liver     Postoperative Diagnosis: Same   Procedure(s) Performed: Laparoscopic cholecystectomy, Wedge liver biopsy   Surgeon: Lanell Matar. Constance Haw, MD   Assistants: No qualified resident was available   Anesthesia: General endotracheal   Anesthesiologist: Lenice Llamas, MD    Specimens: Gallbladder    Estimated Blood Loss: Minimal    Blood Replacement: None    Complications: None    Operative Findings: large distended gallbladder, decompression performed draining bile    Procedure: The patient was taken to the operating room and placed supine. General endotracheal anesthesia was induced. Intravenous antibiotics were administered per protocol. An orogastric tube positioned to decompress the stomach. The abdomen was prepared and draped in the usual sterile fashion.    A supraumbilical incision was made and a Veress technique was utilized to achieve pneumoperitoneum to 15 mmHg with carbon dioxide. A 11 mm optiview port was placed through the supraumbilical region, and a 10 mm 0-degree operative laparoscope was introduced. The area underlying the trocar and Veress needle were inspected and without evidence of injury.  Remaining trocars were placed under direct vision. Two 5 mm ports were placed in the right abdomen, between the anterior axillary and midclavicular line.  A final 11 mm port was placed through the mid-epigastrium, near the falciform ligament.   The gallbladder was very distended and had to be decompressed and bile drained in order to elevate it cephalad. Once this was done, the gallbladder fundus was elevated cephalad and the infundibulum was retracted to the patient's right. The gallbladder/cystic duct junction was skeletonized. The cystic artery noted in the triangle of Calot and was also skeletonized.  We then continued liberal medial and lateral dissection until the critical view of safety  was achieved.    The cystic duct was triply clipped and divided. The cystic artery were doubly clipped and divided.  There was a second smaller posterior branch of the cystic artery that was doubly clipped and divided.   The gallbladder was dissected from the liver bed with electrocautery. Attention was then turned to getting a wedge liver biopsy, this was done with electrocautery and a large wedge was removed. The liver wedge and the gallbladder were placed in an Endopouch. Final inspection demonstrated adequate hemostasis. Arista and Surgical Emogene Morgan was placed in the area of the liver bed and on the biopsy site. Due to COVID 19, pneumoperitoneum was evacuated through the PlumAway. Trocars were removed. The endopouch was retrieved through the epigastric site. The umbilical and epigastric sites were smaller than my finger and were not closed. The skin incisions were closed with 4-0 Monocryl subcuticular sutures and Dermabond. The patient was awakened from anesthesia and extubated without complication.    Curlene Labrum, MD [facility] [address] [place], [place] [address] [phone] (office)

## 2018-09-07 NOTE — Anesthesia Postprocedure Evaluation (Signed)
Anesthesia Post Note  Patient: Leslie Lowery  Procedure(s) Performed: LAPAROSCOPIC CHOLECYSTECTOMY (N/A ) LAPAROSCOPIC  LIVER BIOPSY (N/A )  Patient location during evaluation: PACU Anesthesia Type: General Level of consciousness: awake and alert and patient cooperative Pain management: satisfactory to patient Vital Signs Assessment: post-procedure vital signs reviewed and stable Respiratory status: spontaneous breathing Cardiovascular status: stable Postop Assessment: no apparent nausea or vomiting Anesthetic complications: no     Last Vitals:  Vitals:   09/07/18 1331 09/07/18 1343  BP: 119/74 118/78  Pulse: 72 70  Resp: 13 16  Temp:  36.8 C  SpO2: 93% 93%    Last Pain:  Vitals:   09/07/18 1343  TempSrc: Oral  PainSc:                  Drucie Opitz

## 2018-09-07 NOTE — Transfer of Care (Signed)
Immediate Anesthesia Transfer of Care Note  Patient: Leslie Lowery  Procedure(s) Performed: LAPAROSCOPIC CHOLECYSTECTOMY (N/A ) LAPAROSCOPIC  LIVER BIOPSY (N/A )  Patient Location: PACU  Anesthesia Type:General  Level of Consciousness: awake, alert  and patient cooperative  Airway & Oxygen Therapy: Patient Spontanous Breathing and non-rebreather face mask  Post-op Assessment: Report given to RN and Post -op Vital signs reviewed and stable  Post vital signs: Reviewed and stable  Last Vitals:  Vitals Value Taken Time  BP    Temp    Pulse 92 09/07/2018 12:58 PM  Resp    SpO2 94 % 09/07/2018 12:58 PM  Vitals shown include unvalidated device data.  Last Pain:  Vitals:   09/07/18 1103  TempSrc: Oral  PainSc: 3       Patients Stated Pain Goal: 2 (33/83/29 1916)  Complications: No apparent anesthesia complications

## 2018-09-07 NOTE — Progress Notes (Signed)
[facility]  No changes in history and physical. No questions this AM. GI has seen and will see as outpatient for fatty liver/ possible need for EGD.   Curlene Labrum, MD [facility] [address] [place], [place] [address] [phone] (office)

## 2018-09-07 NOTE — Anesthesia Procedure Notes (Signed)
Procedure Name: Intubation Date/Time: 09/07/2018 11:53 AM Performed by: Charmaine Downs, CRNA Pre-anesthesia Checklist: Patient identified, Emergency Drugs available, Suction available and Patient being monitored Patient Re-evaluated:Patient Re-evaluated prior to induction Oxygen Delivery Method: Circle system utilized Preoxygenation: Pre-oxygenation with 100% oxygen Induction Type: IV induction, Rapid sequence and Cricoid Pressure applied Laryngoscope Size: Glidescope Grade View: Grade II Tube size: 7.0 mm Number of attempts: 1 Airway Equipment and Method: Video-laryngoscopy and Rigid stylet Placement Confirmation: ETT inserted through vocal cords under direct vision,  positive ETCO2,  breath sounds checked- equal and bilateral and CO2 detector Secured at: 22 cm Tube secured with: Tape Dental Injury: Teeth and Oropharynx as per pre-operative assessment

## 2018-09-07 NOTE — Progress Notes (Signed)
PROGRESS NOTE  Leslie Lowery QMG:[id number] DOB: March 15, 1971 DOA: 09/04/2018 PCP: Patient, No Pcp Per   Brief History: 48 year old female with a history of anxiety/depression, hypertension, tobacco abuse presenting with 69-month history of right upper quadrant abdominal pain that has worsened over the past 1 to 2 weeks. She states that over the past week her abdominal pain has been essentially constant. Her pain usually is worsened with eating "heavy meals".She denies any fevers, chills, chest pain, shortness of breath, dysuria, hematuria. Over the past 1 to 2 days, she has developed nausea and vomiting without blood. In addition, the patient has been complaining of loose stools after she eating, particularly "heavy food".She denies any hematochezia, melena. Over the past 2 to 3 weeks, the patient has been taken daily NSAIDs to help the pain, but it has not relieved it. As result, the patient presented for further evaluation. The patient continues to smoke cigarettes and intermittently smokes cannabis. In the emergency department, the patient was afebrile hemodynamically stable saturating 96% room air. BMP, LFTs, and CBC were essentially unremarkable. Lipase was 26. Urinalysis was negative for pyuria. CT of the abdomen and pelvis showed hepatomegaly. There was no cholelithiasis or gallbladder wall thickening. Spleen was normal. There was a nonobstructive left renal stone. There is no bowel obstruction or inflammation.  Assessment/Plan: Intractable abdominal pain -Concerned about chronic cholecystitis/acalculous cholecystitis -Right upper quadrant US--neg GB, hepatic steatosis -HIDA--normal -soft diet for now -Continue IV Protonix -IV fluids -General surgery consult-->lap chole 6/9 with liver bx -09/04/2018 CT abdomen and pelvis as discussed above -Urinalysis negative for pyuria -Urine drug screen + opioids -judicious opioids -avoid NSAIDS  Hepatomegaly  -Suspect underlyingNASH -hep b surface antigen--neg -hep C antibody--neg -liver bx 6/9 -appreciate GI consult  Tobacco abuse I have discussed tobacco cessation with the patient. I have counseled the patient regarding the negative impacts of continued tobacco use including but not limited to lung cancer, COPD, and cardiovascular disease. I have discussed alternatives to tobacco and modalities that may help facilitate tobacco cessation including but not limited to biofeedback, hypnosis, and medications. Total time spent with tobacco counseling was 4 minutes.         Disposition Plan: Home when cleared by general surgery Family Communication:NoFamily at bedside  Consultants:none  Code Status: FULL  DVT Prophylaxis: [place] Lovenox   Procedures: As Listed in Progress Note Above  Antibiotics: None      Subjective:  Abdominal pain about 25 % better.  Patient denies fevers, chills, headache, chest pain, dyspnea, nausea, vomiting, diarrhea,  dysuria, hematuria, hematochezia, and melena.  Objective: Vitals:   09/06/18 1426 09/06/18 1930 09/06/18 2108 09/07/18 0516  BP: 98/61  127/69 132/67  Pulse: 72  72 66  Resp: 17  16 16   Temp: 98 F (36.7 C)  98.2 F (36.8 C) 98.8 F (37.1 C)  TempSrc:   Oral Oral  SpO2: 97% 95% 98%   Weight:      Height:        Intake/Output Summary (Last 24 hours) at 09/07/2018 1049 Last data filed at 09/06/2018 2300 Gross per 24 hour  Intake 2960.42 ml  Output 300 ml  Net 2660.42 ml   Weight change:  Exam:   General:  Pt is alert, follows commands appropriately, not in acute distress  HEENT: No icterus, No thrush, No neck mass, [place]/AT  Cardiovascular: RRR, S1/S2, no rubs, no gallops  Respiratory: CTA bilaterally, no wheezing, no crackles, no rhonchi  Abdomen:  Soft/+BS, RUQ tender, non distended, no guarding  Extremities: No edema, No lymphangitis, No petechiae, No rashes, no synovitis   Data Reviewed:  I have personally reviewed following labs and imaging studies Basic Metabolic Panel: Recent Labs  Lab 09/04/18 1431 09/05/18 0740 09/06/18 0624  NA 141 143 142  K 3.9 4.1 3.6  CL 102 106 108  CO2 27 29 27   GLUCOSE 96 97 92  BUN 14 11 9   CREATININE 0.82 0.73 0.80  CALCIUM 9.3 8.8* 8.7*   Liver Function Tests: Recent Labs  Lab 09/04/18 1431 09/06/18 0624  AST 15 11*  ALT 18 11  ALKPHOS 82 59  BILITOT 0.5 0.6  PROT 7.8 5.9*  ALBUMIN 4.6 3.5   Recent Labs  Lab 09/04/18 1431  LIPASE 26   No results for input(s): AMMONIA in the last 168 hours. Coagulation Profile: No results for input(s): INR, PROTIME in the last 168 hours. CBC: Recent Labs  Lab 09/04/18 1431 09/05/18 0740 09/06/18 0624  WBC 9.6 7.4 7.3  NEUTROABS 6.4  --   --   HGB 14.0 12.2 11.6*  HCT 42.9 39.3 37.6  MCV 84.3 85.6 86.0  PLT 298 249 233   Cardiac Enzymes: No results for input(s): CKTOTAL, CKMB, CKMBINDEX, TROPONINI in the last 168 hours. BNP: Invalid input(s): POCBNP CBG: Recent Labs  Lab 09/05/18 2343  GLUCAP 87   HbA1C: No results for input(s): HGBA1C in the last 72 hours. Urine analysis:    Component Value Date/Time   COLORURINE YELLOW 09/04/2018 1431   APPEARANCEUR HAZY (A) 09/04/2018 1431   LABSPEC 1.025 09/04/2018 1431   PHURINE 5.0 09/04/2018 1431   GLUCOSEU NEGATIVE 09/04/2018 1431   HGBUR NEGATIVE 09/04/2018 1431   BILIRUBINUR NEGATIVE 09/04/2018 1431   KETONESUR NEGATIVE 09/04/2018 1431   PROTEINUR NEGATIVE 09/04/2018 1431   UROBILINOGEN 0.2 04/08/2008 1128   NITRITE NEGATIVE 09/04/2018 1431   LEUKOCYTESUR NEGATIVE 09/04/2018 1431   Sepsis Labs: @LABRCNTIP (procalcitonin:4,lacticidven:4) ) Recent Results (from the past 240 hour(s))  SARS Coronavirus 2 (CEPHEID - Performed in [facility] hospital lab), Hosp Order     Status: None   Collection Time: 09/04/18  5:20 PM  Result Value Ref Range Status   SARS Coronavirus 2 NEGATIVE NEGATIVE Final    Comment: (NOTE)  If result is NEGATIVE SARS-CoV-2 target nucleic acids are NOT DETECTED. The SARS-CoV-2 RNA is generally detectable in upper and lower  respiratory specimens during the acute phase of infection. The lowest  concentration of SARS-CoV-2 viral copies this assay can detect is 250  copies / mL. A negative result does not preclude SARS-CoV-2 infection  and should not be used as the sole basis for treatment or other  patient management decisions.  A negative result may occur with  improper specimen collection / handling, submission of specimen other  than nasopharyngeal swab, presence of viral mutation(s) within the  areas targeted by this assay, and inadequate number of viral copies  (<250 copies / mL). A negative result must be combined with clinical  observations, patient history, and epidemiological information. If result is POSITIVE SARS-CoV-2 target nucleic acids are DETECTED. The SARS-CoV-2 RNA is generally detectable in upper and lower  respiratory specimens dur ing the acute phase of infection.  Positive  results are indicative of active infection with SARS-CoV-2.  Clinical  correlation with patient history and other diagnostic information is  necessary to determine patient infection status.  Positive results do  not rule out bacterial infection or co-infection with other viruses. If result  is PRESUMPTIVE POSTIVE SARS-CoV-2 nucleic acids MAY BE PRESENT.   A presumptive positive result was obtained on the submitted specimen  and confirmed on repeat testing.  While 2019 novel coronavirus  (SARS-CoV-2) nucleic acids may be present in the submitted sample  additional confirmatory testing may be necessary for epidemiological  and / or clinical management purposes  to differentiate between  SARS-CoV-2 and other Sarbecovirus currently known to infect humans.  If clinically indicated additional testing with an alternate test  methodology [phone]([id number]) is advised. The SARS-CoV-2 RNA is generally   detectable in upper and lower respiratory sp ecimens during the acute  phase of infection. The expected result is Negative. Fact Sheet for Patients:  BoilerBrush.com.cyhttps://www.fda.gov/media/136312/download Fact Sheet for Healthcare Providers: https://pope.com/https://www.fda.gov/media/136313/download This test is not yet approved or cleared by the [place] FDA and has been authorized for detection and/or diagnosis of SARS-CoV-2 by FDA under an Emergency Use Authorization (EUA).  This EUA will remain in effect (meaning this test can be used) for the duration of the COVID-19 declaration under Section 564(b)(1) of the Act, 21 U.S.C. section 360bbb-3(b)(1), unless the authorization is terminated or revoked sooner. Performed at [facility], [address] [place], [place] [address]   Surgical pcr screen     Status: Abnormal   Collection Time: 09/05/18  5:02 AM  Result Value Ref Range Status   MRSA, PCR NEGATIVE NEGATIVE Final   Staphylococcus aureus POSITIVE (A) NEGATIVE Final    Comment: RESULT CALLED TO, READ BACK BY AND VERIFIED WITH: HOWERTON M. @ [id number] ON [id number] BY HENDERSON L. (NOTE) The Xpert SA Assay (FDA approved for NASAL specimens in patients 49 years of age and older), is one component of a comprehensive surveillance program. It is not intended to diagnose infection nor to guide or monitor treatment. Performed at [facility], [address] [place], [place] [address]      Scheduled Meds: . [MAR Hold] Chlorhexidine Gluconate Cloth  6 each Topical Q0600  . Chlorhexidine Gluconate Cloth  6 each Topical Once  . [MAR Hold] enoxaparin (LOVENOX) injection  55 mg Subcutaneous Q24H  . [MAR Hold] mupirocin ointment  1 application Nasal BID  . [MAR Hold] nicotine  14 mg Transdermal Daily  . [MAR Hold] pantoprazole (PROTONIX) IV  40 mg Intravenous Q12H  . [MAR Hold] sucralfate  1 g Oral TID WC & HS   Continuous Infusions: . sodium chloride 100 mL/hr at 09/07/18 0544  . cefoTEtan (CEFOTAN) IV       Procedures/Studies: Ct Abdomen Pelvis W Contrast  Result Date: 09/04/2018 CLINICAL DATA:  Right flank pain EXAM: CT ABDOMEN AND PELVIS WITH CONTRAST TECHNIQUE: Multidetector CT imaging of the abdomen and pelvis was performed using the standard protocol following bolus administration of intravenous contrast. CONTRAST:  100mL OMNIPAQUE IOHEXOL 300 MG/ML  SOLN COMPARISON:  03/15/2008 FINDINGS: Lower chest: No acute abnormality. Hepatobiliary: Hepatomegaly, maximum coronal span 24 cm. No solid liver abnormality is seen. No gallstones, gallbladder wall thickening, or biliary dilatation. Pancreas: Unremarkable. No pancreatic ductal dilatation or surrounding inflammatory changes. Spleen: Normal in size without significant abnormality. Adrenals/Urinary Tract: Adrenal glands are unremarkable. Punctuate nonobstructive calculus of the midportion of the left kidney. Thickening of the urinary bladder. Stomach/Bowel: Stomach is within normal limits. Appendix appears normal. No evidence of bowel wall thickening, distention, or inflammatory changes. Vascular/Lymphatic: Scattered calcific atherosclerosis. No enlarged abdominal or pelvic lymph nodes. Reproductive: No mass or other significant abnormality. Other: No abdominal wall hernia or abnormality. No abdominopelvic ascites. Musculoskeletal: No acute or  significant osseous findings. IMPRESSION: 1.  Hepatomegaly. 2. Thickening of the urinary bladder, suggestive of infectious or inflammatory cystitis. Correlate with urinalysis. 3. Punctuate nonobstructive calculus of the midportion of the left kidney. No right-sided or ureteral calculus. No hydronephrosis. Electronically Signed   By: Lauralyn PrimesAlex  Bibbey M.D.   On: 09/04/2018 16:03   Nm Hepato W/eject Fract  Result Date: 09/06/2018 CLINICAL DATA:  Nausea and vomiting.  Right upper quadrant pain. EXAM: NUCLEAR MEDICINE HEPATOBILIARY IMAGING WITH GALLBLADDER EF TECHNIQUE: Sequential images of the abdomen were obtained out to 60 minutes  following intravenous administration of radiopharmaceutical. After oral ingestion of Ensure, gallbladder ejection fraction was determined. At 60 min, normal ejection fraction is greater than 33%. RADIOPHARMACEUTICALS:  4.83 mCi Tc-4862m  Choletec IV COMPARISON:  None. FINDINGS: Prompt uptake and biliary excretion of activity by the liver is seen. Gallbladder activity is visualized, consistent with patency of cystic duct. Biliary activity passes into small bowel, consistent with patent common bile duct. Calculated gallbladder ejection fraction is 59%. (Normal gallbladder ejection fraction with Ensure is greater than 33%.) IMPRESSION: Normal study.  Normal gallbladder ejection fraction. Electronically Signed   By: Gerome Samavid  Williams III M.D   On: 09/06/2018 12:55   Dg Chest Port 1 View  Result Date: 09/06/2018 CLINICAL DATA:  Hypertension and tobacco use. Six month history of right upper quadrant abdominal pain. Preoperative for cholecystectomy. EXAM: PORTABLE CHEST 1 VIEW COMPARISON:  12/09/2007 and overlapping portions of CT abdomen from 09/04/2018 FINDINGS: The heart size and mediastinal contours are within normal limits. Both lungs are clear. The visualized skeletal structures are unremarkable. IMPRESSION: No active disease. Electronically Signed   By: Gaylyn RongWalter  Liebkemann M.D.   On: 09/06/2018 19:56   [place] Abdomen Limited Ruq  Result Date: 09/05/2018 CLINICAL DATA:  Right upper quadrant abdominal pain. EXAM: ULTRASOUND ABDOMEN LIMITED RIGHT UPPER QUADRANT COMPARISON:  CT of 1 day prior FINDINGS: Gallbladder: No gallstones or wall thickening visualized. No sonographic Murphy sign noted by sonographer. Common bile duct: Diameter: Normal, 3 mm. Liver: Suspect mild increased echogenicity as can be seen with steatosis. Portal vein is patent on color Doppler imaging with normal direction of blood flow towards the liver. IMPRESSION: 1.  No acute process or explanation for right upper quadrant pain. 2. Probable hepatic  steatosis. Electronically Signed   By: Jeronimo GreavesKyle  Talbot M.D.   On: 09/05/2018 11:37    Catarina Hartshornavid Pasha Broad, DO  [facility] Pager [phone]  If 7PM-7AM, please contact night-coverage [email or web address] Password [email or web address] 09/07/2018, 10:49 AM   LOS: 1 day

## 2018-09-08 ENCOUNTER — Encounter (HOSPITAL_COMMUNITY): Payer: Self-pay | Admitting: General Surgery

## 2018-09-08 ENCOUNTER — Encounter: Payer: Self-pay | Admitting: Gastroenterology

## 2018-09-08 MED ORDER — HYDROMORPHONE HCL 1 MG/ML IJ SOLN
1.0000 mg | INTRAMUSCULAR | Status: DC | PRN
  Administered 2018-09-08 (×3): 1 mg via INTRAVENOUS
  Filled 2018-09-08 (×3): qty 1

## 2018-09-08 MED ORDER — SUCRALFATE 1 GM/10ML PO SUSP: 1.0000 g | Freq: Three times a day (TID) | ORAL | 0 refills | Status: DC

## 2018-09-08 MED ORDER — DOCUSATE SODIUM 100 MG PO CAPS: 100.0000 mg | ORAL_CAPSULE | Freq: Two times a day (BID) | ORAL | 0 refills | Status: DC

## 2018-09-08 MED ORDER — ONDANSETRON HCL 4 MG PO TABS: 4.0000 mg | ORAL_TABLET | Freq: Four times a day (QID) | ORAL | 0 refills | Status: DC | PRN

## 2018-09-08 MED ORDER — OXYCODONE HCL 5 MG PO TABS: 5.0000 mg | ORAL_TABLET | ORAL | 0 refills | Status: DC | PRN

## 2018-09-08 MED ORDER — PANTOPRAZOLE SODIUM 40 MG PO TBEC: 40.0000 mg | DELAYED_RELEASE_TABLET | Freq: Two times a day (BID) | ORAL | 1 refills | Status: DC

## 2018-09-08 NOTE — Progress Notes (Signed)
IV removed, D/C instructions reviewed, verbalized understanding. Stable patient transported to private vehicle via wheelchair.

## 2018-09-08 NOTE — Discharge Summary (Signed)
Physician Discharge Summary  Patient ID: ARSEMA TUSING MRN: [id number] DOB/AGE: Aug 26, 1970 48 y.o.  Admit date: 09/04/2018 Discharge date: 09/10/2018  Admission Diagnoses: Chronic cholecystitis   Discharge Diagnoses:  Principal Problem:   RUQ abdominal pain Active Problems:   Hepatomegaly   Elevated BP without diagnosis of hypertension   Nausea vomiting and diarrhea   Chronic cholecystitis   Fatty liver   Discharged Condition: good  Hospital Course: Ms. Rozeboom is a 48 yo who came in with right upper quadrant pain, nausea and bloating. She was worked up and noted to have hepatomegaly but no obvious stones or findings on HIDA. She did not increased pain with the HIDA and we discussed the option of gallbladder removal as she likely had some degree of chronic cholecystitis. She also used NSAIDs so I discussed the possibility of gastritis/ ulcer disease and started her on carafate/ protonix BID.  She ultimately decided to opt for laparoscopic cholecystectomy and liver biopsy given the hepatomegaly.  She did well post op and tolerated a diet. She had adequate pain control and was discharged home on POD 1.   Consults: GI and hepatomegaly due to fatty liver (liver biopsy pending) and possible NSAIDs gastritis if pain does not improve after cholecystectomy  Significant Diagnostic Studies: CT, [place], HIDA scan - distended gallbladder, normal function, no stones noted on [place]  Treatments:  Lap cholecystectomy and liver biopsy 09/07/2018   Discharge Exam: Blood pressure 122/75, pulse 81, temperature 98.6 F (37 C), temperature source Oral, resp. rate 16, height 5\' 3"  (1.6 m), weight 109.6 kg, SpO2 95 %. General appearance: alert, cooperative and no distress Resp: normal work of breathing GI: soft, nondistended, appropriately tender, port sites c/d/i with dermabond, no erythema or drainage  Disposition:    Discharge Instructions    Call MD for:  difficulty breathing, headache or visual  disturbances   Complete by: As directed    Call MD for:  persistant dizziness or light-headedness   Complete by: As directed    Call MD for:  persistant nausea and vomiting   Complete by: As directed    Call MD for:  redness, tenderness, or signs of infection (pain, swelling, redness, odor or green/yellow discharge around incision site)   Complete by: As directed    Call MD for:  severe uncontrolled pain   Complete by: As directed    Call MD for:  temperature >100.4   Complete by: As directed    Diet - low sodium heart healthy   Complete by: As directed    Increase activity slowly   Complete by: As directed      Allergies as of 09/08/2018   No Known Allergies     Medication List    TAKE these medications   acetaminophen 500 MG tablet Commonly known as: TYLENOL Take 500 mg by mouth every 6 (six) hours as needed for mild pain or moderate pain.   docusate sodium 100 MG capsule Commonly known as: COLACE Take 1 capsule (100 mg total) by mouth 2 (two) times daily.   ibuprofen 200 MG tablet Commonly known as: ADVIL Take 200 mg by mouth every 6 (six) hours as needed for mild pain or moderate pain.   ondansetron 4 MG tablet Commonly known as: ZOFRAN Take 1 tablet (4 mg total) by mouth every 6 (six) hours as needed for nausea.   oxyCODONE 5 MG immediate release tablet Commonly known as: Oxy IR/ROXICODONE Take 1 tablet (5 mg total) by mouth every 4 (four) hours  as needed for severe pain or breakthrough pain.   pantoprazole 40 MG tablet Commonly known as: PROTONIX Take 1 tablet (40 mg total) by mouth 2 (two) times daily.   sucralfate 1 GM/10ML suspension Commonly known as: CARAFATE Take 10 mLs (1 g total) by mouth 4 (four) times daily -  with meals and at bedtime.      Follow-up Information    Lucretia RoersBridges, Lindsay C, MD On 09/21/2018.   Specialty: General Surgery Why: Telephone call Post op ONLY around 11:15AM.  Dr. Henreitta LeberBridges will call you to check on you by phone. If you need  to be seen in person please notify the office.  Contact information: [address] [place] [address] [phone]           Signed: Lucretia RoersLindsay C Bridges 09/10/2018, 9:47 AM

## 2018-09-08 NOTE — Telephone Encounter (Signed)
PATIENT SCHEDULED AND LETTER SENT  °

## 2018-09-10 ENCOUNTER — Telehealth: Payer: Self-pay | Admitting: General Surgery

## 2018-09-10 ENCOUNTER — Encounter: Payer: Self-pay | Admitting: General Surgery

## 2018-09-10 MED ORDER — CYCLOBENZAPRINE HCL 5 MG PO TABS: 5.0000 mg | ORAL_TABLET | Freq: Three times a day (TID) | ORAL | 0 refills | Status: DC | PRN

## 2018-09-10 MED ORDER — OXYCODONE HCL 5 MG PO TABS: 5.0000 mg | ORAL_TABLET | ORAL | 0 refills | Status: DC | PRN

## 2018-09-10 NOTE — Progress Notes (Signed)
Patient still with pain in her stomach. Has had BM. Pain is about a 7/10 she says and 5/10 after medicine.  No fever or chills. Not a great appetite. Says her pain is the same from prior to surgery.  Feels like it is deeper than in the muscle.   Pain medicine will be out tomorrow. I told her I would refill her prescription for pain and send in a muscle relaxer. The narcotic cannot be refilled until tomorrow.  If she has wors ening pain, fever, cannot eat or drink she will need to go back to ED to get labs and imaging. Discussed she may need to get EGD for gastritis as we had talked about prior. She has been on carafate and protonix.  Curlene Labrum, MD

## 2018-09-10 NOTE — Telephone Encounter (Signed)
Medication refill sent to [facility]. See progress note from today for details.  Curlene Labrum, MD [facility] [address] [place], [place] [address] [phone] (office)

## 2018-09-14 ENCOUNTER — Telehealth: Payer: Self-pay | Admitting: General Surgery

## 2018-09-14 NOTE — Telephone Encounter (Signed)
[facility]  Patient called and talked to RN at office. Patient wanting more Roxicodone. I refilled this on Saturday for her second prescription since surgery (now has a total of 40 pills prescribed).  Flexeril sent in Friday. She says her pain is improved and is around 5-6. She has been taking ibuprofen. Pain is in the bottom of her stomach and in her bottom of her back.    No fever but feels like she has some chills. Says she is eating little not much of an appetite. She is staying hydrated.  She had BMs regularly every other day.   Pathology did come back as chronic cholecystitis and mild fatty liver.  She is worried about going to the ED because of COVID 19. Told her about the urgent care on [address] if she does not want to go to the ED. So someone can see her sooner than Thursday.   She says she does not feel bad enough to go to the ED or urgent care and will see me on Thursday.   Curlene Labrum, MD [facility] [address] [place], [place] [address] [phone] (office)

## 2018-09-15 ENCOUNTER — Other Ambulatory Visit: Payer: Self-pay

## 2018-09-15 ENCOUNTER — Encounter (HOSPITAL_COMMUNITY): Payer: Self-pay | Admitting: Emergency Medicine

## 2018-09-15 ENCOUNTER — Emergency Department (HOSPITAL_COMMUNITY)
Admission: EM | Admit: 2018-09-15 | Discharge: 2018-09-16 | Disposition: A | Discharge status: Home / Self Care | Payer: Medicaid Other | Attending: Emergency Medicine | Admitting: Emergency Medicine

## 2018-09-15 ENCOUNTER — Emergency Department (HOSPITAL_COMMUNITY): Payer: Medicaid Other

## 2018-09-15 ENCOUNTER — Ambulatory Visit
Admission: EM | Admit: 2018-09-15 | Discharge: 2018-09-15 | Discharge status: Absent without leave | Payer: Medicaid Other

## 2018-09-15 DIAGNOSIS — I10 Essential (primary) hypertension: Secondary | ICD-10-CM | POA: Diagnosis not present

## 2018-09-15 DIAGNOSIS — Z9049 Acquired absence of other specified parts of digestive tract: Secondary | ICD-10-CM | POA: Diagnosis not present

## 2018-09-15 DIAGNOSIS — N3289 Other specified disorders of bladder: Secondary | ICD-10-CM | POA: Diagnosis not present

## 2018-09-15 DIAGNOSIS — F17210 Nicotine dependence, cigarettes, uncomplicated: Secondary | ICD-10-CM | POA: Diagnosis not present

## 2018-09-15 DIAGNOSIS — R1011 Right upper quadrant pain: Secondary | ICD-10-CM | POA: Diagnosis not present

## 2018-09-15 DIAGNOSIS — Z79899 Other long term (current) drug therapy: Secondary | ICD-10-CM | POA: Insufficient documentation

## 2018-09-15 DIAGNOSIS — R160 Hepatomegaly, not elsewhere classified: Secondary | ICD-10-CM | POA: Diagnosis not present

## 2018-09-15 LAB — CBC WITH DIFFERENTIAL/PLATELET
Abs Immature Granulocytes: 0.03 10*3/uL (ref 0.00–0.07)
Basophils Absolute: 0 10*3/uL (ref 0.0–0.1)
Basophils Relative: 0 %
Eosinophils Absolute: 0.3 10*3/uL (ref 0.0–0.5)
Eosinophils Relative: 3 %
HCT: 37.3 % (ref 36.0–46.0)
Hemoglobin: 12 g/dL (ref 12.0–15.0)
Immature Granulocytes: 0 %
Lymphocytes Relative: 30 %
Lymphs Abs: 3 10*3/uL (ref 0.7–4.0)
MCH: 27.1 pg (ref 26.0–34.0)
MCHC: 32.2 g/dL (ref 30.0–36.0)
MCV: 84.4 fL (ref 80.0–100.0)
Monocytes Absolute: 0.4 10*3/uL (ref 0.1–1.0)
Monocytes Relative: 4 %
Neutro Abs: 6.2 10*3/uL (ref 1.7–7.7)
Neutrophils Relative %: 63 %
Platelets: 391 10*3/uL (ref 150–400)
RBC: 4.42 MIL/uL (ref 3.87–5.11)
RDW: 13.6 % (ref 11.5–15.5)
WBC: 10 10*3/uL (ref 4.0–10.5)
nRBC: 0 % (ref 0.0–0.2)

## 2018-09-15 LAB — COMPREHENSIVE METABOLIC PANEL
ALT: 15 U/L (ref 0–44)
AST: 15 U/L (ref 15–41)
Albumin: 4 g/dL (ref 3.5–5.0)
Alkaline Phosphatase: 71 U/L (ref 38–126)
Anion gap: 10 (ref 5–15)
BUN: 7 mg/dL (ref 6–20)
CO2: 26 mmol/L (ref 22–32)
Calcium: 8.8 mg/dL — ABNORMAL LOW (ref 8.9–10.3)
Chloride: 104 mmol/L (ref 98–111)
Creatinine, Ser: 0.76 mg/dL (ref 0.44–1.00)
GFR calc Af Amer: >60 mL/min (ref >60)
GFR calc non Af Amer: >60 mL/min (ref >60)
Glucose, Bld: 94 mg/dL (ref 70–99)
Potassium: 3.2 mmol/L — ABNORMAL LOW (ref 3.5–5.1)
Sodium: 140 mmol/L (ref 135–145)
Total Bilirubin: 0.2 mg/dL — ABNORMAL LOW (ref 0.3–1.2)
Total Protein: 7.1 g/dL (ref 6.5–8.1)

## 2018-09-15 LAB — URINALYSIS, ROUTINE W REFLEX MICROSCOPIC
Bilirubin Urine: NEGATIVE
Glucose, UA: NEGATIVE mg/dL
Hgb urine dipstick: NEGATIVE
Ketones, ur: NEGATIVE mg/dL
Leukocytes,Ua: NEGATIVE
Nitrite: NEGATIVE
Protein, ur: NEGATIVE mg/dL
Specific Gravity, Urine: 1.02 (ref 1.005–1.030)
pH: 5 (ref 5.0–8.0)

## 2018-09-15 MED ORDER — IOHEXOL 300 MG/ML  SOLN
100.0000 mL | Freq: Once | INTRAMUSCULAR | Status: AC | PRN
  Administered 2018-09-15: 100 mL via INTRAVENOUS

## 2018-09-15 MED ORDER — HYDROMORPHONE HCL 1 MG/ML IJ SOLN
1.0000 mg | Freq: Once | INTRAMUSCULAR | Status: AC
  Administered 2018-09-15: 22:00:00 1 mg via INTRAVENOUS
  Filled 2018-09-15: qty 1

## 2018-09-15 MED ORDER — OXYCODONE HCL 5 MG PO TABS: 5.0000 mg | ORAL_TABLET | ORAL | 0 refills | Status: AC | PRN

## 2018-09-15 MED ORDER — ONDANSETRON HCL 4 MG/2ML IJ SOLN
4.0000 mg | Freq: Once | INTRAMUSCULAR | Status: AC
  Administered 2018-09-15: 4 mg via INTRAVENOUS
  Filled 2018-09-15: qty 2

## 2018-09-15 MED ORDER — OXYCODONE HCL 5 MG PO TABS
5.0000 mg | ORAL_TABLET | Freq: Once | ORAL | Status: AC
  Administered 2018-09-16: 5 mg via ORAL
  Filled 2018-09-15: qty 1

## 2018-09-15 NOTE — ED Provider Notes (Signed)
[facility]ERGENCY [facility]PARTMENT Provider Note   CSN: [id number] Arrival date & time: 09/15/18  1703     History   Chief Complaint Chief Complaint  Patient presents with  . Abdominal Pain    HPI Leslie Lowery is a 48 y.o. female.     The history is provided by the patient. No language interpreter was used.  Abdominal Pain Pain location:  RUQ Pain quality: aching   Pain radiates to:  Does not radiate Pain severity:  Severe Onset quality:  Gradual Duration:  2 days Timing:  Constant Progression:  Worsening Chronicity:  New Relieved by:  Nothing Worsened by:  Nothing Ineffective treatments:  None tried Associated symptoms: nausea   Risk factors: no alcohol abuse   Pt had a laparoscopic cholecystectomy and a liver biopsy  on 6/9 by Dr. Henreitta LeberBridges.  Pt reports severe pain right upper abdomen.    Past Medical History:  Diagnosis Date  . Anxiety   . Disc disease, degenerative, cervical   . Hypertension     Patient Active Problem List   Diagnosis Date Noted  . Chronic cholecystitis   . Fatty liver   . Nausea vomiting and diarrhea   . RUQ abdominal pain 09/04/2018  . Hepatomegaly 09/04/2018  . Elevated BP without diagnosis of hypertension 09/04/2018    Past Surgical History:  Procedure Laterality Date  . CESAREAN SECTION     X 2  . CHOLECYSTECTOMY N/A 09/07/2018   Procedure: LAPAROSCOPIC CHOLECYSTECTOMY;  Surgeon: Lucretia RoersBridges, Lindsay C, MD;  Location: [facility];  Service: General;  Laterality: N/A;  . LIVER BIOPSY N/A 09/07/2018   Procedure: LAPAROSCOPIC  LIVER BIOPSY;  Surgeon: Lucretia RoersBridges, Lindsay C, MD;  Location: [facility];  Service: General;  Laterality: N/A;     OB History    Gravida  2   Para  2   Term  1   Preterm  1   AB      Living        SAB      TAB      Ectopic      Multiple      Live Births               Home Medications    Prior to Admission medications   Medication Sig Start Date End Date Taking? Authorizing Provider   acetaminophen (TYLENOL) 500 MG tablet Take 500 mg by mouth every 6 (six) hours as needed for mild pain or moderate pain.    [provider]  cyclobenzaprine (FLEXERIL) 5 MG tablet Take 1 tablet (5 mg total) by mouth 3 (three) times daily as needed for muscle spasms. 09/10/18   Lucretia RoersBridges, Lindsay C, MD  docusate sodium (COLACE) 100 MG capsule Take 1 capsule (100 mg total) by mouth 2 (two) times daily. 09/08/18   Lucretia RoersBridges, Lindsay C, MD  ibuprofen (ADVIL) 200 MG tablet Take 200 mg by mouth every 6 (six) hours as needed for mild pain or moderate pain.    [provider]  ondansetron (ZOFRAN) 4 MG tablet Take 1 tablet (4 mg total) by mouth every 6 (six) hours as needed for nausea. 09/08/18   Lucretia RoersBridges, Lindsay C, MD  oxyCODONE (OXY IR/ROXICODONE) 5 MG immediate release tablet Take 1 tablet (5 mg total) by mouth every 4 (four) hours as needed for severe pain or breakthrough pain. 09/11/18   Lucretia RoersBridges, Lindsay C, MD  pantoprazole (PROTONIX) 40 MG tablet Take 1 tablet (40 mg total) by mouth 2 (two) times daily.  09/08/18   Lucretia RoersBridges, Lindsay C, MD  sucralfate (CARAFATE) 1 GM/10ML suspension Take 10 mLs (1 g total) by mouth 4 (four) times daily -  with meals and at bedtime. 09/08/18   Lucretia RoersBridges, Lindsay C, MD    Family History Family History  Problem Relation Age of Onset  . Diabetes Mother   . CAD Mother   . CAD Father   . Diabetes Father   . Throat cancer Father   . Colon cancer Neg Hx   . Colon polyps Neg Hx        no first degree relatives with polyps  . Liver disease Neg Hx     Social History Social History   Tobacco Use  . Smoking status: Current Every Day Smoker    Packs/day: 1.00    Types: Cigarettes  . Smokeless tobacco: Never Used  Substance Use Topics  . Alcohol use: No  . Drug use: Yes    Types: Marijuana     Allergies   Patient has no known allergies.   Review of Systems Review of Systems  Gastrointestinal: Positive for abdominal pain and nausea.  All other  systems reviewed and are negative.    Physical Exam Updated Vital Signs BP (!) 132/95 (BP Location: Right Arm)   Pulse 96   Temp 98.6 F (37 C) (Oral)   Resp 20   Ht 5\' 3"  (1.6 m)   Wt 97.5 kg   SpO2 99%   BMI 38.09 kg/m   Physical Exam Vitals signs and nursing note reviewed.  Constitutional:      Appearance: Leslie Lowery is well-developed.  HENT:     Head: Normocephalic.  Neck:     Musculoskeletal: Normal range of motion.  Cardiovascular:     Rate and Rhythm: Normal rate.     Heart sounds: Normal heart sounds.  Pulmonary:     Effort: Pulmonary effort is normal.  Abdominal:     General: Abdomen is flat. A surgical scar is present. Bowel sounds are normal. There is no distension.     Palpations: Abdomen is soft.     Tenderness: There is abdominal tenderness in the right upper quadrant.  Musculoskeletal: Normal range of motion.  Skin:    General: Skin is warm.  Neurological:     General: No focal deficit present.     Mental Status: Leslie Lowery is alert and oriented to person, place, and time.  Psychiatric:        Mood and Affect: Mood normal.      ED Treatments / Results  Labs (all labs ordered are listed, but only abnormal results are displayed) Labs Reviewed - No data to display  EKG    Radiology Ct Abdomen Pelvis W Contrast  Result Date: 09/15/2018 CLINICAL DATA:  Right-sided abdominal pain after cholecystectomy and liver biopsy 1 week ago. EXAM: CT ABDOMEN AND PELVIS WITH CONTRAST TECHNIQUE: Multidetector CT imaging of the abdomen and pelvis was performed using the standard protocol following bolus administration of intravenous contrast. CONTRAST:  100mL OMNIPAQUE IOHEXOL 300 MG/ML  SOLN COMPARISON:  CT 09/04/2018 FINDINGS: Lower chest: Lower lobe atelectasis, left greater than right. No significant pleural fluid. Hepatobiliary: Interval cholecystectomy. Common bile duct measures 7 mm. Curvilinear/U-shaped fluid collection extends from gallbladder fossa surgical clips  inferior laterally the peritoneal surface of the right upper quadrant. More inferolateral component measures 4.8 x 2.0 x 2.0 cm, more medial component measures 3.3 x 1.4 x 1.6 cm. Majority of this appears extracapsular, a subcapsular component is also considered. No  significant peripheral enhancement, minimal surrounding inflammatory stranding. Again seen enlarged liver spanning 24 cm cranial caudal. Pancreas: No ductal dilatation or inflammation. Spleen: Normal in size without focal abnormality. Adrenals/Urinary Tract: Normal adrenal glands. No hydronephrosis or perinephric edema. Homogeneous renal enhancement with symmetric excretion on delayed phase imaging. Left renal stone on prior exam not well visualized currently. Urinary bladder is nondistended with persistent wall thickening. Stomach/Bowel: Stomach is within normal limits. No evidence of bowel wall thickening, distention, or inflammatory changes. No appendicitis, the appendix appears normal other than a faint hyperdensity in the appendiceal tip which may represent an appendicolith. No periappendiceal inflammation. Few scattered colonic diverticula in the sigmoid colon. Vascular/Lymphatic: Aorto bi-iliac atherosclerosis. No aneurysm. Portal vein and mesenteric vessels are patent. No adenopathy. Reproductive: Uterus and bilateral adnexa are unremarkable. Other: Fluid collection in the right upper quadrant as described. No other focal fluid collection. No free air. Postsurgical change in the anterior abdominal wall from recent laparoscopy, no subcutaneous fluid collection. Small fat containing umbilical hernia. Musculoskeletal: There are no acute or suspicious osseous abnormalities. IMPRESSION: 1. Post recent cholecystectomy. Small to moderate sized curvilinear/U-shaped fluid collection in the right upper quadrant extends from the gallbladder fossa surgical clips inferiorly and laterally. Differential considerations include postoperative seroma or hematoma,  however presence of internal air raises concern for superimposed infection/abscess. This collection appears extracapsular to the liver. 2. Urinary bladder wall thickening again seen. 3. Hepatomegaly. Electronically Signed   By: Keith Rake M.D.   On: 09/15/2018 22:59    Procedures Procedures (including critical care time)  Medications Ordered in ED Medications - No data to display   Initial Impression / Assessment and Plan / ED Course  I have reviewed the triage vital signs and the nursing notes.  Pertinent labs & imaging results that were available during my care of the patient were reviewed by me and considered in my medical decision making (see chart for details).          Final Clinical Impressions(s) / ED Diagnoses   Final diagnoses:  Right upper quadrant abdominal pain    ED Discharge Orders    None    /An After Visit Summary was printed and given to the patient.   Sidney Ace 09/16/18 2355    Milton Ferguson, MD 09/18/18 [phone]

## 2018-09-15 NOTE — ED Triage Notes (Signed)
Patient reports lower R abdominal pain and pain in her RUQ post chole last Tues.

## 2018-09-16 ENCOUNTER — Ambulatory Visit: Payer: Self-pay | Admitting: General Surgery

## 2018-09-21 ENCOUNTER — Telehealth (INDEPENDENT_AMBULATORY_CARE_PROVIDER_SITE_OTHER): Payer: Self-pay | Admitting: General Surgery

## 2018-09-21 ENCOUNTER — Other Ambulatory Visit: Payer: Self-pay

## 2018-09-21 DIAGNOSIS — K8020 Calculus of gallbladder without cholecystitis without obstruction: Secondary | ICD-10-CM

## 2018-09-21 NOTE — Progress Notes (Signed)
[facility]  I am calling the patient for post operative evaluation due to the current COVID 19 pandemic.  The patient had a laparoscopic cholecystectomy and liver biopsy on 09/07/2018  She has had pain issues post operatively. I refilled her roxicodone once for a total of 40 tabs from me, and the ED filled her pain medication again on 09/15/18. She had an in person follow up with me on 09/16/18 but did not show up to that appointment.  She had a CT and labs done in the ED on 6/17 that demonstrated normal LFTs, no leukocytosis and post op CT changes.   She says she did not have a ride on Thursday but says she could not get anyone to bring her. She is feeling much better. She says she is healing now.  She says her stomach is still hurting some in the middle on occasion. She says she is feeling much better.  She has been taking the Protonix and Carafate and this has soothed her some.  Her incisions are doing good but one of the port sites is a little red at the edge.  It is ok to put some neosporin on this at this time.  No fevers or chills. Having BMs. Appetite is returning.   She is going to GI on 10/27/18.  Told her to take tylenol as needed for pain.   Pathology: Diagnosis 1. Gallbladder - CHRONIC CHOLECYSTITIS 2. Liver, biopsy - LIVER PARENCHYMA WITH MILD STEATOSIS. SEE NOTE - NO PATHOLOGIC FIBROSIS  Future Appointments  Date Time Provider [place]  10/27/2018  8:30 AM Annitta Needs, NP [place], MD [facility] [address] [place], [place] [address] [phone] (office)

## 2018-10-03 ENCOUNTER — Other Ambulatory Visit: Payer: Self-pay

## 2018-10-03 ENCOUNTER — Emergency Department (HOSPITAL_COMMUNITY)
Admission: EM | Admit: 2018-10-03 | Discharge: 2018-10-03 | Disposition: A | Discharge status: Home / Self Care | Payer: Medicaid Other | Attending: Emergency Medicine | Admitting: Emergency Medicine

## 2018-10-03 ENCOUNTER — Encounter (HOSPITAL_COMMUNITY): Payer: Self-pay | Admitting: Emergency Medicine

## 2018-10-03 DIAGNOSIS — Z79899 Other long term (current) drug therapy: Secondary | ICD-10-CM | POA: Diagnosis not present

## 2018-10-03 DIAGNOSIS — F17210 Nicotine dependence, cigarettes, uncomplicated: Secondary | ICD-10-CM | POA: Insufficient documentation

## 2018-10-03 DIAGNOSIS — I10 Essential (primary) hypertension: Secondary | ICD-10-CM | POA: Diagnosis not present

## 2018-10-03 DIAGNOSIS — R1030 Lower abdominal pain, unspecified: Secondary | ICD-10-CM | POA: Insufficient documentation

## 2018-10-03 LAB — CBC
HCT: 40.1 % (ref 36.0–46.0)
Hemoglobin: 12.9 g/dL (ref 12.0–15.0)
MCH: 26.7 pg (ref 26.0–34.0)
MCHC: 32.2 g/dL (ref 30.0–36.0)
MCV: 82.9 fL (ref 80.0–100.0)
Platelets: 315 10*3/uL (ref 150–400)
RBC: 4.84 MIL/uL (ref 3.87–5.11)
RDW: 13.4 % (ref 11.5–15.5)
WBC: 9.7 10*3/uL (ref 4.0–10.5)
nRBC: 0 % (ref 0.0–0.2)

## 2018-10-03 LAB — URINALYSIS, ROUTINE W REFLEX MICROSCOPIC
Bilirubin Urine: NEGATIVE
Glucose, UA: NEGATIVE mg/dL
Hgb urine dipstick: NEGATIVE
Ketones, ur: NEGATIVE mg/dL
Leukocytes,Ua: NEGATIVE
Nitrite: NEGATIVE
Protein, ur: NEGATIVE mg/dL
Specific Gravity, Urine: 1.009 (ref 1.005–1.030)
pH: 6 (ref 5.0–8.0)

## 2018-10-03 LAB — COMPREHENSIVE METABOLIC PANEL
ALT: 15 U/L (ref 0–44)
AST: 14 U/L — ABNORMAL LOW (ref 15–41)
Albumin: 4.1 g/dL (ref 3.5–5.0)
Alkaline Phosphatase: 93 U/L (ref 38–126)
Anion gap: 10 (ref 5–15)
BUN: 11 mg/dL (ref 6–20)
CO2: 26 mmol/L (ref 22–32)
Calcium: 9.1 mg/dL (ref 8.9–10.3)
Chloride: 107 mmol/L (ref 98–111)
Creatinine, Ser: 0.76 mg/dL (ref 0.44–1.00)
GFR calc Af Amer: >60 mL/min (ref >60)
GFR calc non Af Amer: >60 mL/min (ref >60)
Glucose, Bld: 101 mg/dL — ABNORMAL HIGH (ref 70–99)
Potassium: 3.8 mmol/L (ref 3.5–5.1)
Sodium: 143 mmol/L (ref 135–145)
Total Bilirubin: 0.5 mg/dL (ref 0.3–1.2)
Total Protein: 7.3 g/dL (ref 6.5–8.1)

## 2018-10-03 LAB — LIPASE, BLOOD: Lipase: 29 U/L (ref 11–51)

## 2018-10-03 LAB — PREGNANCY, URINE: Preg Test, Ur: NEGATIVE

## 2018-10-03 MED ORDER — ONDANSETRON HCL 4 MG/2ML IJ SOLN
4.0000 mg | Freq: Once | INTRAMUSCULAR | Status: AC
  Administered 2018-10-03: 4 mg via INTRAVENOUS
  Filled 2018-10-03: qty 2

## 2018-10-03 MED ORDER — MORPHINE SULFATE (PF) 4 MG/ML IV SOLN
4.0000 mg | Freq: Once | INTRAVENOUS | Status: AC
  Administered 2018-10-03: 19:00:00 4 mg via INTRAVENOUS
  Filled 2018-10-03: qty 1

## 2018-10-03 MED ORDER — DICYCLOMINE HCL 10 MG PO CAPS
20.0000 mg | ORAL_CAPSULE | Freq: Once | ORAL | Status: AC
  Administered 2018-10-03: 20 mg via ORAL
  Filled 2018-10-03: qty 2

## 2018-10-03 MED ORDER — OXYCODONE-ACETAMINOPHEN 5-325 MG PO TABS: 1.0000 | ORAL_TABLET | ORAL | 0 refills | Status: DC | PRN

## 2018-10-03 MED ORDER — DICYCLOMINE HCL 20 MG PO TABS: 20.0000 mg | ORAL_TABLET | Freq: Three times a day (TID) | ORAL | 0 refills | Status: DC | PRN

## 2018-10-03 MED ORDER — FENTANYL CITRATE (PF) 100 MCG/2ML IJ SOLN
50.0000 ug | Freq: Once | INTRAMUSCULAR | Status: AC
  Administered 2018-10-03: 21:00:00 50 ug via INTRAVENOUS
  Filled 2018-10-03: qty 2

## 2018-10-03 NOTE — ED Triage Notes (Signed)
Patient complains of abdominal pain x2 days. Endorses nausea without vomiting. Denies fevers and diarrhea.

## 2018-10-03 NOTE — Discharge Instructions (Addendum)
Followup with the GI specialist you are scheduled to see on July 29.  Your lab tests tonight are normal. We have spoken with your surgeon tonight and agrees with proceeding with evaluation by Gastroenterology.  We are prescribing your bentyl which is a medicine that can help with intestinal cramping and pain.  Continue using your carafate since this also gives you some relief.

## 2018-10-03 NOTE — ED Provider Notes (Signed)
[facility] Provider Note   CSN: [id number] Arrival date & time: 10/03/18  1712    History   Chief Complaint Chief Complaint  Patient presents with  . Abdominal Pain    HPI Leslie Lowery is a 48 y.o. female with a history of anxiety, fatty liver, hepatomegaly, hypertension and patient is currently 1 month out from a laparoscopic cholecystectomy presenting tonight with continued abdominal pain.  She endorses having persistent abdominal pain since prior to her surgery, with episodes occurring since last year and states that her pain is in one sense improved since surgery but continues to have episodic spells of mid to lower abdominal pain described as sharp, cramping, sometimes stabbing and she has found no alleviators or aggravators, although does report yesterday her symptoms started after eating [place] food. Her symptoms can last for several days. She is on Carafate which was prescribed by Dr. Henreitta LeberBridges, she states that this medication helps with the burning upper abdominal symptoms but does not improve the lower complaints.  She denies fevers or chills, she is nauseated without emesis and denies any changes in bowel or bladder.  She has found other alleviators for her symptoms.  She is scheduled to see GI on 10/27/18 for further testing.    The history is provided by the patient.    Past Medical History:  Diagnosis Date  . Anxiety   . Disc disease, degenerative, cervical   . Hypertension     Patient Active Problem List   Diagnosis Date Noted  . Chronic cholecystitis   . Fatty liver   . Nausea vomiting and diarrhea   . RUQ abdominal pain 09/04/2018  . Hepatomegaly 09/04/2018  . Elevated BP without diagnosis of hypertension 09/04/2018    Past Surgical History:  Procedure Laterality Date  . CESAREAN SECTION     X 2  . CHOLECYSTECTOMY N/A 09/07/2018   Procedure: LAPAROSCOPIC CHOLECYSTECTOMY;  Surgeon: Lucretia RoersBridges, Lindsay C, MD;  Location: [facility];  Service:  General;  Laterality: N/A;  . LIVER BIOPSY N/A 09/07/2018   Procedure: LAPAROSCOPIC  LIVER BIOPSY;  Surgeon: Lucretia RoersBridges, Lindsay C, MD;  Location: [facility];  Service: General;  Laterality: N/A;     OB History    Gravida  2   Para  2   Term  1   Preterm  1   AB      Living        SAB      TAB      Ectopic      Multiple      Live Births               Home Medications    Prior to Admission medications   Medication Sig Start Date End Date Taking? Authorizing Provider  acetaminophen (TYLENOL) 500 MG tablet Take 500 mg by mouth every 6 (six) hours as needed for mild pain or moderate pain.   Yes [provider]  Aspirin-Salicylamide-Caffeine (BC HEADACHE) 325-95-16 MG TABS Take 1-2 packets by mouth daily as needed (for pain).   Yes [provider]  ondansetron (ZOFRAN) 4 MG tablet Take 1 tablet (4 mg total) by mouth every 6 (six) hours as needed for nausea. 09/08/18  Yes Lucretia RoersBridges, Lindsay C, MD  sucralfate (CARAFATE) 1 GM/10ML suspension Take 10 mLs (1 g total) by mouth 4 (four) times daily -  with meals and at bedtime. 09/08/18  Yes Lucretia RoersBridges, Lindsay C, MD  cyclobenzaprine (FLEXERIL) 5 MG tablet Take 1 tablet (5  mg total) by mouth 3 (three) times daily as needed for muscle spasms. Patient not taking: Reported on 10/03/2018 09/10/18   Virl Cagey, MD  dicyclomine (BENTYL) 20 MG tablet Take 1 tablet (20 mg total) by mouth 3 (three) times daily as needed for spasms (intestinal spasm). 10/03/18   Evalee Jefferson, PA-C  docusate sodium (COLACE) 100 MG capsule Take 1 capsule (100 mg total) by mouth 2 (two) times daily. Patient not taking: Reported on 09/15/2018 09/08/18   Virl Cagey, MD  oxyCODONE-acetaminophen (PERCOCET/ROXICET) 5-325 MG tablet Take 1 tablet by mouth every 4 (four) hours as needed. 10/03/18   Evalee Jefferson, PA-C  pantoprazole (PROTONIX) 40 MG tablet Take 1 tablet (40 mg total) by mouth 2 (two) times daily. Patient not taking: Reported on 09/15/2018  09/08/18   Virl Cagey, MD    Family History Family History  Problem Relation Age of Onset  . Diabetes Mother   . CAD Mother   . CAD Father   . Diabetes Father   . Throat cancer Father   . Colon cancer Neg Hx   . Colon polyps Neg Hx        no first degree relatives with polyps  . Liver disease Neg Hx     Social History Social History   Tobacco Use  . Smoking status: Current Every Day Smoker    Packs/day: 1.00    Types: Cigarettes  . Smokeless tobacco: Never Used  Substance Use Topics  . Alcohol use: No  . Drug use: Yes    Types: Marijuana    Comment: last smoked 3-4 wks ago     Allergies   Patient has no known allergies.   Review of Systems Review of Systems  Constitutional: Negative for fever.  HENT: Negative for congestion and sore throat.   Eyes: Negative.   Respiratory: Negative for chest tightness and shortness of breath.   Cardiovascular: Negative for chest pain.  Gastrointestinal: Positive for abdominal pain and nausea. Negative for diarrhea and vomiting.  Genitourinary: Negative.   Musculoskeletal: Negative for arthralgias, joint swelling and neck pain.  Skin: Negative.  Negative for rash and wound.  Neurological: Negative for dizziness, weakness, light-headedness, numbness and headaches.  Psychiatric/Behavioral: Negative.      Physical Exam Updated Vital Signs BP 137/88 (BP Location: Right Arm)   Pulse 88   Temp 98.7 F (37.1 C) (Oral)   Resp 17   Ht 5\' 3"  (1.6 m)   Wt 99.8 kg   SpO2 99%   BMI 38.97 kg/m   Physical Exam Vitals signs and nursing note reviewed.  Constitutional:      Appearance: She is well-developed.  HENT:     Head: Normocephalic and atraumatic.  Eyes:     Conjunctiva/sclera: Conjunctivae normal.  Neck:     Musculoskeletal: Normal range of motion.  Cardiovascular:     Rate and Rhythm: Normal rate and regular rhythm.     Heart sounds: Normal heart sounds.  Pulmonary:     Effort: Pulmonary effort is normal.      Breath sounds: Normal breath sounds. No wheezing.  Abdominal:     General: Bowel sounds are normal.     Palpations: Abdomen is soft.     Tenderness: There is abdominal tenderness in the periumbilical area. There is no guarding or rebound.     Hernia: No hernia is present.     Comments: Well-healed surgical incisions from laparoscopic cholecystectomy.  Musculoskeletal: Normal range of motion.  Skin:  General: Skin is warm and dry.  Neurological:     Mental Status: She is alert.      ED Treatments / Results  Labs (all labs ordered are listed, but only abnormal results are displayed) Labs Reviewed  COMPREHENSIVE METABOLIC PANEL - Abnormal; Notable for the following components:      Result Value   Glucose, Bld 101 (*)    AST 14 (*)    All other components within normal limits  URINALYSIS, ROUTINE W REFLEX MICROSCOPIC - Abnormal; Notable for the following components:   Color, Urine STRAW (*)    APPearance HAZY (*)    All other components within normal limits  LIPASE, BLOOD  CBC  PREGNANCY, URINE    EKG None  Radiology No results found.  Procedures Procedures (including critical care time)  Medications Ordered in ED Medications  ondansetron (ZOFRAN) injection 4 mg (4 mg Intravenous Given 10/03/18 1915)  morphine 4 MG/ML injection 4 mg (4 mg Intravenous Given 10/03/18 1916)  fentaNYL (SUBLIMAZE) injection 50 mcg (50 mcg Intravenous Given 10/03/18 2059)  dicyclomine (BENTYL) capsule 20 mg (20 mg Oral Given 10/03/18 2120)     Initial Impression / Assessment and Plan / ED Course  I have reviewed the triage vital signs and the nursing notes.  Pertinent labs & imaging results that were available during my care of the patient were reviewed by me and considered in my medical decision making (see chart for details).        Patient with acute on chronic abdominal pain.  Labs today are stable.  Review of her recent CT scan which was done postsurgical on June 17th was  reviewed.  Also as discussed these findings with Dr. Henreitta LeberBridges general surgery.  Patient was felt stable for discharge home with follow-up with GI which is scheduled for later this month.  It is possible her symptoms are due to intestinal spasm given normal labs, intermittent nature of the symptoms.  She was placed on Bentyl with first dose given here.  She advised plan follow-up with GI as planned.  She was given a few oxycodone tablets after reviewing the [facility] narcotic database.  Strict return precautions were discussed.  She has no findings to suggest an acute abdomen.  Final Clinical Impressions(s) / ED Diagnoses   Final diagnoses:  Lower abdominal pain    ED Discharge Orders         Ordered    dicyclomine (BENTYL) 20 MG tablet  3 times daily PRN     10/03/18 2114    oxyCODONE-acetaminophen (PERCOCET/ROXICET) 5-325 MG tablet  Every 4 hours PRN     10/03/18 2140           Burgess Amordol, Amad Mau, PA-C 10/03/18 2351    Bethann BerkshireZammit, Joseph, MD 10/06/18 [phone]

## 2018-10-27 ENCOUNTER — Ambulatory Visit (INDEPENDENT_AMBULATORY_CARE_PROVIDER_SITE_OTHER): Payer: Medicaid Other | Admitting: Gastroenterology

## 2018-10-27 ENCOUNTER — Emergency Department (HOSPITAL_COMMUNITY)
Admission: EM | Admit: 2018-10-27 | Discharge: 2018-10-28 | Disposition: A | Discharge status: Home / Self Care | Payer: Medicaid Other | Attending: Emergency Medicine | Admitting: Emergency Medicine

## 2018-10-27 ENCOUNTER — Other Ambulatory Visit: Payer: Self-pay | Admitting: *Deleted

## 2018-10-27 ENCOUNTER — Encounter: Payer: Self-pay | Admitting: *Deleted

## 2018-10-27 ENCOUNTER — Encounter (HOSPITAL_COMMUNITY): Payer: Self-pay | Admitting: *Deleted

## 2018-10-27 ENCOUNTER — Encounter: Payer: Self-pay | Admitting: Gastroenterology

## 2018-10-27 ENCOUNTER — Other Ambulatory Visit: Payer: Self-pay

## 2018-10-27 ENCOUNTER — Telehealth: Payer: Self-pay | Admitting: *Deleted

## 2018-10-27 ENCOUNTER — Telehealth: Payer: Self-pay | Admitting: Gastroenterology

## 2018-10-27 ENCOUNTER — Emergency Department (HOSPITAL_COMMUNITY): Payer: Medicaid Other

## 2018-10-27 VITALS — BP 138/92 | HR 89 | Temp 98.2°F | Ht 63.0 in | Wt 240.2 lb

## 2018-10-27 DIAGNOSIS — R112 Nausea with vomiting, unspecified: Secondary | ICD-10-CM | POA: Diagnosis not present

## 2018-10-27 DIAGNOSIS — K760 Fatty (change of) liver, not elsewhere classified: Secondary | ICD-10-CM

## 2018-10-27 DIAGNOSIS — R109 Unspecified abdominal pain: Secondary | ICD-10-CM | POA: Diagnosis not present

## 2018-10-27 DIAGNOSIS — F17210 Nicotine dependence, cigarettes, uncomplicated: Secondary | ICD-10-CM | POA: Insufficient documentation

## 2018-10-27 DIAGNOSIS — R1031 Right lower quadrant pain: Secondary | ICD-10-CM

## 2018-10-27 DIAGNOSIS — I10 Essential (primary) hypertension: Secondary | ICD-10-CM | POA: Diagnosis not present

## 2018-10-27 DIAGNOSIS — R102 Pelvic and perineal pain: Secondary | ICD-10-CM

## 2018-10-27 DIAGNOSIS — R1013 Epigastric pain: Secondary | ICD-10-CM | POA: Diagnosis not present

## 2018-10-27 DIAGNOSIS — R197 Diarrhea, unspecified: Secondary | ICD-10-CM | POA: Diagnosis not present

## 2018-10-27 LAB — COMPREHENSIVE METABOLIC PANEL
ALT: 22 U/L (ref 0–44)
AST: 18 U/L (ref 15–41)
Albumin: 4.3 g/dL (ref 3.5–5.0)
Alkaline Phosphatase: 81 U/L (ref 38–126)
Anion gap: 9 (ref 5–15)
BUN: 13 mg/dL (ref 6–20)
CO2: 23 mmol/L (ref 22–32)
Calcium: 8.6 mg/dL — ABNORMAL LOW (ref 8.9–10.3)
Chloride: 106 mmol/L (ref 98–111)
Creatinine, Ser: 0.75 mg/dL (ref 0.44–1.00)
GFR calc Af Amer: >60 mL/min (ref >60)
GFR calc non Af Amer: >60 mL/min (ref >60)
Glucose, Bld: 106 mg/dL — ABNORMAL HIGH (ref 70–99)
Potassium: 3.6 mmol/L (ref 3.5–5.1)
Sodium: 138 mmol/L (ref 135–145)
Total Bilirubin: 0.2 mg/dL — ABNORMAL LOW (ref 0.3–1.2)
Total Protein: 7.2 g/dL (ref 6.5–8.1)

## 2018-10-27 LAB — CBC
HCT: 40.4 % (ref 36.0–46.0)
Hemoglobin: 12.8 g/dL (ref 12.0–15.0)
MCH: 26.8 pg (ref 26.0–34.0)
MCHC: 31.7 g/dL (ref 30.0–36.0)
MCV: 84.7 fL (ref 80.0–100.0)
Platelets: 292 10*3/uL (ref 150–400)
RBC: 4.77 MIL/uL (ref 3.87–5.11)
RDW: 14.2 % (ref 11.5–15.5)
WBC: 8.4 10*3/uL (ref 4.0–10.5)
nRBC: 0.5 % — ABNORMAL HIGH (ref 0.0–0.2)

## 2018-10-27 LAB — LIPASE, BLOOD: Lipase: 22 U/L (ref 11–51)

## 2018-10-27 MED ORDER — SODIUM CHLORIDE 0.9% FLUSH
3.0000 mL | Freq: Once | INTRAVENOUS | Status: AC
  Administered 2018-10-28: 3 mL via INTRAVENOUS

## 2018-10-27 MED ORDER — SODIUM CHLORIDE 0.9 % IV BOLUS
1000.0000 mL | Freq: Once | INTRAVENOUS | Status: AC
  Administered 2018-10-28: 1000 mL via INTRAVENOUS

## 2018-10-27 MED ORDER — IOHEXOL 300 MG/ML  SOLN
100.0000 mL | Freq: Once | INTRAMUSCULAR | Status: AC | PRN
  Administered 2018-10-28: 100 mL via INTRAVENOUS

## 2018-10-27 MED ORDER — HYOSCYAMINE SULFATE 0.125 MG SL SUBL: 0.1250 mg | SUBLINGUAL_TABLET | SUBLINGUAL | 1 refills | Status: DC | PRN

## 2018-10-27 MED ORDER — MORPHINE SULFATE (PF) 4 MG/ML IV SOLN
6.0000 mg | Freq: Once | INTRAVENOUS | Status: AC
  Administered 2018-10-28: 6 mg via INTRAVENOUS
  Filled 2018-10-27: qty 2

## 2018-10-27 NOTE — ED Notes (Signed)
ED Provider at bedside. 

## 2018-10-27 NOTE — Assessment & Plan Note (Signed)
48 year old female with history of epigastric pain, NSAID use (Ibuprofen and Goody powders), s/p cholecystectomy and persistent vague symptoms. Currently not taking PPI as directed. Needs EGD for diagnostic purposes as previously planned when seen as inpatient prior to cholecystectomy.  Proceed with upper endoscopy in the near future with Dr. Gala Romney. The risks, benefits, and alternatives have been discussed in detail with patient. They have stated understanding and desire to proceed.  Propofol due to polypharmacy Resume PPI BID.

## 2018-10-27 NOTE — ED Notes (Signed)
Pt was informed we need urine sample. 

## 2018-10-27 NOTE — Assessment & Plan Note (Signed)
Unclear etiology. CT reviewed with Dr. Constance Haw as well. No concern for appendicitis. Labs unrevealing. Chronic history of intermittent loose stools, present prior to cholecystectomy. Bentyl without much improvement. Trial of Levsin. Concern for drug-seeking behaviors. Consider colonoscopy after upper GI symptoms addressed.

## 2018-10-27 NOTE — Telephone Encounter (Signed)
Pre-op appt information mailed to patient

## 2018-10-27 NOTE — Progress Notes (Signed)
Primary Care Physician:  Patient, No Pcp Per Primary Gastroenterologist:  Dr. Jena Gaussourk   Chief Complaint  Patient presents with  . fatty liver  . Abdominal Pain    belly button and down into lower right  . Nausea    no vomiting  . Diarrhea    3-4 times per day    HPI:   Leslie MullingKimberly D Lowery is a 48 y.o. female presenting today in hospital follow-up after admission in June 2020 for abdominal pain, s/p cholecystectomy. Fatty liver and hepatomegaly on imaging, with normal LFTs. Hepatomegaly likely due to fatty liver disease. No evidence of enlarged spleen or thrombocytopenia. Mild steatosis on liver biopsy at time of cholecystectomy. Due to increased NSAIDs, consideration raised for EGD as outpatient if persistent epigastric pain. Previously taking Ibuprofen 3 times a day and Goody powders at least once a day.  Since early July 2020 has had RLQ pain. The past few days have had more constant RLQ pain that radiates across to her umbilicus. Constant. Hard to rest. Moving around causes worsening of discomfort. Sometimes stabbing pain but then will ease off but still there. Feels nauseated and no appetite. Doesn't have a taste for food. Eating causes discomfort in upper abdomen. History of loose stools prior to cholecystectomy, depending on what she ate. If larger amounts would have more loose stool. Watery stool 3-4 times per day, intermittent. NOT all the time.  Prescribed Bentyl at ED. Took 4-5 days but didn't help with abdominal discomfort or loose stool. For a year or two has had looser stool, intermittent loose stool, ribbon-like stool. No weight loss. Gaining weight in past 1.5 years.   Not taking Goody powders "as much" but still taking Ibuprofen. Protonix: not taking on a regular basis. Helps stop heartburn. Was taking BID when first returning home from hospital but now just as needed.   Wants to feel better. Will have cold sweats at times but no fever.   CT abd/pelvis with contrast  June 2020 with post-op changes, possible appendicolith and reviewed with Dr. Henreitta LeberBridges. Unclear if true appendicolith as very faint and no signs of appendicitis. Patient with drug-seeking behaviors.     Past Medical History:  Diagnosis Date  . Anxiety   . Disc disease, degenerative, cervical   . Hypertension     Past Surgical History:  Procedure Laterality Date  . CESAREAN SECTION     X 2  . CHOLECYSTECTOMY N/A 09/07/2018   Procedure: LAPAROSCOPIC CHOLECYSTECTOMY;  Surgeon: Lucretia RoersBridges, Lindsay C, MD;  Location: [facility];  Service: General;  Laterality: N/A;  . LIVER BIOPSY N/A 09/07/2018   Procedure: LAPAROSCOPIC  LIVER BIOPSY;  Surgeon: Lucretia RoersBridges, Lindsay C, MD;  Location: [facility];  Service: General;  Laterality: N/A;    Current Outpatient Medications  Medication Sig Dispense Refill  . Aspirin-Salicylamide-Caffeine (BC HEADACHE) 325-95-16 MG TABS Take 1-2 packets by mouth daily as needed (for pain).    Marland Kitchen. ibuprofen (ADVIL) 200 MG tablet Take 200 mg by mouth every 6 (six) hours as needed.    . ondansetron (ZOFRAN) 4 MG tablet Take 1 tablet (4 mg total) by mouth every 6 (six) hours as needed for nausea. 20 tablet 0  . pantoprazole (PROTONIX) 40 MG tablet Take 1 tablet (40 mg total) by mouth 2 (two) times daily. (Patient taking differently: Take 40 mg by mouth as needed. ) 60 tablet 1  . hyoscyamine (LEVSIN SL) 0.125 MG SL tablet Place 1 tablet (0.125 mg total) under the tongue  every 4 (four) hours as needed. For abdominal discomfort 60 tablet 1   No current facility-administered medications for this visit.     Allergies as of 10/27/2018  . (No Known Allergies)    Family History  Problem Relation Age of Onset  . Diabetes Mother   . CAD Mother   . CAD Father   . Diabetes Father   . Throat cancer Father   . Colon cancer Neg Hx   . Colon polyps Neg Hx        no first degree relatives with polyps  . Liver disease Neg Hx     Social History   Socioeconomic History  . Marital status:  Single    Spouse name: Not on file  . Number of children: Not on file  . Years of education: Not on file  . Highest education level: Not on file  Occupational History  . Not on file  Social Needs  . Financial resource strain: Not on file  . Food insecurity    Worry: Not on file    Inability: Not on file  . Transportation needs    Medical: Not on file    Non-medical: Not on file  Tobacco Use  . Smoking status: Current Every Day Smoker    Packs/day: 1.00    Types: Cigarettes  . Smokeless tobacco: Never Used  Substance and Sexual Activity  . Alcohol use: No  . Drug use: Yes    Types: Marijuana    Comment: last smoked 3-4 wks ago  . Sexual activity: Not on file  Lifestyle  . Physical activity    Days per week: Not on file    Minutes per session: Not on file  . Stress: Not on file  Relationships  . Social Herbalist on phone: Not on file    Gets together: Not on file    Attends religious service: Not on file    Active member of club or organization: Not on file    Attends meetings of clubs or organizations: Not on file    Relationship status: Not on file  . Intimate partner violence    Fear of current or ex partner: Not on file    Emotionally abused: Not on file    Physically abused: Not on file    Forced sexual activity: Not on file  Other Topics Concern  . Not on file  Social History Narrative  . Not on file    Review of Systems: Gen: Denies any fever, chills, fatigue, weight loss, lack of appetite.  CV: Denies chest pain, heart palpitations, peripheral edema, syncope.  Resp: Denies shortness of breath at rest or with exertion. Denies wheezing or cough.  GI: see HPI GU : Denies urinary burning, urinary frequency, urinary hesitancy MS: Denies joint pain, muscle weakness, cramps, or limitation of movement.  Derm: Denies rash, itching, dry skin Psych: Denies depression, anxiety, memory loss, and confusion Heme: Denies bruising, bleeding, and enlarged  lymph nodes.  Physical Exam: BP (!) 138/92   Pulse 89   Temp 98.2 F (36.8 C) (Oral)   Ht 5\' 3"  (1.6 m)   Wt 240 lb 3.2 oz (109 kg)   BMI 42.55 kg/m  General:   Alert and oriented. Pleasant and cooperative. Well-nourished and well-developed.  Head:  Normocephalic and atraumatic. Eyes:  Without icterus, sclera clear and conjunctiva pink.  Ears:  Normal auditory acuity. Nose:  No deformity, discharge,  or lesions. Mouth:  No deformity or lesions,  oral mucosa pink.  Lungs:  Clear to auscultation bilaterally. Heart:  S1, S2 present without murmurs appreciated.  Abdomen:  +BS, soft, obese, mild TTP RLQ/umbilical and non-distended. Large AP diameter, difficult to assess for HSM.  Rectal:  Deferred  Msk:  Symmetrical without gross deformities. Normal posture. Extremities:  Without  edema. Neurologic:  Alert and  oriented x4 Psych:  Alert and cooperative. Normal mood and affect.

## 2018-10-27 NOTE — ED Triage Notes (Signed)
Pt with right lower abd pain for past 3 days with N/V/D, loose stools several times today.  Emesis x 2 today. Denies fever

## 2018-10-27 NOTE — Telephone Encounter (Signed)
Please have patient return in 2-3 months for follow-up fatty liver.  Alicia: please let patient know I reviewed with Dr. Constance Haw. Continue supportive measures for now.

## 2018-10-27 NOTE — Assessment & Plan Note (Signed)
Normal LFTs. Liver biopsy at time of cholecystectomy with steatosis. No evidence for enlarged spleen or thrombocytopenia. Needs dietary/behavior modifications. Return in 3 months to discuss in more detail after acute issues addressed.

## 2018-10-27 NOTE — ED Notes (Signed)
Pt was seen at [facility] earlier today, please see office note

## 2018-10-27 NOTE — Patient Instructions (Signed)
I have ordered a complete blood count today to make sure you don't have an increase in your white blood cell count that could indicate infection.  Please take Protonix (pantoprazole) twice a day, 30 minutes before breakfast and dinner.   I have sent in Levsin to take as needed for abdominal cramping.  We have arranged an upper endoscopy with Dr. Gala Romney in the near future.  I'm discussing with Dr. Constance Haw further management as well.   I enjoyed seeing you again today! As you know, I value our relationship and want to provide genuine, compassionate, and quality care. I welcome your feedback. If you receive a survey regarding your visit,  I greatly appreciate you taking time to fill this out. See you next time!  Annitta Needs, PhD, ANP-BC [facility]

## 2018-10-27 NOTE — Progress Notes (Signed)
No pcp per patient 

## 2018-10-28 ENCOUNTER — Encounter: Payer: Self-pay | Admitting: Gastroenterology

## 2018-10-28 DIAGNOSIS — R109 Unspecified abdominal pain: Secondary | ICD-10-CM | POA: Diagnosis not present

## 2018-10-28 LAB — URINALYSIS, ROUTINE W REFLEX MICROSCOPIC
Bilirubin Urine: NEGATIVE
Glucose, UA: NEGATIVE mg/dL
Hgb urine dipstick: NEGATIVE
Ketones, ur: NEGATIVE mg/dL
Leukocytes,Ua: NEGATIVE
Nitrite: NEGATIVE
Protein, ur: NEGATIVE mg/dL
Specific Gravity, Urine: 1.02 (ref 1.005–1.030)
pH: 5 (ref 5.0–8.0)

## 2018-10-28 LAB — PREGNANCY, URINE: Preg Test, Ur: NEGATIVE

## 2018-10-28 MED ORDER — KETOROLAC TROMETHAMINE 30 MG/ML IJ SOLN
30.0000 mg | Freq: Once | INTRAMUSCULAR | Status: AC
  Administered 2018-10-28: 03:00:00 30 mg via INTRAVENOUS
  Filled 2018-10-28: qty 1

## 2018-10-28 NOTE — ED Notes (Signed)
Pt tolerates PO Fluids, but reports Lower Right Abdominal Pain is worsening.

## 2018-10-28 NOTE — ED Provider Notes (Signed)
Care assumed from Dr. Wilson Singer.  Patient sent from GI office with right lower quadrant pain, pending CT scan to evaluate for appendicitis.  Appendix normal on CT scan. There is a resolving fluid collection seen in the gallbladder.  D/w Dr/ Constance Haw. She states fluid collection has been seen previously. No fever. No WBC count.  No need for intervention.   Will arrange outpatient pelvic US to evaluate ovary. Doubt torsion with ongoing pain for 3-4 days.  Patient agrees with plan to return later today for pelvic ultrasound.  Follow-up with her GI specialist.  Return precautions discussed.   Leslie Essex, MD 10/28/18 [phone]

## 2018-10-28 NOTE — Discharge Instructions (Signed)
Your testing shows normal appendix.  There is still some fluid around her gallbladder which is resolving.  Dr. Constance Haw is aware of this.  Follow-up tomorrow for an ultrasound of your uterus and ovaries.  Return to the ED if worsening pain, fever, vomiting or any other concerns.

## 2018-10-28 NOTE — ED Provider Notes (Signed)
[facility] EMERGENCY DEPARTMENT Provider Note   CSN: [id number] Arrival date & time: 10/27/18  1601     History   Chief Complaint Chief Complaint  Patient presents with  . Abdominal Pain    HPI Quentin MullingKimberly D Clinger is a 48 y.o. female.     HPI   48 year old female with abdominal pain.  Right lower quadrant.  She states this began 3 days ago.  Constant progressive since then.  Associated with loose stools and nausea/vomiting.  She has not noticed any appreciable exacerbating factors.  No urinary complaints.  No fevers or chills.  No blood in stool.  Of note, the patient with recent cholecystectomy and evaluation for abdominal pain after this.  She was actually seen by [facility]ology earlier today.  His description of her symptoms does not correlate with their office note today.  Past Medical History:  Diagnosis Date  . Anxiety   . Disc disease, degenerative, cervical   . Hypertension     Patient Active Problem List   Diagnosis Date Noted  . Dyspepsia 10/27/2018  . RLQ abdominal pain 10/27/2018  . Chronic cholecystitis   . Fatty liver   . Nausea vomiting and diarrhea   . RUQ abdominal pain 09/04/2018  . Hepatomegaly 09/04/2018  . Elevated BP without diagnosis of hypertension 09/04/2018    Past Surgical History:  Procedure Laterality Date  . CESAREAN SECTION     X 2  . CHOLECYSTECTOMY N/A 09/07/2018   Procedure: LAPAROSCOPIC CHOLECYSTECTOMY;  Surgeon: Lucretia RoersBridges, Lindsay C, MD;  Location: [facility];  Service: General;  Laterality: N/A;  . LIVER BIOPSY N/A 09/07/2018   Procedure: LAPAROSCOPIC  LIVER BIOPSY;  Surgeon: Lucretia RoersBridges, Lindsay C, MD;  Location: [facility];  Service: General;  Laterality: N/A;     OB History    Gravida  2   Para  2   Term  1   Preterm  1   AB      Living        SAB      TAB      Ectopic      Multiple      Live Births               Home Medications    Prior to Admission medications   Medication Sig Start Date End  Date Taking? Authorizing Provider  Aspirin-Salicylamide-Caffeine (BC HEADACHE) 325-95-16 MG TABS Take 1-2 packets by mouth daily as needed (for pain).   Yes [provider]  ibuprofen (ADVIL) 200 MG tablet Take 200 mg by mouth every 6 (six) hours as needed for moderate pain.    Yes [provider]  ondansetron (ZOFRAN) 4 MG tablet Take 1 tablet (4 mg total) by mouth every 6 (six) hours as needed for nausea. 09/08/18  Yes Lucretia RoersBridges, Lindsay C, MD  pantoprazole (PROTONIX) 40 MG tablet Take 1 tablet (40 mg total) by mouth 2 (two) times daily. Patient taking differently: Take 40 mg by mouth as needed.  09/08/18  Yes Lucretia RoersBridges, Lindsay C, MD  hyoscyamine (LEVSIN SL) 0.125 MG SL tablet Place 1 tablet (0.125 mg total) under the tongue every 4 (four) hours as needed. For abdominal discomfort 10/27/18   Gelene MinkBoone, Anna W, NP    Family History Family History  Problem Relation Age of Onset  . Diabetes Mother   . CAD Mother   . CAD Father   . Diabetes Father   . Throat cancer Father   . Colon cancer Neg Hx   .  Colon polyps Neg Hx        no first degree relatives with polyps  . Liver disease Neg Hx     Social History Social History   Tobacco Use  . Smoking status: Current Every Day Smoker    Packs/day: 1.00    Types: Cigarettes  . Smokeless tobacco: Never Used  Substance Use Topics  . Alcohol use: No  . Drug use: Yes    Types: Marijuana    Comment: last used a month ago per pt     Allergies   Patient has no known allergies.   Review of Systems Review of Systems  All systems reviewed and negative, other than as noted in HPI.  Physical Exam Updated Vital Signs BP (!) 144/91 (BP Location: Right Arm)   Pulse 100   Temp 98.6 F (37 C) (Oral)   Resp 18   SpO2 97%   Physical Exam Vitals signs and nursing note reviewed.  Constitutional:      General: She is not in acute distress.    Appearance: She is well-developed. She is obese.  HENT:     Head: Normocephalic and  atraumatic.  Eyes:     General:        Right eye: No discharge.        Left eye: No discharge.     Conjunctiva/sclera: Conjunctivae normal.  Neck:     Musculoskeletal: Neck supple.  Cardiovascular:     Rate and Rhythm: Normal rate and regular rhythm.     Heart sounds: Normal heart sounds. No murmur. No friction rub. No gallop.   Pulmonary:     Effort: Pulmonary effort is normal. No respiratory distress.     Breath sounds: Normal breath sounds.  Abdominal:     General: There is no distension.     Palpations: Abdomen is soft.     Tenderness: There is abdominal tenderness.     Comments: Tenderness to palpation in the right lower quadrant with no rebound or guarding.  No distention.  Musculoskeletal:        General: No tenderness.  Skin:    General: Skin is warm and dry.  Neurological:     Mental Status: She is alert.  Psychiatric:        Behavior: Behavior normal.        Thought Content: Thought content normal.      ED Treatments / Results  Labs (all labs ordered are listed, but only abnormal results are displayed) Labs Reviewed  COMPREHENSIVE METABOLIC PANEL - Abnormal; Notable for the following components:      Result Value   Glucose, Bld 106 (*)    Calcium 8.6 (*)    Total Bilirubin 0.2 (*)    All other components within normal limits  CBC - Abnormal; Notable for the following components:   nRBC 0.5 (*)    All other components within normal limits  LIPASE, BLOOD  URINALYSIS, ROUTINE W REFLEX MICROSCOPIC    EKG None  Radiology No results found.  Procedures Procedures (including critical care time)  Medications Ordered in ED Medications  sodium chloride flush (NS) 0.9 % injection 3 mL (has no administration in time range)  iohexol (OMNIPAQUE) 300 MG/ML solution 100 mL (has no administration in time range)  sodium chloride 0.9 % bolus 1,000 mL (1,000 mLs Intravenous New Bag/Given 10/28/18 0007)  morphine 4 MG/ML injection 6 mg (6 mg Intravenous Given 10/28/18  0008)     Initial Impression / Assessment and Plan /  ED Course  I have reviewed the triage vital signs and the nursing notes.  Pertinent labs & imaging results that were available during my care of the patient were reviewed by me and considered in my medical decision making (see chart for details).        48 year old female with abdominal pain.  She reports a 3-day history.  Recent evaluations for abdominal pain including in the gastroenterology office today.  Her provided history to me does not correlate exactly with the history provided in the office.  Labs concern for possible med seeking behavior.  Will be admitted to the doubt though that she does seem tender on exam.  Will CT.  If this is negative, plan to follow back up with GI.  I would not prescribe her any additional pain medication.  She can take Tylenol as needed.  Care signed to Dr. Manus Gunningancour.  Final Clinical Impressions(s) / ED Diagnoses   Final diagnoses:  Abdominal pain, unspecified abdominal location    ED Discharge Orders    None       Raeford RazorKohut, Shakti Fleer, MD 10/28/18 [phone]

## 2018-10-28 NOTE — Telephone Encounter (Signed)
SCHEDULED AND LETTER SENT  °

## 2018-10-29 ENCOUNTER — Ambulatory Visit (HOSPITAL_COMMUNITY)
Admission: RE | Admit: 2018-10-29 | Discharge: 2018-10-29 | Disposition: A | Discharge status: Home / Self Care | Payer: Medicaid Other | Source: Ambulatory Visit | Attending: Emergency Medicine | Admitting: Emergency Medicine

## 2018-10-29 ENCOUNTER — Telehealth (HOSPITAL_COMMUNITY): Payer: Self-pay | Admitting: Emergency Medicine

## 2018-10-29 ENCOUNTER — Other Ambulatory Visit: Payer: Self-pay

## 2018-10-29 DIAGNOSIS — R102 Pelvic and perineal pain: Secondary | ICD-10-CM | POA: Diagnosis not present

## 2018-10-29 MED ORDER — DIPHENOXYLATE-ATROPINE 2.5-0.025 MG PO TABS: 1.0000 | ORAL_TABLET | Freq: Four times a day (QID) | ORAL | 0 refills | Status: DC | PRN

## 2018-10-29 NOTE — ED Provider Notes (Signed)
Patient returns today for results of her pelvic ultrasound which was completed this morning.  Study was to rule out torsion, unfortunately the study was unable to visualize the ovaries secondary to extensive bowel gas.  Pain has been present for 3 to 4 days, therefore I doubt we are missing an emergent torsion.  Given extensive bowel gas, I suspect her pain she describes as sharp and constant may be from bowel gas trapping.  She endorses she is currently on Carafate, states the Bentyl that was prescribed by me at her visit for similar pain in July 5 was unhelpful.  She was seen by her GI specialist yesterday and upper endoscopy has been scheduled, but no new medicines were given.  Patient is very upset that we are not treating her pain.  She is also had loose stools several times daily, give her a short course of Lomotil to help with cramping and diarrhea.  Plan follow-up with GI as planned.   Evalee Jefferson, PA-C 10/29/18 1052    Maudie Flakes, MD 10/31/18 [phone]

## 2018-10-29 NOTE — Telephone Encounter (Signed)
Lmom, waiting on a return call.  

## 2018-11-01 NOTE — Telephone Encounter (Signed)
Lmom, waiting on a return call.  

## 2018-11-02 ENCOUNTER — Other Ambulatory Visit: Payer: Self-pay

## 2018-11-02 ENCOUNTER — Ambulatory Visit (HOSPITAL_COMMUNITY)
Admission: RE | Admit: 2018-11-02 | Discharge: 2018-11-02 | Disposition: A | Discharge status: Home / Self Care | Payer: Medicaid Other | Source: Ambulatory Visit | Attending: Family Medicine | Admitting: Family Medicine

## 2018-11-02 ENCOUNTER — Other Ambulatory Visit (HOSPITAL_COMMUNITY): Payer: Self-pay | Admitting: Family Medicine

## 2018-11-02 DIAGNOSIS — I110 Hypertensive heart disease with heart failure: Secondary | ICD-10-CM | POA: Diagnosis not present

## 2018-11-02 DIAGNOSIS — M19012 Primary osteoarthritis, left shoulder: Secondary | ICD-10-CM | POA: Diagnosis not present

## 2018-11-02 DIAGNOSIS — E1165 Type 2 diabetes mellitus with hyperglycemia: Secondary | ICD-10-CM | POA: Diagnosis not present

## 2018-11-02 DIAGNOSIS — M150 Primary generalized (osteo)arthritis: Secondary | ICD-10-CM | POA: Insufficient documentation

## 2018-11-02 DIAGNOSIS — M159 Polyosteoarthritis, unspecified: Secondary | ICD-10-CM

## 2018-11-02 DIAGNOSIS — D510 Vitamin B12 deficiency anemia due to intrinsic factor deficiency: Secondary | ICD-10-CM | POA: Diagnosis not present

## 2018-11-02 DIAGNOSIS — M5440 Lumbago with sciatica, unspecified side: Secondary | ICD-10-CM | POA: Diagnosis not present

## 2018-11-02 DIAGNOSIS — E559 Vitamin D deficiency, unspecified: Secondary | ICD-10-CM | POA: Diagnosis not present

## 2018-11-02 DIAGNOSIS — M545 Low back pain: Secondary | ICD-10-CM | POA: Diagnosis not present

## 2018-11-02 DIAGNOSIS — E7849 Other hyperlipidemia: Secondary | ICD-10-CM | POA: Diagnosis not present

## 2018-11-02 DIAGNOSIS — R5383 Other fatigue: Secondary | ICD-10-CM | POA: Diagnosis not present

## 2018-11-04 ENCOUNTER — Telehealth: Payer: Self-pay

## 2018-11-04 NOTE — Telephone Encounter (Signed)
Routing to EG who is covering for AB at this time.

## 2018-11-04 NOTE — Telephone Encounter (Signed)
Pt called with severe pain in her rt lower abdomen x 2 days. Pt states that her pain is a 9 with 10 being the highest. Pt was advised to go to the ED with the pain being that high. Pt is taking Zofran for the nausea, Protonix bid and Levsin with no help. Pt states that the pain is making her nauseated and the Zofran helps some. Pt went to the ED a few days ago and was given a CT. Pt doesn't want to go back to the ED because she feels she isn't getting the help she needs. Pt is having watery bowel movements 1-3 times daily.

## 2018-11-04 NOTE — Telephone Encounter (Signed)
Please tell the patient we are unable to treat severe pain like this over the phone. Her recent CT exam looked normal (after discussing it with the surgeon). Also, her U/S was normal. Labs were also normal recently.  Her U/S did show a lot of gas in her bowels and she can try Simethicone ("Gas-X") and see if this helps.   Has she taken the Levsin? Did that help at all?  Agree with referral to the ER for "severe" (9/10) pain.  NOT TO BE SHARED WITH THE PATIENT: Reviewed [place] controlled substance database due to previous provider concerns for drug seeking behavior. In 2020 she has had 4 different pain medication providers.

## 2018-11-04 NOTE — Telephone Encounter (Signed)
Lmom, waiting on a return call.  

## 2018-11-05 NOTE — Telephone Encounter (Signed)
Lmom, waiting on a return call.  

## 2018-11-08 DIAGNOSIS — G894 Chronic pain syndrome: Secondary | ICD-10-CM | POA: Diagnosis not present

## 2018-11-08 DIAGNOSIS — M2550 Pain in unspecified joint: Secondary | ICD-10-CM | POA: Diagnosis not present

## 2018-11-08 DIAGNOSIS — M545 Low back pain: Secondary | ICD-10-CM | POA: Diagnosis not present

## 2018-11-08 DIAGNOSIS — E7849 Other hyperlipidemia: Secondary | ICD-10-CM | POA: Diagnosis not present

## 2018-11-08 NOTE — Telephone Encounter (Signed)
Refer to note dated 10/28/18.

## 2018-11-09 NOTE — Telephone Encounter (Signed)
Called patient. She states she has tried "everything". She states she does not feel like it is the gas causing the pain. She states she doesn't understanding where the pain is coming from. The pain is not daily but when she does have it, it feels like the pain is "like someone is stabbing her in the stomach". Reports she is not wanting something for pain, she just wants to see what is causing this.

## 2018-11-15 NOTE — Telephone Encounter (Signed)
Lmom, waiting on a return call.  

## 2018-11-15 NOTE — Telephone Encounter (Signed)
If having watery stools, check Cdiff, culture, Giardia.  Does the pain correlate with BMs and relieved thereafter?  CT completed July 29th in ED. CT with 14 mm hypoenhancing lesion along anterior/subcapsular aspect liver, possibly resolving fluid collection, unable to rule out abscess. Appendix is normal. I will ask Dr. Constance Haw about most recent CT. Cholecystectomy completed in June.

## 2018-11-19 NOTE — Telephone Encounter (Signed)
Spoke with pt, she has pain no matter what per pt. The pain the there before, during and after bowel movements. Pt is still having watery or loose stool. Stool isn't formed. Pt is refusing stool studies. Pt states that she is tired of having stool test done.

## 2018-11-19 NOTE — Telephone Encounter (Signed)
I can't offer any further recommendations until stool studies are completed.

## 2018-11-22 NOTE — Telephone Encounter (Signed)
Noted. Pt is scheduled for EGD with [facility] on 12/02/2018 and is aware of AB's recommendations of stool studies.

## 2018-11-30 ENCOUNTER — Encounter (HOSPITAL_COMMUNITY)
Admission: RE | Admit: 2018-11-30 | Discharge: 2018-11-30 | Disposition: A | Discharge status: Home / Self Care | Payer: Medicaid Other | Source: Ambulatory Visit | Attending: Internal Medicine | Admitting: Internal Medicine

## 2018-11-30 ENCOUNTER — Other Ambulatory Visit: Payer: Self-pay

## 2018-11-30 ENCOUNTER — Other Ambulatory Visit (HOSPITAL_COMMUNITY)
Admission: RE | Admit: 2018-11-30 | Discharge: 2018-11-30 | Disposition: A | Discharge status: Home / Self Care | Payer: Medicaid Other | Source: Ambulatory Visit | Attending: Internal Medicine | Admitting: Internal Medicine

## 2018-12-01 ENCOUNTER — Telehealth: Payer: Self-pay

## 2018-12-01 NOTE — Telephone Encounter (Addendum)
Pre-op nurse has been unable to contact pt for pre-op. Procedure cancelled for tomorrow d/t no pre-op appt and no show for COVID test. Endo scheduler informed to cancel procedure.  FYI to AB.

## 2018-12-01 NOTE — Telephone Encounter (Signed)
Leslie Lowery at [facility] called office, pt was no show for COVID test yesterday. EGD w/Propofol for tomorrow will need to be rescheduled d/t COVID testing site is closed today.  Tried to call pt, no answer, LMOVM for return call.

## 2018-12-02 ENCOUNTER — Ambulatory Visit (HOSPITAL_COMMUNITY): Admission: RE | Admit: 2018-12-02 | Payer: Medicaid Other | Source: Home / Self Care | Admitting: Internal Medicine

## 2018-12-02 ENCOUNTER — Encounter (HOSPITAL_COMMUNITY): Admission: RE | Payer: Self-pay | Source: Home / Self Care

## 2018-12-02 SURGERY — ESOPHAGOGASTRODUODENOSCOPY (EGD) WITH PROPOFOL
Anesthesia: Monitor Anesthesia Care

## 2018-12-07 DIAGNOSIS — E7849 Other hyperlipidemia: Secondary | ICD-10-CM | POA: Diagnosis not present

## 2018-12-07 DIAGNOSIS — M545 Low back pain: Secondary | ICD-10-CM | POA: Diagnosis not present

## 2019-01-05 DIAGNOSIS — G894 Chronic pain syndrome: Secondary | ICD-10-CM | POA: Diagnosis not present

## 2019-01-05 DIAGNOSIS — E662 Morbid (severe) obesity with alveolar hypoventilation: Secondary | ICD-10-CM | POA: Diagnosis not present

## 2019-01-05 DIAGNOSIS — M545 Low back pain: Secondary | ICD-10-CM | POA: Diagnosis not present

## 2019-01-20 ENCOUNTER — Other Ambulatory Visit: Payer: Self-pay | Admitting: Family Medicine

## 2019-01-20 ENCOUNTER — Other Ambulatory Visit (HOSPITAL_COMMUNITY): Payer: Self-pay | Admitting: Family Medicine

## 2019-01-20 DIAGNOSIS — M5440 Lumbago with sciatica, unspecified side: Secondary | ICD-10-CM

## 2019-01-26 ENCOUNTER — Encounter: Payer: Self-pay | Admitting: Gastroenterology

## 2019-01-26 ENCOUNTER — Ambulatory Visit: Payer: Medicaid Other | Admitting: Gastroenterology

## 2019-01-26 ENCOUNTER — Ambulatory Visit (HOSPITAL_COMMUNITY): Admission: RE | Admit: 2019-01-26 | Payer: Medicaid Other | Source: Ambulatory Visit

## 2019-01-26 ENCOUNTER — Encounter (HOSPITAL_COMMUNITY): Payer: Self-pay

## 2019-01-26 ENCOUNTER — Telehealth: Payer: Self-pay | Admitting: Gastroenterology

## 2019-01-26 NOTE — Telephone Encounter (Signed)
PATIENT WAS A NO SHOW AND LETTER SENT  °

## 2019-02-03 DIAGNOSIS — G894 Chronic pain syndrome: Secondary | ICD-10-CM | POA: Diagnosis not present

## 2019-02-03 DIAGNOSIS — M545 Low back pain: Secondary | ICD-10-CM | POA: Diagnosis not present

## 2019-02-08 ENCOUNTER — Other Ambulatory Visit: Payer: Self-pay

## 2019-02-08 ENCOUNTER — Ambulatory Visit (HOSPITAL_COMMUNITY)
Admission: RE | Admit: 2019-02-08 | Discharge: 2019-02-08 | Disposition: A | Discharge status: Home / Self Care | Payer: Medicaid Other | Source: Ambulatory Visit | Attending: Family Medicine | Admitting: Family Medicine

## 2019-02-08 DIAGNOSIS — M48061 Spinal stenosis, lumbar region without neurogenic claudication: Secondary | ICD-10-CM | POA: Diagnosis not present

## 2019-02-08 DIAGNOSIS — M5440 Lumbago with sciatica, unspecified side: Secondary | ICD-10-CM | POA: Insufficient documentation

## 2019-03-02 ENCOUNTER — Ambulatory Visit
Admission: EM | Admit: 2019-03-02 | Discharge: 2019-03-02 | Disposition: A | Discharge status: Home / Self Care | Payer: Medicaid Other | Attending: Emergency Medicine | Admitting: Emergency Medicine

## 2019-03-02 ENCOUNTER — Other Ambulatory Visit: Payer: Self-pay

## 2019-03-02 DIAGNOSIS — Z20822 Contact with and (suspected) exposure to covid-19: Secondary | ICD-10-CM

## 2019-03-02 DIAGNOSIS — R112 Nausea with vomiting, unspecified: Secondary | ICD-10-CM

## 2019-03-02 DIAGNOSIS — G8929 Other chronic pain: Secondary | ICD-10-CM

## 2019-03-02 DIAGNOSIS — R197 Diarrhea, unspecified: Secondary | ICD-10-CM | POA: Diagnosis not present

## 2019-03-02 DIAGNOSIS — Z20828 Contact with and (suspected) exposure to other viral communicable diseases: Secondary | ICD-10-CM

## 2019-03-02 DIAGNOSIS — M25561 Pain in right knee: Secondary | ICD-10-CM

## 2019-03-02 MED ORDER — PREDNISONE 20 MG PO TABS: 20.0000 mg | ORAL_TABLET | Freq: Two times a day (BID) | ORAL | 0 refills | Status: AC

## 2019-03-02 MED ORDER — ALBUTEROL SULFATE HFA 108 (90 BASE) MCG/ACT IN AERS: 2.0000 | INHALATION_SPRAY | Freq: Once | RESPIRATORY_TRACT | Status: DC

## 2019-03-02 NOTE — ED Triage Notes (Signed)
Pt presents with c/o right knee pain and swelling that she has pain in for a while, pt states that pain comes and goes and that it has been worse past few days . Pt also needs covid test for work after calling out yesterday for nausea

## 2019-03-02 NOTE — Discharge Instructions (Signed)
RT knee pain: Continue conservative management of rest, ice, and gentle stretches Knee brace placed Prednisone prescribed.  Take as directed and to completion Follow up with PCP or orthopedist for further evaluation and management of chronic knee pain Return or go to the ER if you have any new or worsening symptoms (fever, chills, redness, bruising, deformity, worsening swelling, symptoms persists despite treatment, etc...)   Nausea, vomiting, diarrhea/ COVID test: COVID testing ordered.  It will take between 5-7 days for test results.  Someone will contact you regarding abnormal results.    In the meantime: You should remain isolated in your home for 10 days from symptom onset AND greater than 72 hours after symptoms resolution (absence of fever without the use of fever-reducing medication and improvement in respiratory symptoms), whichever is longer Get plenty of rest and push fluids Albuterol inhaler given in office.  Use as needed for shortness of breath and/or wheezing Follow up with PCP in 1-2 days for recheck  Call or go to the ED if you have any new or worsening symptoms such as fever, worsening cough, shortness of breath, chest tightness, chest pain, turning blue, changes in mental status, etc..Marland Kitchen
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
## 2019-03-04 DIAGNOSIS — M545 Low back pain: Secondary | ICD-10-CM | POA: Diagnosis not present

## 2019-03-04 DIAGNOSIS — M79662 Pain in left lower leg: Secondary | ICD-10-CM | POA: Diagnosis not present

## 2019-03-04 DIAGNOSIS — M79661 Pain in right lower leg: Secondary | ICD-10-CM | POA: Diagnosis not present

## 2019-03-04 DIAGNOSIS — G894 Chronic pain syndrome: Secondary | ICD-10-CM | POA: Diagnosis not present

## 2019-03-04 LAB — NOVEL CORONAVIRUS, NAA: SARS-CoV-2, NAA: NOT DETECTED

## 2019-03-18 DIAGNOSIS — R110 Nausea: Secondary | ICD-10-CM | POA: Diagnosis not present

## 2019-03-18 DIAGNOSIS — R000 Tachycardia, unspecified: Secondary | ICD-10-CM | POA: Diagnosis not present

## 2019-03-18 DIAGNOSIS — R52 Pain, unspecified: Secondary | ICD-10-CM | POA: Diagnosis not present

## 2019-03-18 DIAGNOSIS — R1031 Right lower quadrant pain: Secondary | ICD-10-CM | POA: Diagnosis not present

## 2019-03-18 DIAGNOSIS — R1084 Generalized abdominal pain: Secondary | ICD-10-CM | POA: Diagnosis not present

## 2019-03-18 DIAGNOSIS — I499 Cardiac arrhythmia, unspecified: Secondary | ICD-10-CM | POA: Diagnosis not present

## 2019-03-21 ENCOUNTER — Observation Stay (HOSPITAL_COMMUNITY): Payer: Medicaid Other

## 2019-03-21 ENCOUNTER — Emergency Department (HOSPITAL_COMMUNITY): Payer: Medicaid Other

## 2019-03-21 ENCOUNTER — Encounter (HOSPITAL_COMMUNITY): Payer: Self-pay | Admitting: Emergency Medicine

## 2019-03-21 ENCOUNTER — Other Ambulatory Visit: Payer: Self-pay

## 2019-03-21 ENCOUNTER — Observation Stay (HOSPITAL_BASED_OUTPATIENT_CLINIC_OR_DEPARTMENT_OTHER): Payer: Medicaid Other

## 2019-03-21 ENCOUNTER — Observation Stay (HOSPITAL_COMMUNITY)
Admission: EM | Admit: 2019-03-21 | Discharge: 2019-03-22 | Disposition: A | Discharge status: Home / Self Care | Payer: Medicaid Other | Attending: Internal Medicine | Admitting: Internal Medicine

## 2019-03-21 ENCOUNTER — Observation Stay (HOSPITAL_COMMUNITY)
Admit: 2019-03-21 | Discharge: 2019-03-21 | Disposition: A | Discharge status: Home / Self Care | Payer: Medicaid Other | Attending: Internal Medicine | Admitting: Internal Medicine

## 2019-03-21 DIAGNOSIS — F329 Major depressive disorder, single episode, unspecified: Secondary | ICD-10-CM | POA: Insufficient documentation

## 2019-03-21 DIAGNOSIS — Z720 Tobacco use: Secondary | ICD-10-CM | POA: Diagnosis not present

## 2019-03-21 DIAGNOSIS — I10 Essential (primary) hypertension: Secondary | ICD-10-CM | POA: Diagnosis not present

## 2019-03-21 DIAGNOSIS — G92 Toxic encephalopathy: Secondary | ICD-10-CM | POA: Diagnosis not present

## 2019-03-21 DIAGNOSIS — G930 Cerebral cysts: Secondary | ICD-10-CM | POA: Insufficient documentation

## 2019-03-21 DIAGNOSIS — Z79899 Other long term (current) drug therapy: Secondary | ICD-10-CM | POA: Insufficient documentation

## 2019-03-21 DIAGNOSIS — R1011 Right upper quadrant pain: Secondary | ICD-10-CM | POA: Insufficient documentation

## 2019-03-21 DIAGNOSIS — M5136 Other intervertebral disc degeneration, lumbar region: Secondary | ICD-10-CM | POA: Diagnosis not present

## 2019-03-21 DIAGNOSIS — R404 Transient alteration of awareness: Secondary | ICD-10-CM | POA: Diagnosis not present

## 2019-03-21 DIAGNOSIS — K429 Umbilical hernia without obstruction or gangrene: Secondary | ICD-10-CM | POA: Diagnosis not present

## 2019-03-21 DIAGNOSIS — Z20828 Contact with and (suspected) exposure to other viral communicable diseases: Secondary | ICD-10-CM | POA: Insufficient documentation

## 2019-03-21 DIAGNOSIS — R4182 Altered mental status, unspecified: Secondary | ICD-10-CM | POA: Diagnosis not present

## 2019-03-21 DIAGNOSIS — G894 Chronic pain syndrome: Secondary | ICD-10-CM

## 2019-03-21 DIAGNOSIS — E785 Hyperlipidemia, unspecified: Secondary | ICD-10-CM | POA: Insufficient documentation

## 2019-03-21 DIAGNOSIS — R531 Weakness: Secondary | ICD-10-CM | POA: Diagnosis not present

## 2019-03-21 DIAGNOSIS — I700 Atherosclerosis of aorta: Secondary | ICD-10-CM | POA: Diagnosis not present

## 2019-03-21 DIAGNOSIS — R4781 Slurred speech: Secondary | ICD-10-CM | POA: Diagnosis not present

## 2019-03-21 DIAGNOSIS — M47816 Spondylosis without myelopathy or radiculopathy, lumbar region: Secondary | ICD-10-CM | POA: Insufficient documentation

## 2019-03-21 DIAGNOSIS — F1910 Other psychoactive substance abuse, uncomplicated: Secondary | ICD-10-CM | POA: Insufficient documentation

## 2019-03-21 DIAGNOSIS — R4020 Unspecified coma: Secondary | ICD-10-CM | POA: Diagnosis not present

## 2019-03-21 DIAGNOSIS — G459 Transient cerebral ischemic attack, unspecified: Secondary | ICD-10-CM

## 2019-03-21 DIAGNOSIS — R29818 Other symptoms and signs involving the nervous system: Secondary | ICD-10-CM | POA: Diagnosis not present

## 2019-03-21 DIAGNOSIS — F17210 Nicotine dependence, cigarettes, uncomplicated: Secondary | ICD-10-CM | POA: Diagnosis not present

## 2019-03-21 DIAGNOSIS — K219 Gastro-esophageal reflux disease without esophagitis: Secondary | ICD-10-CM | POA: Insufficient documentation

## 2019-03-21 DIAGNOSIS — Z6841 Body Mass Index (BMI) 40.0 and over, adult: Secondary | ICD-10-CM | POA: Diagnosis not present

## 2019-03-21 DIAGNOSIS — G8191 Hemiplegia, unspecified affecting right dominant side: Secondary | ICD-10-CM

## 2019-03-21 DIAGNOSIS — N200 Calculus of kidney: Secondary | ICD-10-CM | POA: Insufficient documentation

## 2019-03-21 DIAGNOSIS — Z8249 Family history of ischemic heart disease and other diseases of the circulatory system: Secondary | ICD-10-CM | POA: Diagnosis not present

## 2019-03-21 DIAGNOSIS — K7689 Other specified diseases of liver: Secondary | ICD-10-CM | POA: Diagnosis not present

## 2019-03-21 DIAGNOSIS — F1210 Cannabis abuse, uncomplicated: Secondary | ICD-10-CM | POA: Insufficient documentation

## 2019-03-21 DIAGNOSIS — M549 Dorsalgia, unspecified: Secondary | ICD-10-CM | POA: Insufficient documentation

## 2019-03-21 DIAGNOSIS — R0689 Other abnormalities of breathing: Secondary | ICD-10-CM | POA: Diagnosis not present

## 2019-03-21 DIAGNOSIS — R200 Anesthesia of skin: Secondary | ICD-10-CM | POA: Diagnosis not present

## 2019-03-21 DIAGNOSIS — F419 Anxiety disorder, unspecified: Secondary | ICD-10-CM | POA: Diagnosis not present

## 2019-03-21 DIAGNOSIS — G9341 Metabolic encephalopathy: Secondary | ICD-10-CM

## 2019-03-21 DIAGNOSIS — R52 Pain, unspecified: Secondary | ICD-10-CM | POA: Diagnosis not present

## 2019-03-21 LAB — CBC
HCT: 38.7 % (ref 36.0–46.0)
Hemoglobin: 12.3 g/dL (ref 12.0–15.0)
MCH: 27.5 pg (ref 26.0–34.0)
MCHC: 31.8 g/dL (ref 30.0–36.0)
MCV: 86.4 fL (ref 80.0–100.0)
Platelets: 240 10*3/uL (ref 150–400)
RBC: 4.48 MIL/uL (ref 3.87–5.11)
RDW: 14 % (ref 11.5–15.5)
WBC: 8.9 10*3/uL (ref 4.0–10.5)
nRBC: 0 % (ref 0.0–0.2)

## 2019-03-21 LAB — DIFFERENTIAL
Abs Immature Granulocytes: 0.02 10*3/uL (ref 0.00–0.07)
Basophils Absolute: 0 10*3/uL (ref 0.0–0.1)
Basophils Relative: 0 %
Eosinophils Absolute: 0.1 10*3/uL (ref 0.0–0.5)
Eosinophils Relative: 1 %
Immature Granulocytes: 0 %
Lymphocytes Relative: 18 %
Lymphs Abs: 1.6 10*3/uL (ref 0.7–4.0)
Monocytes Absolute: 0.4 10*3/uL (ref 0.1–1.0)
Monocytes Relative: 4 %
Neutro Abs: 6.8 10*3/uL (ref 1.7–7.7)
Neutrophils Relative %: 77 %

## 2019-03-21 LAB — URINALYSIS, ROUTINE W REFLEX MICROSCOPIC
Bilirubin Urine: NEGATIVE
Glucose, UA: NEGATIVE mg/dL
Hgb urine dipstick: NEGATIVE
Ketones, ur: NEGATIVE mg/dL
Leukocytes,Ua: NEGATIVE
Nitrite: NEGATIVE
Protein, ur: NEGATIVE mg/dL
Specific Gravity, Urine: 1.015 (ref 1.005–1.030)
pH: 6 (ref 5.0–8.0)

## 2019-03-21 LAB — ECHOCARDIOGRAM COMPLETE
Height: 64 in
Weight: 3844.82 oz

## 2019-03-21 LAB — COMPREHENSIVE METABOLIC PANEL
ALT: 18 U/L (ref 0–44)
AST: 14 U/L — ABNORMAL LOW (ref 15–41)
Albumin: 3.9 g/dL (ref 3.5–5.0)
Alkaline Phosphatase: 72 U/L (ref 38–126)
Anion gap: 9 (ref 5–15)
BUN: 14 mg/dL (ref 6–20)
CO2: 26 mmol/L (ref 22–32)
Calcium: 9 mg/dL (ref 8.9–10.3)
Chloride: 105 mmol/L (ref 98–111)
Creatinine, Ser: 0.81 mg/dL (ref 0.44–1.00)
GFR calc Af Amer: >60 mL/min (ref >60)
GFR calc non Af Amer: >60 mL/min (ref >60)
Glucose, Bld: 114 mg/dL — ABNORMAL HIGH (ref 70–99)
Potassium: 3.5 mmol/L (ref 3.5–5.1)
Sodium: 140 mmol/L (ref 135–145)
Total Bilirubin: 0.6 mg/dL (ref 0.3–1.2)
Total Protein: 6.9 g/dL (ref 6.5–8.1)

## 2019-03-21 LAB — LIPID PANEL
Cholesterol: 204 mg/dL — ABNORMAL HIGH (ref 0–200)
HDL: 25 mg/dL — ABNORMAL LOW (ref >40)
LDL Cholesterol: 142 mg/dL — ABNORMAL HIGH (ref 0–99)
Total CHOL/HDL Ratio: 8.2 RATIO
Triglycerides: 183 mg/dL — ABNORMAL HIGH (ref <150)
VLDL: 37 mg/dL (ref 0–40)

## 2019-03-21 LAB — RAPID URINE DRUG SCREEN, HOSP PERFORMED
Amphetamines: POSITIVE — AB
Barbiturates: NOT DETECTED
Benzodiazepines: NOT DETECTED
Cocaine: NOT DETECTED
Opiates: POSITIVE — AB
Tetrahydrocannabinol: POSITIVE — AB

## 2019-03-21 LAB — SARS CORONAVIRUS 2 (TAT 6-24 HRS): SARS Coronavirus 2: NEGATIVE

## 2019-03-21 LAB — TSH: TSH: 1.654 u[IU]/mL (ref 0.350–4.500)

## 2019-03-21 LAB — VITAMIN B12: Vitamin B-12: 105 pg/mL — ABNORMAL LOW (ref 180–914)

## 2019-03-21 LAB — HEMOGLOBIN A1C
Hgb A1c MFr Bld: 5.6 % (ref 4.8–5.6)
Mean Plasma Glucose: 114.02 mg/dL

## 2019-03-21 LAB — PROTIME-INR
INR: 0.9 (ref 0.8–1.2)
Prothrombin Time: 12.2 seconds (ref 11.4–15.2)

## 2019-03-21 LAB — APTT: aPTT: 26 seconds (ref 24–36)

## 2019-03-21 LAB — HIV ANTIBODY (ROUTINE TESTING W REFLEX): HIV Screen 4th Generation wRfx: NONREACTIVE

## 2019-03-21 LAB — ETHANOL: Alcohol, Ethyl (B): <10 mg/dL (ref <10)

## 2019-03-21 LAB — RPR: RPR Ser Ql: NONREACTIVE

## 2019-03-21 LAB — SEDIMENTATION RATE: Sed Rate: 8 mm/hr (ref 0–22)

## 2019-03-21 LAB — FOLATE: Folate: 7.4 ng/mL (ref >5.9)

## 2019-03-21 LAB — AMMONIA: Ammonia: 21 umol/L (ref 9–35)

## 2019-03-21 MED ORDER — PANTOPRAZOLE SODIUM 40 MG IV SOLR
40.0000 mg | Freq: Two times a day (BID) | INTRAVENOUS | Status: DC
  Administered 2019-03-21 – 2019-03-22 (×2): 40 mg via INTRAVENOUS
  Filled 2019-03-21 (×3): qty 40

## 2019-03-21 MED ORDER — ASPIRIN 325 MG PO TABS
325.0000 mg | ORAL_TABLET | Freq: Every day | ORAL | Status: DC
  Administered 2019-03-21 – 2019-03-22 (×2): 325 mg via ORAL
  Filled 2019-03-21 (×2): qty 1

## 2019-03-21 MED ORDER — ACETAMINOPHEN 325 MG PO TABS: 650.0000 mg | ORAL_TABLET | ORAL | Status: DC | PRN

## 2019-03-21 MED ORDER — NALOXONE HCL 0.4 MG/ML IJ SOLN
0.4000 mg | Freq: Once | INTRAMUSCULAR | Status: AC
  Administered 2019-03-21: 0.4 mg via INTRAVENOUS
  Filled 2019-03-21: qty 1

## 2019-03-21 MED ORDER — ACETAMINOPHEN 650 MG RE SUPP: 650.0000 mg | RECTAL | Status: DC | PRN

## 2019-03-21 MED ORDER — STROKE: EARLY STAGES OF RECOVERY BOOK
Freq: Once | Status: DC
  Filled 2019-03-21: qty 1

## 2019-03-21 MED ORDER — ASPIRIN 300 MG RE SUPP
300.0000 mg | Freq: Every day | RECTAL | Status: DC
  Filled 2019-03-21 (×2): qty 1

## 2019-03-21 MED ORDER — LORAZEPAM 2 MG/ML IJ SOLN
INTRAMUSCULAR | Status: AC
  Filled 2019-03-21: qty 1

## 2019-03-21 MED ORDER — FLUMAZENIL 0.5 MG/5ML IV SOLN
0.2000 mg | Freq: Once | INTRAVENOUS | Status: AC
  Administered 2019-03-21: 0.2 mg via INTRAVENOUS
  Filled 2019-03-21: qty 5

## 2019-03-21 MED ORDER — ATORVASTATIN CALCIUM 40 MG PO TABS: 80.0000 mg | ORAL_TABLET | Freq: Every day | ORAL | Status: DC

## 2019-03-21 MED ORDER — NALOXONE HCL 0.4 MG/ML IJ SOLN
0.4000 mg | Freq: Once | INTRAMUSCULAR | Status: DC
  Filled 2019-03-21: qty 1

## 2019-03-21 MED ORDER — TRAMADOL HCL 50 MG PO TABS
50.0000 mg | ORAL_TABLET | Freq: Four times a day (QID) | ORAL | Status: AC | PRN
  Administered 2019-03-21 (×2): 50 mg via ORAL
  Filled 2019-03-21 (×2): qty 1

## 2019-03-21 MED ORDER — PANTOPRAZOLE SODIUM 40 MG PO TBEC: 40.0000 mg | DELAYED_RELEASE_TABLET | Freq: Every day | ORAL | Status: DC

## 2019-03-21 MED ORDER — ACETAMINOPHEN 160 MG/5ML PO SOLN: 650.0000 mg | ORAL | Status: DC | PRN

## 2019-03-21 MED ORDER — OXYCODONE HCL 5 MG PO TABS
5.0000 mg | ORAL_TABLET | Freq: Once | ORAL | Status: AC
  Administered 2019-03-21: 5 mg via ORAL
  Filled 2019-03-21: qty 1

## 2019-03-21 MED ORDER — KETOROLAC TROMETHAMINE 30 MG/ML IJ SOLN
30.0000 mg | Freq: Once | INTRAMUSCULAR | Status: AC
  Administered 2019-03-21: 30 mg via INTRAVENOUS
  Filled 2019-03-21: qty 1

## 2019-03-21 MED ORDER — POTASSIUM CHLORIDE IN NACL 20-0.9 MEQ/L-% IV SOLN
INTRAVENOUS | Status: DC
  Filled 2019-03-21: qty 1000

## 2019-03-21 MED ORDER — IOHEXOL 9 MG/ML PO SOLN: 500.0000 mL | ORAL | Status: AC

## 2019-03-21 MED ORDER — OXYCODONE HCL 5 MG PO TABS
5.0000 mg | ORAL_TABLET | Freq: Four times a day (QID) | ORAL | Status: DC | PRN
  Administered 2019-03-21 – 2019-03-22 (×4): 5 mg via ORAL
  Filled 2019-03-21 (×4): qty 1

## 2019-03-21 NOTE — Progress Notes (Signed)
PROGRESS NOTE  Leslie Lowery RUE:[id number] DOB: 08/16/1970 DOA: 03/21/2019 PCP: Patient, No Pcp Per  Brief History:  48 year old female with history of anxiety/depression, hypertension, tobacco abuse, chronic cholecystitis status post laparoscopic cholecystectomy 09/07/2018, NASH presenting when she was found unresponsive by her family at her home.  The patient is a difficult historian as she is quite tangential.  Nevertheless, EMS gave the patient Narcan x2 after which the patient was more alert.  However, the patient was noted to have slurred speech and right upper extremity weakness.  According to the patient, she stated that on the evening of 03/20/2019, she was sitting on her couch and reaching for cigarettes and having difficulty using her right arm.  She had an associated headache with this episode.  She stated after that she lost consciousness and did not remember anything else except waking up an ambulance.  She had denied any fevers, chills, chest pain, vomiting, diarrhea, dysuria, hematochezia, melena.  She states that she has had abdominal pain since her cholecystectomy, but states that it has been worse in the past week mostly isolated in the right upper quadrant.  She stated that she went to an outside hospital 2 days prior to this admission.  Apparently a CT scan was done, but she does not know the results.  She was discharged home from the emergency department at the outside hospital.  She states that she uses chronic oxycodone and Adderall.  Her oxycodone is for her chronic back pain.  She states that for the past week she has been using over-the-counter ibuprofen on a daily basis taking approximately 6 tablets daily.  In the emergency department, the patient was initially lethargic, but with stimulation she would wake up.  There was some concern initially of some right upper extremity weakness and slurred speech as the patient was mumbling.  Initial code stroke was called.  CT  of the brain was negative.  However upon evaluation by EDP, the patient was awakened and follow commands and moves all 4 extremities.  The patient denies taking any extra oxycodone or other illicit substances.   The patient was afebrile hemodynamically stable saturating 96,008% on room air.  BMP, LFTs, and CBC were essentially unremarkable.  Assessment/Plan: Right upper extremity weakness/hemiparesis/slurred speech -Suspect this is secondary to opioid overuse/other illicit substance -Concern for seizure and Todd's paralysis -CT brain negative -MRI brain -EEG -Urine drug screen positive for amphetamine, opiates, THC -Continue aspirin for now -Personally reviewed EKG--sinus rhythm, RSR prime, nonspecific T wave change  Acute metabolic/toxic encephalopathy -Likely secondary to opioid overuse and possible illicit substance -Urinalysis negative for pyuria -Patient now is more alert and back to baseline at the time of my evaluation -Consideration given to seizure with Todd's paralysis -Further work-up of patient does not continue to improve -Personally reviewed EKG--sinus rhythm, RSR prime, nonspecific T wave change  Chronic abdominal pain -CT abdomen pelvis -Patient may have a degree of peptic ulcer disease related to NSAID use -Clear liquid diet for now -start PPI -start IVF with KCl  Elevated blood pressure -Patient is not on any agents in the outpatient setting  Hyperlipidemia -Continue statin  Chronic pain syndrome -I have reviewed PMP Aware--pt receives monthly oxycodone 5 mg #120 and dextroamphetamine #30  Tobacco abuse -Tobacco cessation discussed I have discussed tobacco cessation with the patient.  I have counseled the patient regarding the negative impacts of continued tobacco use including but not limited to lung cancer, COPD,  and cardiovascular disease.  I have discussed alternatives to tobacco and modalities that may help facilitate tobacco cessation including but not  limited to biofeedback, hypnosis, and medications.  Total time spent with tobacco counseling was 4 minutes.   THC abuse -cessation discussed   Total time spent 35 minutes.  Greater than 50% spent face to face counseling and coordinating care.     Disposition Plan:   Home in 1-2 days  Family Communication:   Family at bedside  Consultants:  neurology  Code Status:  FULL   DVT Prophylaxis:  [place] Lovenox   Procedures: As Listed in Progress Note Above  Antibiotics: None       Subjective: Patient continues to complain of right upper quadrant abdominal pain patient has some nausea.  She denies any visual disturbance, chest pain, shortness breath, diarrhea, hematochezia, melena.  Objective: Vitals:   03/21/19 0530 03/21/19 0600 03/21/19 0630 03/21/19 0700  BP: (!) 143/96 (!) 146/86 (!) 139/93 116/83  Pulse: 81 79 75 82  Resp: (!) 22 15 19 15   SpO2: 97% 96% 97% 97%  Weight:      Height:       No intake or output data in the 24 hours ending 03/21/19 0735 Weight change:  Exam:   General:  Pt is alert, follows commands appropriately, not in acute distress  HEENT: No icterus, No thrush, No neck mass, [place]/AT  Cardiovascular: RRR, S1/S2, no rubs, no gallops  Respiratory: CTA bilaterally, no wheezing, no crackles, no rhonchi  Abdomen: Soft/+BS, non tender, non distended, no guarding  Extremities: No edema, No lymphangitis, No petechiae, No rashes, no synovitis   Data Reviewed: I have personally reviewed following labs and imaging studies Basic Metabolic Panel: Recent Labs  Lab 03/21/19 0302  NA 140  K 3.5  CL 105  CO2 26  GLUCOSE 114*  BUN 14  CREATININE 0.81  CALCIUM 9.0   Liver Function Tests: Recent Labs  Lab 03/21/19 0302  AST 14*  ALT 18  ALKPHOS 72  BILITOT 0.6  PROT 6.9  ALBUMIN 3.9   No results for input(s): LIPASE, AMYLASE in the last 168 hours. No results for input(s): AMMONIA in the last 168 hours. Coagulation Profile: Recent Labs   Lab 03/21/19 0302  INR 0.9   CBC: Recent Labs  Lab 03/21/19 0302  WBC 8.9  NEUTROABS 6.8  HGB 12.3  HCT 38.7  MCV 86.4  PLT 240   Cardiac Enzymes: No results for input(s): CKTOTAL, CKMB, CKMBINDEX, TROPONINI in the last 168 hours. BNP: Invalid input(s): POCBNP CBG: No results for input(s): GLUCAP in the last 168 hours. HbA1C: No results for input(s): HGBA1C in the last 72 hours. Urine analysis:    Component Value Date/Time   COLORURINE YELLOW 03/21/2019 0240   APPEARANCEUR CLEAR 03/21/2019 0240   LABSPEC 1.015 03/21/2019 0240   PHURINE 6.0 03/21/2019 0240   GLUCOSEU NEGATIVE 03/21/2019 0240   HGBUR NEGATIVE 03/21/2019 0240   BILIRUBINUR NEGATIVE 03/21/2019 0240   KETONESUR NEGATIVE 03/21/2019 0240   PROTEINUR NEGATIVE 03/21/2019 0240   UROBILINOGEN 0.2 04/08/2008 1128   NITRITE NEGATIVE 03/21/2019 0240   LEUKOCYTESUR NEGATIVE 03/21/2019 0240   Sepsis Labs: @LABRCNTIP (procalcitonin:4,lacticidven:4) )No results found for this or any previous visit (from the past 240 hour(s)).   Scheduled Meds: .  stroke: mapping our early stages of recovery book   Does not apply Once  . aspirin  300 mg Rectal Daily   Or  . aspirin  325 mg Oral Daily  . atorvastatin  80 mg Oral q1800  . pantoprazole  40 mg Oral Daily   Continuous Infusions:  Procedures/Studies: DG Chest 2 View  Result Date: 03/21/2019 CLINICAL DATA:  Unresponsive with slurred speech EXAM: CHEST - 2 VIEW COMPARISON:  09/16/2018 FINDINGS: Artifact from EKG leads. There is no edema, consolidation, effusion, or pneumothorax. Normal heart size and mediastinal contours. IMPRESSION: Negative chest. Electronically Signed   By: Marnee Spring M.D.   On: 03/21/2019 06:29   CT HEAD CODE STROKE WO CONTRAST  Result Date: 03/21/2019 CLINICAL DATA:  Code stroke. EXAM: CT HEAD WITHOUT CONTRAST TECHNIQUE: Contiguous axial images were obtained from the base of the skull through the vertex without intravenous contrast.  COMPARISON:  None. FINDINGS: Brain: There is no mass, hemorrhage or extra-axial collection. The size and configuration of the ventricles and extra-axial CSF spaces are normal. The brain parenchyma is normal, without evidence of acute or chronic infarction. Vascular: No abnormal hyperdensity of the major intracranial arteries or dural venous sinuses. No intracranial atherosclerosis. Skull: The visualized skull base, calvarium and extracranial soft tissues are normal. Sinuses/Orbits: No fluid levels or advanced mucosal thickening of the visualized paranasal sinuses. No mastoid or middle ear effusion. The orbits are normal. ASPECTS [facility] Stroke Program Early CT Score) - Ganglionic level infarction (caudate, lentiform nuclei, internal capsule, insula, M1-M3 cortex): 7 - Supraganglionic infarction (M4-M6 cortex): 3 Total score (0-10 with 10 being normal): 10 IMPRESSION: 1. Normal head CT 2. ASPECTS is 10 These results were called by telephone at the time of interpretation on 03/21/2019 at 2:03 am to provider [facility] , who verbally acknowledged these results. Electronically Signed   By: Deatra Robinson M.D.   On: 03/21/2019 02:08    Catarina Hartshorn, DO  [facility] Pager [phone]  If 7PM-7AM, please contact night-coverage [email or web address] Password [facility] 03/21/2019, 7:35 AM   LOS: 0 days

## 2019-03-21 NOTE — Discharge Instructions (Signed)
Your testing today revealed normal blood work and a normal CAT scan of your brain.  I reviewed your CAT scan from the [facility] which showed no problems in your abdomen, no cause of your pain.  Sometimes people have pain for unknown reasons.  You will need to follow-up with your family doctor for ongoing treatment of this pain.  Please drink plenty of clear liquids, do not drive a vehicle while you are taking medicine such as oxycodone and please avoid smoking marijuana.  Follow-up with your family doctor, 65 hours  Emergency department for severe or worsening symptoms

## 2019-03-21 NOTE — ED Triage Notes (Addendum)
[facility] - upon EMS arrival pt was unresponsive, had slurred speech, and was unable to move her right side. Upon arrival to ED pt would not initially follow commands, after EDP aroused pt she would move upper extremities when asked. Pt sent to CT for Code Stroke Head CT. Family called EMS after they found her down. EMS gave Narcan x 2 en route

## 2019-03-21 NOTE — ED Notes (Signed)
[facility] contacted for medical records request at this time.

## 2019-03-21 NOTE — H&P (Addendum)
TRH H&P    Patient Demographics:    Leslie Lowery, is a 48 y.o. female  MRN: [id number]  DOB - [birth year]  Admit Date - 03/21/2019  Referring MD/NP/PA: Eber Hong  Outpatient Primary MD for the patient is Patient, No Pcp Per  Patient coming from:  home  Chief complaint- altered mental status   HPI:    Leslie Lowery  is a 48 y.o. female,  w hypertension, chronic cholecystitis,  anxiety, apparently was found unresponsive by EMS, unable to move right side?.  Pt was given narcan x2 en route.  Pt seemed to respond to narcan ?Marland Kitchen  Pt is a poor historian and keeps changing her story.  Presently she states that her right arm and face were numb starting around 10pm.  Pt notes doing marijuana about 6pm.  Pt has had intermittent blurred vision and headache and notes that her bp has been high due to not taking bp medication and this typically causes her headache.   In ED T afebrile, P 97 R 18 Bp 151/92  Pox 100% on RA WT 109kg  CT brain IMPRESSION: 1. Normal head CT 2. ASPECTS is 10  UDS + Marijuana, + amphetamines, + opiates  Urinalysis negative ETOH <10 INR 0.9  Wbc 8.9, Hgb 12.3, Plt 240 Na 140, K 3.5, Bun 14, Creatinine 0.81 Ast 14, Alt 18  Pt will be admitted for AMS, r/o CVA         Review of systems:    In addition to the HPI above,  No Fever-chills,   No problems swallowing food or Liquids, No Chest pain, Cough or Shortness of Breath, No Abdominal pain, No Nausea or Vomiting, bowel movements are regular, No Blood in stool or Urine, No dysuria, No new skin rashes or bruises, No new joints pains-aches,  No new weakness,   No recent weight gain or loss, No polyuria, polydypsia or polyphagia, No significant Mental Stressors.  All other systems reviewed and are negative.    Past History of the following :    Past Medical History:  Diagnosis Date  . Anxiety   . Disc  disease, degenerative, cervical   . Hypertension       Past Surgical History:  Procedure Laterality Date  . CESAREAN SECTION     X 2  . CHOLECYSTECTOMY N/A 09/07/2018   Procedure: LAPAROSCOPIC CHOLECYSTECTOMY;  Surgeon: Lucretia Roers, MD;  Location: [facility];  Service: General;  Laterality: N/A;  . LIVER BIOPSY N/A 09/07/2018   Procedure: LAPAROSCOPIC  LIVER BIOPSY;  Surgeon: Lucretia Roers, MD;  Location: [facility];  Service: General;  Laterality: N/A;      Social History:      Social History   Tobacco Use  . Smoking status: Current Every Day Smoker    Packs/day: 1.00    Types: Cigarettes  . Smokeless tobacco: Never Used  Substance Use Topics  . Alcohol use: No       Family History :     Family History  Problem Relation Age of Onset  .  Diabetes Mother   . CAD Mother   . CAD Father   . Diabetes Father   . Throat cancer Father   . Colon cancer Neg Hx   . Colon polyps Neg Hx        no first degree relatives with polyps  . Liver disease Neg Hx        Home Medications:   Prior to Admission medications   Medication Sig Start Date End Date Taking? Authorizing Provider  Aspirin-Salicylamide-Caffeine (BC HEADACHE) 325-95-16 MG TABS Take 1-2 packets by mouth daily as needed (for pain).    [provider]  diphenoxylate-atropine (LOMOTIL) 2.5-0.025 MG tablet Take 1 tablet by mouth 4 (four) times daily as needed for diarrhea or loose stools (and cramping). 10/29/18   Evalee Jefferson, PA-C  hyoscyamine (LEVSIN SL) 0.125 MG SL tablet Place 1 tablet (0.125 mg total) under the tongue every 4 (four) hours as needed. For abdominal discomfort 10/27/18   Annitta Needs, NP  ibuprofen (ADVIL) 200 MG tablet Take 200 mg by mouth every 6 (six) hours as needed for moderate pain.     [provider]  ondansetron (ZOFRAN) 4 MG tablet Take 1 tablet (4 mg total) by mouth every 6 (six) hours as needed for nausea. 09/08/18   Virl Cagey, MD  pantoprazole (PROTONIX) 40 MG  tablet Take 1 tablet (40 mg total) by mouth 2 (two) times daily. Patient taking differently: Take 40 mg by mouth as needed.  09/08/18   Virl Cagey, MD     Allergies:    No Known Allergies   Physical Exam:   Vitals  Blood pressure 137/88, pulse 88, resp. rate (!) 21, height 5\' 4"  (1.626 m), weight 109 kg, last menstrual period 11/01/2008, SpO2 97 %.  1.  General: axoxo3  2. Psychiatric: euthymic  3. Neurologic: Cn 2-12 intact, reflexes 2+ symmetric, diffuse with no clonus, motor 5/5 in all 4 ext  4. HEENMT:  Aniciteric, pupils 1.82mm symmetric, direct, consensual, near intact, eomi,  Neck: no jvd  5. Respiratory : CTAB  6. Cardiovascular : rrr s1, s2,   7. Gastrointestinal:  Abd: soft, nt, nd, +bs  8. Skin:  Ext: no c/c/e, no rash  9.Musculoskeletal:  Good ROM    Data Review:    CBC Recent Labs  Lab 03/21/19 0302  WBC 8.9  HGB 12.3  HCT 38.7  PLT 240  MCV 86.4  MCH 27.5  MCHC 31.8  RDW 14.0  LYMPHSABS 1.6  MONOABS 0.4  EOSABS 0.1  BASOSABS 0.0   ------------------------------------------------------------------------------------------------------------------  Results for orders placed or performed during the hospital encounter of 03/21/19 (from the past 48 hour(s))  Urine rapid drug screen (hosp performed)     Status: Abnormal   Collection Time: 03/21/19  2:40 AM  Result Value Ref Range   Opiates POSITIVE (A) NONE DETECTED   Cocaine NONE DETECTED NONE DETECTED   Benzodiazepines NONE DETECTED NONE DETECTED   Amphetamines POSITIVE (A) NONE DETECTED   Tetrahydrocannabinol POSITIVE (A) NONE DETECTED   Barbiturates NONE DETECTED NONE DETECTED    Comment: (NOTE) DRUG SCREEN FOR MEDICAL PURPOSES ONLY.  IF CONFIRMATION IS NEEDED FOR ANY PURPOSE, NOTIFY LAB WITHIN 5 DAYS. LOWEST DETECTABLE LIMITS FOR URINE DRUG SCREEN Drug Class                     Cutoff (ng/mL) Amphetamine and metabolites    1000 Barbiturate and metabolites     200 Benzodiazepine  200 Tricyclics and metabolites     300 Opiates and metabolites        300 Cocaine and metabolites        300 THC                            50 Performed at [facility], [address], [place], [place] [address]   Urinalysis, Routine w reflex microscopic     Status: None   Collection Time: 03/21/19  2:40 AM  Result Value Ref Range   Color, Urine YELLOW YELLOW   APPearance CLEAR CLEAR   Specific Gravity, Urine 1.015 1.005 - 1.030   pH 6.0 5.0 - 8.0   Glucose, UA NEGATIVE NEGATIVE mg/dL   Hgb urine dipstick NEGATIVE NEGATIVE   Bilirubin Urine NEGATIVE NEGATIVE   Ketones, ur NEGATIVE NEGATIVE mg/dL   Protein, ur NEGATIVE NEGATIVE mg/dL   Nitrite NEGATIVE NEGATIVE   Leukocytes,Ua NEGATIVE NEGATIVE    Comment: Performed at [facility], [address] [place], [place] [address]  Ethanol     Status: None   Collection Time: 03/21/19  3:02 AM  Result Value Ref Range   Alcohol, Ethyl (B) <10 <10 mg/dL    Comment: (NOTE) Lowest detectable limit for serum alcohol is 10 mg/dL. For medical purposes only. Performed at [facility], [address], [place], [place] [address]   Protime-INR     Status: None   Collection Time: 03/21/19  3:02 AM  Result Value Ref Range   Prothrombin Time 12.2 11.4 - 15.2 seconds   INR 0.9 0.8 - 1.2    Comment: (NOTE) INR goal varies based on device and disease states. Performed at [facility], [address] [place], [place] [address]   APTT     Status: None   Collection Time: 03/21/19  3:02 AM  Result Value Ref Range   aPTT 26 24 - 36 seconds    Comment: Performed at [facility], [address], [place], [place] [address]  CBC     Status: None   Collection Time: 03/21/19  3:02 AM  Result Value Ref Range   WBC 8.9 4.0 - 10.5 K/uL   RBC 4.48 3.87 - 5.11 MIL/uL   Hemoglobin 12.3 12.0 - 15.0 g/dL   HCT 13.0 86.5 - 78.4 %   MCV 86.4 80.0 - 100.0 fL   MCH 27.5 26.0 - 34.0 pg   MCHC 31.8 30.0 - 36.0 g/dL   RDW 69.6  29.5 - 28.4 %   Platelets 240 150 - 400 K/uL   nRBC 0.0 0.0 - 0.2 %    Comment: Performed at [facility], [address], [place], [place] [address]  Differential     Status: None   Collection Time: 03/21/19  3:02 AM  Result Value Ref Range   Neutrophils Relative % 77 %   Neutro Abs 6.8 1.7 - 7.7 K/uL   Lymphocytes Relative 18 %   Lymphs Abs 1.6 0.7 - 4.0 K/uL   Monocytes Relative 4 %   Monocytes Absolute 0.4 0.1 - 1.0 K/uL   Eosinophils Relative 1 %   Eosinophils Absolute 0.1 0.0 - 0.5 K/uL   Basophils Relative 0 %   Basophils Absolute 0.0 0.0 - 0.1 K/uL   Immature Granulocytes 0 %   Abs Immature Granulocytes 0.02 0.00 - 0.07 K/uL    Comment: Performed at [facility], [address], [place], [place] [address]  Comprehensive metabolic panel  Status: Abnormal   Collection Time: 03/21/19  3:02 AM  Result Value Ref Range   Sodium 140 135 - 145 mmol/L   Potassium 3.5 3.5 - 5.1 mmol/L   Chloride 105 98 - 111 mmol/L   CO2 26 22 - 32 mmol/L   Glucose, Bld 114 (H) 70 - 99 mg/dL   BUN 14 6 - 20 mg/dL   Creatinine, Ser 6.040.81 0.44 - 1.00 mg/dL   Calcium 9.0 8.9 - 54.010.3 mg/dL   Total Protein 6.9 6.5 - 8.1 g/dL   Albumin 3.9 3.5 - 5.0 g/dL   AST 14 (L) 15 - 41 U/L   ALT 18 0 - 44 U/L   Alkaline Phosphatase 72 38 - 126 U/L   Total Bilirubin 0.6 0.3 - 1.2 mg/dL   GFR calc non Af Amer >60 >60 mL/min   GFR calc Af Amer >60 >60 mL/min   Anion gap 9 5 - 15    Comment: Performed at [facility], [address] [place], [place] [id number]    Chemistries  Recent Labs  Lab 03/21/19 0302  NA 140  K 3.5  CL 105  CO2 26  GLUCOSE 114*  BUN 14  CREATININE 0.81  CALCIUM 9.0  AST 14*  ALT 18  ALKPHOS 72  BILITOT 0.6   ------------------------------------------------------------------------------------------------------------------  ------------------------------------------------------------------------------------------------------------------ GFR: Estimated Creatinine Clearance:  101.3 mL/min (by C-G formula based on SCr of 0.81 mg/dL). Liver Function Tests: Recent Labs  Lab 03/21/19 0302  AST 14*  ALT 18  ALKPHOS 72  BILITOT 0.6  PROT 6.9  ALBUMIN 3.9   No results for input(s): LIPASE, AMYLASE in the last 168 hours. No results for input(s): AMMONIA in the last 168 hours. Coagulation Profile: Recent Labs  Lab 03/21/19 0302  INR 0.9   Cardiac Enzymes: No results for input(s): CKTOTAL, CKMB, CKMBINDEX, TROPONINI in the last 168 hours. BNP (last 3 results) No results for input(s): PROBNP in the last 8760 hours. HbA1C: No results for input(s): HGBA1C in the last 72 hours. CBG: No results for input(s): GLUCAP in the last 168 hours. Lipid Profile: No results for input(s): CHOL, HDL, LDLCALC, TRIG, CHOLHDL, LDLDIRECT in the last 72 hours. Thyroid Function Tests: No results for input(s): TSH, T4TOTAL, FREET4, T3FREE, THYROIDAB in the last 72 hours. Anemia Panel: No results for input(s): VITAMINB12, FOLATE, FERRITIN, TIBC, IRON, RETICCTPCT in the last 72 hours.  --------------------------------------------------------------------------------------------------------------- Urine analysis:    Component Value Date/Time   COLORURINE YELLOW 03/21/2019 0240   APPEARANCEUR CLEAR 03/21/2019 0240   LABSPEC 1.015 03/21/2019 0240   PHURINE 6.0 03/21/2019 0240   GLUCOSEU NEGATIVE 03/21/2019 0240   HGBUR NEGATIVE 03/21/2019 0240   BILIRUBINUR NEGATIVE 03/21/2019 0240   KETONESUR NEGATIVE 03/21/2019 0240   PROTEINUR NEGATIVE 03/21/2019 0240   UROBILINOGEN 0.2 04/08/2008 1128   NITRITE NEGATIVE 03/21/2019 0240   LEUKOCYTESUR NEGATIVE 03/21/2019 0240      Imaging Results:    CT HEAD CODE STROKE WO CONTRAST  Result Date: 03/21/2019 CLINICAL DATA:  Code stroke. EXAM: CT HEAD WITHOUT CONTRAST TECHNIQUE: Contiguous axial images were obtained from the base of the skull through the vertex without intravenous contrast. COMPARISON:  None. FINDINGS: Brain: There is  no mass, hemorrhage or extra-axial collection. The size and configuration of the ventricles and extra-axial CSF spaces are normal. The brain parenchyma is normal, without evidence of acute or chronic infarction. Vascular: No abnormal hyperdensity of the major intracranial arteries or dural venous sinuses. No intracranial atherosclerosis. Skull: The visualized skull base, calvarium  and extracranial soft tissues are normal. Sinuses/Orbits: No fluid levels or advanced mucosal thickening of the visualized paranasal sinuses. No mastoid or middle ear effusion. The orbits are normal. ASPECTS [facility] Stroke Program Early CT Score) - Ganglionic level infarction (caudate, lentiform nuclei, internal capsule, insula, M1-M3 cortex): 7 - Supraganglionic infarction (M4-M6 cortex): 3 Total score (0-10 with 10 being normal): 10 IMPRESSION: 1. Normal head CT 2. ASPECTS is 10 These results were called by telephone at the time of interpretation on 03/21/2019 at 2:03 am to provider [facility] , who verbally acknowledged these results. Electronically Signed   By: Deatra Robinson M.D.   On: 03/21/2019 02:08       Assessment & Plan:    Principal Problem:   Altered mental status  AMS r/o TIA/ CVA Headache MRI brain  Check Carotid ultrasound Check cardiac echo  Check hga1c, lipid PT/OT/ Speech Aspirin Lipitor  po qhs Permissive hypertension  Polysubstance abuse counselled on cessation.   Gerd Cont PPI  Chronic RUQ pain RUQ ultrasound  DVT Prophylaxis-    - SCDs   AM Labs Ordered, also please review Full Orders  Family Communication: Admission, patients condition and plan of care including tests being ordered have been discussed with the patient  who indicate understanding and agree with the plan and Code Status.  Code Status:  FULL  CODE per patient  Admission status: Observation: Based on patients clinical presentation and evaluation of above clinical data, I have made determination that patient  meets Observation criteria at this time.   Time spent in minutes : 55 minutes   Pearson Grippe M.D on 03/21/2019 at 5:04 AM

## 2019-03-21 NOTE — Consult Note (Addendum)
[facility] Leslie Schwebke A. Gerilyn Pilgrim, MD     [email or web address]          Leslie Lowery is an 48 y.o. female.   ASSESSMENT/PLAN: 1.  UNEXPLAINED ENCEPHALOPATHY:  Medication effect his suspected to be the most likely etiology.  Toxicology is positive for opioids and amphetamines. The opioids are curious however as the routine drug screen does not pick up oxycodone which is being prescribed. This is therefore very suggestive that other opioids are on board. Other suspected etiologies include seizures and obstructive sleep apnea syndrome. Imaging an and presentation does not indicate structural abnormalities or infectious causes.  Formal urine toxicology for more detailed analysis is being obtained. 2.  Episodic headaches apparently associated with the elevation of blood pressure.  Although the patient demographics makes her risk for pseudotumor cerebri, the presentation does not suggest this diagnosis at this time.3. Likely untreated obstructive sleep apnea syndrome: Considered outpatient testing 4. Vitamin B12 deficiency: This is being replaced. 5.  Obesity 6.  Hypertension 7.  Chronic pain syndrome 8.  Unexplained right upper quadrant and right lower quadrant abdominal pain.      The patient is a 48 year old white female who reports not feeling well yesterday.  She indicates she has had  Right upper quadrant and wire lower quadrant abdominal pain for last few days. She was seen in the [facility] seen and evaluated and released.  She was told that nothing was wrong. She seems somewhat irritated about not getting answers. She reports dizziness followed by a sensation that she was not able to move. She lost consciousness and the was taken to the hospital. She is amnestic to what happened. In the emergency room she was noted to be stuporous and confused. It appears the patient was given Narcan with some modest response. The patient is on a few psychotropic medications. She reports  not taking more than prescribed. She reports not taking other medications that are non-prescribed. She does uses marijuana routinely. She denies any illicit drugs however. Patient reports that she was not told of having any tonic-clonic activity. There is no history of seizures. She reports not being prescribed any new medications. She does have severe headaches from time to time apparently associated with her blood pressure being up. She reports she has had this particularly worse over last 2 months. They do not occur daily but only when her blood pressures up. The patient does not report having chest pain or palpitations. There are no reports of focal numbness or weakness but again she reports generalized weakness. No bladder or bowel incontinence no oral trauma. The review systems otherwise negative.   GENERAL:  Patient is somewhat irritated for be in the hospital and not getting an answer as to why she is ill.  HEENT:  She has a large neck and crowded posterior airway. Neck is supple; no trauma appreciated.  ABDOMEN: soft  EXTREMITIES: No edema   BACK:  SKIN: Normal by inspection.    MENTAL STATUS: Alert and oriented. Speech, language and cognition are generally intact. Judgment and insight normal.   CRANIAL NERVES: Pupils are equal, round and reactive to light and accomodation; extra ocular movements are full, there is no significant nystagmus; visual fields are full; upper and lower facial muscles are normal in strength and symmetric, there is no flattening of the nasolabial folds; tongue is midline; uvula is midline; shoulder elevation is normal.  MOTOR: Normal tone, bulk and strength; no pronator drift.  COORDINATION: Left finger to nose is  normal, right finger to nose is normal, No rest tremor; no intention tremor; no postural tremor; no bradykinesia.  REFLEXES: Deep tendon reflexes are symmetrical and normal.   SENSATION: Normal to light touch, temperature, and  pain.           EEG DESCRIPTION:The posterior dominant rhythm consists of 8 Hz activity of moderate voltage (25-35 uV) seen predominantly in posterior head regions, symmetric and reactive to eye opening and eye closing. Physiologic photic driving was seen during photic stimulation. Hyperventilation was not performed.  IMPRESSION: This study is within normal limits. No seizures or epileptiform discharges were seen throughout the recording.   Blood pressure 118/81, pulse 73, resp. rate 14, height 5\' 4"  (1.626 m), weight 109 kg, last menstrual period 11/01/2008, SpO2 97 %.  Past Medical History:  Diagnosis Date  . Anxiety   . Disc disease, degenerative, cervical   . Hypertension     Past Surgical History:  Procedure Laterality Date  . CESAREAN SECTION     X 2  . CHOLECYSTECTOMY N/A 09/07/2018   Procedure: LAPAROSCOPIC CHOLECYSTECTOMY;  Surgeon: Virl Cagey, MD;  Location: [facility];  Service: General;  Laterality: N/A;  . LIVER BIOPSY N/A 09/07/2018   Procedure: LAPAROSCOPIC  LIVER BIOPSY;  Surgeon: Virl Cagey, MD;  Location: [facility];  Service: General;  Laterality: N/A;    Family History  Problem Relation Age of Onset  . Diabetes Mother   . CAD Mother   . CAD Father   . Diabetes Father   . Throat cancer Father   . Colon cancer Neg Hx   . Colon polyps Neg Hx        no first degree relatives with polyps  . Liver disease Neg Hx     Social History:  reports that she has been smoking cigarettes. She has been smoking about 1.00 pack per day. She has never used smokeless tobacco. She reports current drug use. Drug: Marijuana. She reports that she does not drink alcohol.  Allergies: No Known Allergies  Medications: Prior to Admission medications   Medication Sig Start Date End Date Taking? Authorizing Provider  albuterol (VENTOLIN HFA) 108 (90 Base) MCG/ACT inhaler Inhale 1-2 puffs into the lungs every 6 (six) hours as needed for wheezing or shortness of breath.    Yes [provider]  amphetamine-dextroamphetamine (ADDERALL) 20 MG tablet Take 20 mg by mouth daily. 03/04/19  Yes [provider]  oxyCODONE (OXY IR/ROXICODONE) 5 MG immediate release tablet Take 5 mg by mouth 4 (four) times daily. 03/04/19  Yes [provider]    Scheduled Meds: .  stroke: mapping our early stages of recovery book   Does not apply Once  . aspirin  300 mg Rectal Daily   Or  . aspirin  325 mg Oral Daily  . atorvastatin  80 mg Oral q1800  . pantoprazole (PROTONIX) IV  40 mg Intravenous Q12H   Continuous Infusions: . 0.9 % NaCl with KCl 20 mEq / L 75 mL/hr at 03/21/19 1147   PRN Meds:.acetaminophen **OR** acetaminophen (TYLENOL) oral liquid 160 mg/5 mL **OR** acetaminophen     Results for orders placed or performed during the hospital encounter of 03/21/19 (from the past 48 hour(s))  Urine rapid drug screen (hosp performed)     Status: Abnormal   Collection Time: 03/21/19  2:40 AM  Result Value Ref Range   Opiates POSITIVE (A) NONE DETECTED   Cocaine NONE DETECTED NONE DETECTED   Benzodiazepines NONE DETECTED NONE DETECTED  Amphetamines POSITIVE (A) NONE DETECTED   Tetrahydrocannabinol POSITIVE (A) NONE DETECTED   Barbiturates NONE DETECTED NONE DETECTED    Comment: (NOTE) DRUG SCREEN FOR MEDICAL PURPOSES ONLY.  IF CONFIRMATION IS NEEDED FOR ANY PURPOSE, NOTIFY LAB WITHIN 5 DAYS. LOWEST DETECTABLE LIMITS FOR URINE DRUG SCREEN Drug Class                     Cutoff (ng/mL) Amphetamine and metabolites    1000 Barbiturate and metabolites    200 Benzodiazepine                 200 Tricyclics and metabolites     300 Opiates and metabolites        300 Cocaine and metabolites        300 THC                            50 Performed at [facility], [address], [place], [place] [address]   Urinalysis, Routine w reflex microscopic     Status: None   Collection Time: 03/21/19  2:40 AM  Result Value Ref Range   Color, Urine YELLOW  YELLOW   APPearance CLEAR CLEAR   Specific Gravity, Urine 1.015 1.005 - 1.030   pH 6.0 5.0 - 8.0   Glucose, UA NEGATIVE NEGATIVE mg/dL   Hgb urine dipstick NEGATIVE NEGATIVE   Bilirubin Urine NEGATIVE NEGATIVE   Ketones, ur NEGATIVE NEGATIVE mg/dL   Protein, ur NEGATIVE NEGATIVE mg/dL   Nitrite NEGATIVE NEGATIVE   Leukocytes,Ua NEGATIVE NEGATIVE    Comment: Performed at [facility], [address], [place], [place] [address]  Ethanol     Status: None   Collection Time: 03/21/19  3:02 AM  Result Value Ref Range   Alcohol, Ethyl (B) <10 <10 mg/dL    Comment: (NOTE) Lowest detectable limit for serum alcohol is 10 mg/dL. For medical purposes only. Performed at [facility], [address] [place], [place] [address]   Protime-INR     Status: None   Collection Time: 03/21/19  3:02 AM  Result Value Ref Range   Prothrombin Time 12.2 11.4 - 15.2 seconds   INR 0.9 0.8 - 1.2    Comment: (NOTE) INR goal varies based on device and disease states. Performed at [facility], [address], [place], [place] [address]   APTT     Status: None   Collection Time: 03/21/19  3:02 AM  Result Value Ref Range   aPTT 26 24 - 36 seconds    Comment: Performed at [facility], [address], [place], [place] [address]  CBC     Status: None   Collection Time: 03/21/19  3:02 AM  Result Value Ref Range   WBC 8.9 4.0 - 10.5 K/uL   RBC 4.48 3.87 - 5.11 MIL/uL   Hemoglobin 12.3 12.0 - 15.0 g/dL   HCT 78.4 69.6 - 29.5 %   MCV 86.4 80.0 - 100.0 fL   MCH 27.5 26.0 - 34.0 pg   MCHC 31.8 30.0 - 36.0 g/dL   RDW 28.4 13.2 - 44.0 %   Platelets 240 150 - 400 K/uL   nRBC 0.0 0.0 - 0.2 %    Comment: Performed at [facility], [address] [place], [place] [address]  Differential     Status: None   Collection Time: 03/21/19  3:02 AM  Result Value Ref Range   Neutrophils Relative % 77 %  Neutro Abs 6.8 1.7 - 7.7 K/uL   Lymphocytes Relative 18 %   Lymphs Abs 1.6 0.7 - 4.0 K/uL   Monocytes Relative 4 %    Monocytes Absolute 0.4 0.1 - 1.0 K/uL   Eosinophils Relative 1 %   Eosinophils Absolute 0.1 0.0 - 0.5 K/uL   Basophils Relative 0 %   Basophils Absolute 0.0 0.0 - 0.1 K/uL   Immature Granulocytes 0 %   Abs Immature Granulocytes 0.02 0.00 - 0.07 K/uL    Comment: Performed at [facility], [address] [place], [place] [address]  Comprehensive metabolic panel     Status: Abnormal   Collection Time: 03/21/19  3:02 AM  Result Value Ref Range   Sodium 140 135 - 145 mmol/L   Potassium 3.5 3.5 - 5.1 mmol/L   Chloride 105 98 - 111 mmol/L   CO2 26 22 - 32 mmol/L   Glucose, Bld 114 (H) 70 - 99 mg/dL   BUN 14 6 - 20 mg/dL   Creatinine, Ser 2.13 0.44 - 1.00 mg/dL   Calcium 9.0 8.9 - 08.6 mg/dL   Total Protein 6.9 6.5 - 8.1 g/dL   Albumin 3.9 3.5 - 5.0 g/dL   AST 14 (L) 15 - 41 U/L   ALT 18 0 - 44 U/L   Alkaline Phosphatase 72 38 - 126 U/L   Total Bilirubin 0.6 0.3 - 1.2 mg/dL   GFR calc non Af Amer >60 >60 mL/min   GFR calc Af Amer >60 >60 mL/min   Anion gap 9 5 - 15    Comment: Performed at [facility], [address] [place], [place] [address]  HIV Antibody (routine testing w rflx)     Status: None   Collection Time: 03/21/19  5:44 AM  Result Value Ref Range   HIV Screen 4th Generation wRfx NON REACTIVE NON REACTIVE    Comment: Performed at [facility], [address], [place], [place] [address]  Hemoglobin A1c     Status: None   Collection Time: 03/21/19  5:44 AM  Result Value Ref Range   Hgb A1c MFr Bld 5.6 4.8 - 5.6 %    Comment: (NOTE) Pre diabetes:          5.7%-6.4% Diabetes:              >6.4% Glycemic control for   <7.0% adults with diabetes    Mean Plasma Glucose 114.02 mg/dL    Comment: Performed at [facility], [address], [place], [place] [address]  Lipid panel     Status: Abnormal   Collection Time: 03/21/19  5:44 AM  Result Value Ref Range   Cholesterol 204 (H) 0 - 200 mg/dL   Triglycerides 244 (H) <150 mg/dL   HDL 25 (L) >01 mg/dL   Total  CHOL/HDL Ratio 8.2 RATIO   VLDL 37 0 - 40 mg/dL   LDL Cholesterol 027 (H) 0 - 99 mg/dL    Comment:        Total Cholesterol/HDL:CHD Risk Coronary Heart Disease Risk Table                     Men   Women  1/2 Average Risk   3.4   3.3  Average Risk       5.0   4.4  2 X Average Risk   9.6   7.1  3 X Average Risk  23.4   11.0  Use the calculated Patient Ratio above and the CHD Risk Table to determine the patient's CHD Risk.        ATP III CLASSIFICATION (LDL):  <100     mg/dL   Optimal  [id number]  mg/dL   Near or Above                    Optimal  130-159  mg/dL   Borderline  [id number]  mg/dL   High  >811>190     mg/dL   Very High Performed at [facility], [address] [place], [place] [address]   TSH     Status: None   Collection Time: 03/21/19  5:44 AM  Result Value Ref Range   TSH 1.654 0.350 - 4.500 uIU/mL    Comment: Performed by a 3rd Generation assay with a functional sensitivity of <=0.01 uIU/mL. Performed at [facility], [address] [place], [place] [address]   Sedimentation rate     Status: None   Collection Time: 03/21/19  5:44 AM  Result Value Ref Range   Sed Rate 8 0 - 22 mm/hr    Comment: Performed at [facility], [address] [place], [place] [address]  RPR     Status: None   Collection Time: 03/21/19  5:44 AM  Result Value Ref Range   RPR Ser Ql NON REACTIVE NON REACTIVE    Comment: Performed at [facility], [address] [place], [place] [address]  Vitamin B12     Status: Abnormal   Collection Time: 03/21/19  8:21 AM  Result Value Ref Range   Vitamin B-12 105 (L) 180 - 914 pg/mL    Comment: (NOTE) This assay is not validated for testing neonatal or myeloproliferative syndrome specimens for Vitamin B12 levels. Performed at [facility], [address] [place], [place] [address]   Ammonia     Status: None   Collection Time: 03/21/19  8:21 AM  Result Value Ref Range   Ammonia 21 9 - 35 umol/L    Comment: Performed at [facility], [address] [place], [place] [address]  Folate     Status: None   Collection Time: 03/21/19  8:21 AM  Result Value Ref Range   Folate 7.4 >5.9 ng/mL    Comment: Performed at [facility], [address] [place], [place] [address]    Studies/Results:  CAROTID IMPRESSION: 1. No significant stenosis or atherosclerotic plaque in either internal carotid artery. 2. The vertebral    ABD- PELVIC CT IMPRESSION: 1. A cause for the patient's right-sided abdominal pain is not identified. 2. Airway thickening is present, suggesting bronchitis or reactive airways disease. 3. Other imaging findings of potential clinical significance: Nonobstructive left nephrolithiasis. Small umbilical hernia contains adipose tissue. Lower lumbar spondylosis and degenerative disc disease causing mild foraminal impingement at L4-5     BRAIN MRI FINDINGS: Brain: Small asymmetric left choroid plexus cyst, normal variant. No restricted diffusion to suggest acute infarction. No midline shift, mass effect, evidence of mass lesion, ventriculomegaly, extra-axial collection or acute intracranial hemorrhage. Cervicomedullary junction and pituitary are within normal limits.  Wallace CullensGray and white matter signal is within normal limits for age throughout the brain. No cortical encephalomalacia or chronic cerebral blood products identified. The deep gray nuclei, brainstem and cerebellum appear normal.  Vascular: Major intracranial vascular flow voids are preserved.  Skull and upper cervical spine: Negative visible cervical spine. Visualized bone marrow signal is within normal limits.  Sinuses/Orbits: Negative orbits.  Paranasal sinuses remain clear.  Other: Trace mastoid fluid. Negative nasopharynx. Grossly negative visible internal auditory structures. Scalp and face soft tissues appear negative.  IMPRESSION: No acute intracranial abnormality. Normal for age non-contrast MRI appearance of the  brain.    MRA BRAIN FINDINGS: Mild motion artifact.  Antegrade flow in the posterior circulation with codominant distal vertebral arteries. No distal vertebral stenosis. The left PICA origin is patent. The right PICA is patent but its origin is not included. Patent vertebrobasilar junction and basilar artery without stenosis. Normal SCA and PCA origins. Small left posterior communicating artery, the right is diminutive or absent. Bilateral PCA branches are within normal limits.  Antegrade flow in both ICA siphons with some ICA tortuosity just below the skull base. No siphon stenosis. Ophthalmic and left posterior communicating artery origins appear normal. Patent carotid termini with normal MCA and ACA origins. Anterior communicating artery is normal. Visible ACA branches are normal aside from mild tortuosity. Left MCA M1 and bifurcation are normal. Visible left MCA branches are within normal limits. Right MCA M1 segment bifurcates early. Bifurcation and visible right MCA branches are within normal limits.  IMPRESSION: Negative intracranial MRA.      The brain MRI shows no acute findings on DWI. There is no hemorrhage, encephalomalacia or other findings. There is no white matter at findings. The scan is normal.    Frimy Uffelman A. Gerilyn Lowery, M.D.  Diplomate, Biomedical engineer of Psychiatry and Neurology ( Neurology). 03/21/2019, 4:49 PM

## 2019-03-21 NOTE — Progress Notes (Signed)
  Echocardiogram 2D Echocardiogram has been performed.  Leslie Lowery 03/21/2019, 4:04 PM

## 2019-03-21 NOTE — ED Provider Notes (Signed)
[facility] EMERGENCY DEPARTMENT Provider Note   CSN: [id number] Arrival date & time: 03/21/19  0147  An emergency department physician performed an initial assessment on this suspected stroke patient at 62.  History Chief Complaint  Patient presents with  . Code Stroke    Leslie Lowery is a 48 y.o. female.  HPI   This patient is a 48 year old female, she has a known history of hypertension and anxiety, she is known to have abdominal pain, dyspepsia, chronic cholecystitis.  She arrives by paramedic transport for altered mental status.  The call went out because the patient was essentially unresponsive.  When they found her they gave her Narcan which woke her up.  She did not seem to be talking right and she seemed to have some right-sided upper extremity weakness.  That being said the patient is able to mumble that she cannot move anything.  Initially the patient will not answer any of my questions however after deep painful stimuli she comes around and starts yelling and is able to move all 4 extremities.  It is unclear exactly why the patient is here, altered mental status gives a level 5 caveat.  The paramedics activated a code stroke prehospital due to the altered speech and the right-sided weakness   Past Medical History:  Diagnosis Date  . Anxiety   . Disc disease, degenerative, cervical   . Hypertension     Patient Active Problem List   Diagnosis Date Noted  . Altered mental status 03/21/2019  . Dyspepsia 10/27/2018  . RLQ abdominal pain 10/27/2018  . Chronic cholecystitis   . Fatty liver   . Nausea vomiting and diarrhea   . RUQ abdominal pain 09/04/2018  . Hepatomegaly 09/04/2018  . Elevated BP without diagnosis of hypertension 09/04/2018    Past Surgical History:  Procedure Laterality Date  . CESAREAN SECTION     X 2  . CHOLECYSTECTOMY N/A 09/07/2018   Procedure: LAPAROSCOPIC CHOLECYSTECTOMY;  Surgeon: Lucretia Roers, MD;  Location: [facility];  Service:  General;  Laterality: N/A;  . LIVER BIOPSY N/A 09/07/2018   Procedure: LAPAROSCOPIC  LIVER BIOPSY;  Surgeon: Lucretia Roers, MD;  Location: [facility];  Service: General;  Laterality: N/A;     OB History    Gravida  2   Para  2   Term  1   Preterm  1   AB      Living        SAB      TAB      Ectopic      Multiple      Live Births              Family History  Problem Relation Age of Onset  . Diabetes Mother   . CAD Mother   . CAD Father   . Diabetes Father   . Throat cancer Father   . Colon cancer Neg Hx   . Colon polyps Neg Hx        no first degree relatives with polyps  . Liver disease Neg Hx     Social History   Tobacco Use  . Smoking status: Current Every Day Smoker    Packs/day: 1.00    Types: Cigarettes  . Smokeless tobacco: Never Used  Substance Use Topics  . Alcohol use: No  . Drug use: Yes    Types: Marijuana    Comment: last use was today    Home Medications Prior to Admission medications  Medication Sig Start Date End Date Taking? Authorizing Provider  Aspirin-Salicylamide-Caffeine (BC HEADACHE) 325-95-16 MG TABS Take 1-2 packets by mouth daily as needed (for pain).    [provider]  diphenoxylate-atropine (LOMOTIL) 2.5-0.025 MG tablet Take 1 tablet by mouth 4 (four) times daily as needed for diarrhea or loose stools (and cramping). 10/29/18   Evalee Jefferson, PA-C  hyoscyamine (LEVSIN SL) 0.125 MG SL tablet Place 1 tablet (0.125 mg total) under the tongue every 4 (four) hours as needed. For abdominal discomfort 10/27/18   Annitta Needs, NP  ibuprofen (ADVIL) 200 MG tablet Take 200 mg by mouth every 6 (six) hours as needed for moderate pain.     [provider]  ondansetron (ZOFRAN) 4 MG tablet Take 1 tablet (4 mg total) by mouth every 6 (six) hours as needed for nausea. 09/08/18   Virl Cagey, MD  pantoprazole (PROTONIX) 40 MG tablet Take 1 tablet (40 mg total) by mouth 2 (two) times daily. Patient taking  differently: Take 40 mg by mouth as needed.  09/08/18   Virl Cagey, MD    Allergies    Patient has no known allergies.  Review of Systems   Review of Systems  Unable to perform ROS: Patient unresponsive    Physical Exam Updated Vital Signs BP (!) 143/96   Pulse 81   Resp (!) 22   Ht 1.626 m (5\' 4" )   Wt 109 kg   LMP 11/01/2008 (Approximate)   SpO2 97%   BMI 41.25 kg/m   Physical Exam Vitals and nursing note reviewed.  Constitutional:      General: She is in acute distress.     Appearance: She is well-developed. She is ill-appearing.  HENT:     Head: Normocephalic and atraumatic.     Mouth/Throat:     Pharynx: No oropharyngeal exudate.  Eyes:     General: No scleral icterus.       Right eye: No discharge.        Left eye: No discharge.     Conjunctiva/sclera: Conjunctivae normal.     Pupils: Pupils are equal, round, and reactive to light.     Comments: Pupils are 4 to 5 mm and reactive  Neck:     Thyroid: No thyromegaly.     Vascular: No JVD.  Cardiovascular:     Rate and Rhythm: Normal rate and regular rhythm.     Heart sounds: Normal heart sounds. No murmur. No friction rub. No gallop.   Pulmonary:     Effort: Pulmonary effort is normal. No respiratory distress.     Breath sounds: Normal breath sounds. No wheezing or rales.  Abdominal:     General: Bowel sounds are normal. There is no distension.     Palpations: Abdomen is soft. There is no mass.     Tenderness: There is no abdominal tenderness.  Musculoskeletal:        General: No tenderness. Normal range of motion.     Cervical back: Normal range of motion and neck supple.  Lymphadenopathy:     Cervical: No cervical adenopathy.  Skin:    General: Skin is warm and dry.     Findings: No erythema or rash.  Neurological:     Mental Status: She is alert.     Coordination: Coordination normal.     Comments: The patient is somnolent to obtunded, with painful stimuli she is able to wake up and mumble  initially incoherently but eventually able to answer  simple questions.  She seems to have inconsistent train of thoughts continuing to talk about pain on her right side.  Initially the patient was not able to lift either of her arms and allow them to fall to the stretcher immediately, same with both of her legs.  She had no obvious facial droop.  When she would speak there was a slight slurred speech with this  Psychiatric:        Behavior: Behavior normal.     ED Results / Procedures / Treatments   Labs (all labs ordered are listed, but only abnormal results are displayed) Labs Reviewed  COMPREHENSIVE METABOLIC PANEL - Abnormal; Notable for the following components:      Result Value   Glucose, Bld 114 (*)    AST 14 (*)    All other components within normal limits  RAPID URINE DRUG SCREEN, HOSP PERFORMED - Abnormal; Notable for the following components:   Opiates POSITIVE (*)    Amphetamines POSITIVE (*)    Tetrahydrocannabinol POSITIVE (*)    All other components within normal limits  SARS CORONAVIRUS 2 (TAT 6-24 HRS)  ETHANOL  PROTIME-INR  APTT  CBC  DIFFERENTIAL  URINALYSIS, ROUTINE W REFLEX MICROSCOPIC  HIV ANTIBODY (ROUTINE TESTING W REFLEX)  HEMOGLOBIN A1C  LIPID PANEL  TSH  SEDIMENTATION RATE  RPR    EKG EKG Interpretation  Date/Time:  Monday March 21 2019 02:37:25 EST Ventricular Rate:  98 PR Interval:    QRS Duration: 112 QT Interval:  343 QTC Calculation: 438 R Axis:   37 Text Interpretation: Sinus rhythm Biatrial enlargement Incomplete right bundle branch block Since last tracing rate faster Confirmed by Eber HongMiller, Shamonique Battiste ([id number]) on 03/21/2019 3:50:04 AM   Radiology CT HEAD CODE STROKE WO CONTRAST  Result Date: 03/21/2019 CLINICAL DATA:  Code stroke. EXAM: CT HEAD WITHOUT CONTRAST TECHNIQUE: Contiguous axial images were obtained from the base of the skull through the vertex without intravenous contrast. COMPARISON:  None. FINDINGS: Brain: There is no  mass, hemorrhage or extra-axial collection. The size and configuration of the ventricles and extra-axial CSF spaces are normal. The brain parenchyma is normal, without evidence of acute or chronic infarction. Vascular: No abnormal hyperdensity of the major intracranial arteries or dural venous sinuses. No intracranial atherosclerosis. Skull: The visualized skull base, calvarium and extracranial soft tissues are normal. Sinuses/Orbits: No fluid levels or advanced mucosal thickening of the visualized paranasal sinuses. No mastoid or middle ear effusion. The orbits are normal. ASPECTS [facility]([place] Stroke Program Early CT Score) - Ganglionic level infarction (caudate, lentiform nuclei, internal capsule, insula, M1-M3 cortex): 7 - Supraganglionic infarction (M4-M6 cortex): 3 Total score (0-10 with 10 being normal): 10 IMPRESSION: 1. Normal head CT 2. ASPECTS is 10 These results were called by telephone at the time of interpretation on 03/21/2019 at 2:03 am to provider [facility] Corm[facility] , who verbally acknowledged these results. Electronically Signed   By: Deatra RobinsonKevin  Herman M.D.   On: 03/21/2019 02:08    Procedures Procedures (including critical care time)  Medications Ordered in ED Medications   stroke: mapping our early stages of recovery book (has no administration in time range)  acetaminophen (TYLENOL) tablet 650 mg (has no administration in time range)    Or  acetaminophen (TYLENOL) 160 MG/5ML solution 650 mg (has no administration in time range)    Or  acetaminophen (TYLENOL) suppository 650 mg (has no administration in time range)  aspirin suppository 300 mg (has no administration in time range)    Or  aspirin  tablet 325 mg (has no administration in time range)  naloxone [facility]) injection 0.4 mg (0.4 mg Intravenous Given 03/21/19 0206)  flumazenil (ROMAZICON) injection 0.2 mg (0.2 mg Intravenous Given 03/21/19 0315)    ED Course  I have reviewed the triage vital signs and the nursing notes.  Pertinent  labs & imaging results that were available during my care of the patient were reviewed by me and considered in my medical decision making (see chart for details).  Clinical Course as of Mar 21 543  Mon Mar 21, 2019  0327 Drug database reviewed and shows that the patient gets chronic oxycodone and dextroamphetamine   [BM]  0328 CT scan from the prior visit at the outside hospital was obtained by fax and reviewed, it was totally normal as was the rest of her labs.   [BM]  0345 Labs are unremarkable with a normal metabolic panel, no alcohol, other labs are unremarkable as well including CBC, drug screen as expected is positive   [BM]    Clinical Course User Index [BM] Eber Hong, MD   MDM Rules/Calculators/A&P                      This patient presents with altered mental status, I question whether this is stroke or whether this is substance related as she did seem to improve with Narcan prehospital.  She does not have myosis however she does have some slurred speech and I question whether substance abuse as part of this.  She has recently been taken to an outlying hospital for her right sided abdominal pain, she is unable to tell me the results of that visit, the paramedics stated that they had to take her there because of the lack of a CT scan machine at this hospital.  This was 2 days ago.  The patient is intermittently able to use both of her arms.  At one point I asked to see her side and she was able to lift her right arm for me.  I see no obvious facial droop.  It is difficult to test her vision since she will not open her eyes for me.  Will activate code stroke, get neurology input, CT scan of the head is negative.  At 2:04 AM I received a call from the radiologist stating there was no acute findings.  Care was discussed with the neurologist at 2:15 AM, they agree that this is unlikely to be related to an acute ischemic stroke, they do not think the patient needs to be admitted for a  stroke work-up.  This is more likely to be metabolic related to substance abuses.  I suspect the patient has some element of both opiates and benzodiazepines as she continues to improve temporarily with Narcan.  She is maintaining her airway.  Labs pending.  CT negative  The patient's drug screen is positive for opiates amphetamines and marijuana.  Based on the patient's medications she does report that she takes oxycodone however I see no use of stimulant or amphetamine type medications.  On repeat evaluation the nurse came to tell me that the patient was somnolent to obtunded and required significant stimulation to wake her up at which time she appeared to have a bit of a slurring her speech and seem to have some facial droop.  I immediately went in the room and the patient was talking briskly complaining of her right lower quadrant abdominal pain which is been hurting for weeks and for which  she is very frustrated.  I do not notice any focal neurologic deficits and her level of alertness is okay.  She was given flumazenil without any significant improvement, she continues on my exam to be answering questions and moving all 4 extremities, she has no back to her normal self which she was when the teleneurologist saw her on her initial evaluation.  This waxing and waning neurologic exam and mental status is strange and may be related to substances.  The pt is now stating that tonight - she was having trouble using her R arm to grab her cigarettes, called her daughter to come and get her with ambulance - at the same time, she reports some blurred vision - lasted < 1 minute - The grouping of symptoms seems to be pointing to a more focal cause, endorses not taking her BP meds, her cholesterol meds and smokes - she has no focal abnormalities on my examl.  Will admit due to possible TIA.  I discussed the case with Dr. Selena Batten who will admit   Leslie Lowery was evaluated in Emergency Department on 03/21/2019  for the symptoms described in the history of present illness. She was evaluated in the context of the global COVID-19 pandemic, which necessitated consideration that the patient might be at risk for infection with the SARS-CoV-2 virus that causes COVID-19. Institutional protocols and algorithms that pertain to the evaluation of patients at risk for COVID-19 are in a state of rapid change based on information released by regulatory bodies including the CDC and federal and state organizations. These policies and algorithms were followed during the patient's care in the ED.   Final Clinical Impression(s) / ED Diagnoses Final diagnoses:  Altered mental status, unspecified altered mental status type  TIA (transient ischemic attack)      Eber Hong, MD 03/21/19 [phone]

## 2019-03-21 NOTE — ED Notes (Signed)
ED TO INPATIENT HANDOFF REPORT  ED Nurse Name and Phone #: Zarinah Oviatt [phone]  S Name/Age/Gender Quentin MullingKimberly D Slager 48 y.o. female Room/Bed: APA01/APA01  Code Status   Code Status: Full Code  Home/SNF/Other Home Patient oriented to: self, place, time and situation Is this baseline? Yes   Triage Complete: Triage complete  Chief Complaint Altered mental status [R41.82]  Triage Note [facility] - upon EMS arrival pt was unresponsive, had slurred speech, and was unable to move her right side. Upon arrival to ED pt would not initially follow commands, after EDP aroused pt she would move upper extremities when asked. Pt sent to CT for Code Stroke Head CT. Family called EMS after they found her down. EMS gave Narcan x 2 en route    Allergies No Known Allergies  Level of Care/Admitting Diagnosis ED Disposition    ED Disposition Condition Comment   Admit  [facility] [[id number]]  Level of Care: Telemetry [5]  Covid Evaluation: Asymptomatic Screening Protocol (No Symptoms)  Diagnosis: Altered mental status [780.97.ICD-9-CM]  Admitting Physician: Pearson GrippeKIM, JAMES [[id number]]  Attending Physician: Pearson GrippeKIM, JAMES [phone][[id number]]       B Medical/Surgery History Past Medical History:  Diagnosis Date  . Anxiety   . Disc disease, degenerative, cervical   . Hypertension    Past Surgical History:  Procedure Laterality Date  . CESAREAN SECTION     X 2  . CHOLECYSTECTOMY N/A 09/07/2018   Procedure: LAPAROSCOPIC CHOLECYSTECTOMY;  Surgeon: Lucretia RoersBridges, Lindsay C, MD;  Location: [facility];  Service: General;  Laterality: N/A;  . LIVER BIOPSY N/A 09/07/2018   Procedure: LAPAROSCOPIC  LIVER BIOPSY;  Surgeon: Lucretia RoersBridges, Lindsay C, MD;  Location: [facility];  Service: General;  Laterality: N/A;     A IV Location/Drains/Wounds Patient Lines/Drains/Airways Status   Active Line/Drains/Airways    Name:   Placement date:   Placement time:   Site:   Days:   Peripheral IV 03/21/19 Left Hand   03/21/19    0147     Hand   less than 1   Incision (Closed) 09/07/18 Abdomen Other (Comment)   09/07/18    1257     195   Incision - 4 Ports Abdomen 1: Mid;Upper 2: Umbilicus 3: Right;Upper 4: Right;Lateral;Upper   09/07/18    1209     195          Intake/Output Last 24 hours No intake or output data in the 24 hours ending 03/21/19 1743  Labs/Imaging Results for orders placed or performed during the hospital encounter of 03/21/19 (from the past 48 hour(s))  Urine rapid drug screen (hosp performed)     Status: Abnormal   Collection Time: 03/21/19  2:40 AM  Result Value Ref Range   Opiates POSITIVE (A) NONE DETECTED   Cocaine NONE DETECTED NONE DETECTED   Benzodiazepines NONE DETECTED NONE DETECTED   Amphetamines POSITIVE (A) NONE DETECTED   Tetrahydrocannabinol POSITIVE (A) NONE DETECTED   Barbiturates NONE DETECTED NONE DETECTED    Comment: (NOTE) DRUG SCREEN FOR MEDICAL PURPOSES ONLY.  IF CONFIRMATION IS NEEDED FOR ANY PURPOSE, NOTIFY LAB WITHIN 5 DAYS. LOWEST DETECTABLE LIMITS FOR URINE DRUG SCREEN Drug Class                     Cutoff (ng/mL) Amphetamine and metabolites    1000 Barbiturate and metabolites    200 Benzodiazepine                 200 Tricyclics and  metabolites     300 Opiates and metabolites        300 Cocaine and metabolites        300 THC                            50 Performed at [facility], [address] [place], [place] [address]   Urinalysis, Routine w reflex microscopic     Status: None   Collection Time: 03/21/19  2:40 AM  Result Value Ref Range   Color, Urine YELLOW YELLOW   APPearance CLEAR CLEAR   Specific Gravity, Urine 1.015 1.005 - 1.030   pH 6.0 5.0 - 8.0   Glucose, UA NEGATIVE NEGATIVE mg/dL   Hgb urine dipstick NEGATIVE NEGATIVE   Bilirubin Urine NEGATIVE NEGATIVE   Ketones, ur NEGATIVE NEGATIVE mg/dL   Protein, ur NEGATIVE NEGATIVE mg/dL   Nitrite NEGATIVE NEGATIVE   Leukocytes,Ua NEGATIVE NEGATIVE    Comment: Performed at [facility], [address], [place], [place] [address]  Ethanol     Status: None   Collection Time: 03/21/19  3:02 AM  Result Value Ref Range   Alcohol, Ethyl (B) <10 <10 mg/dL    Comment: (NOTE) Lowest detectable limit for serum alcohol is 10 mg/dL. For medical purposes only. Performed at [facility], [address], [place], [place] [address]   Protime-INR     Status: None   Collection Time: 03/21/19  3:02 AM  Result Value Ref Range   Prothrombin Time 12.2 11.4 - 15.2 seconds   INR 0.9 0.8 - 1.2    Comment: (NOTE) INR goal varies based on device and disease states. Performed at [facility], [address], [place], [place] [address]   APTT     Status: None   Collection Time: 03/21/19  3:02 AM  Result Value Ref Range   aPTT 26 24 - 36 seconds    Comment: Performed at [facility], [address] [place], [place] [address]  CBC     Status: None   Collection Time: 03/21/19  3:02 AM  Result Value Ref Range   WBC 8.9 4.0 - 10.5 K/uL   RBC 4.48 3.87 - 5.11 MIL/uL   Hemoglobin 12.3 12.0 - 15.0 g/dL   HCT 13.0 86.5 - 78.4 %   MCV 86.4 80.0 - 100.0 fL   MCH 27.5 26.0 - 34.0 pg   MCHC 31.8 30.0 - 36.0 g/dL   RDW 69.6 29.5 - 28.4 %   Platelets 240 150 - 400 K/uL   nRBC 0.0 0.0 - 0.2 %    Comment: Performed at [facility], [address], [place], [place] [address]  Differential     Status: None   Collection Time: 03/21/19  3:02 AM  Result Value Ref Range   Neutrophils Relative % 77 %   Neutro Abs 6.8 1.7 - 7.7 K/uL   Lymphocytes Relative 18 %   Lymphs Abs 1.6 0.7 - 4.0 K/uL   Monocytes Relative 4 %   Monocytes Absolute 0.4 0.1 - 1.0 K/uL   Eosinophils Relative 1 %   Eosinophils Absolute 0.1 0.0 - 0.5 K/uL   Basophils Relative 0 %   Basophils Absolute 0.0 0.0 - 0.1 K/uL   Immature Granulocytes 0 %   Abs Immature Granulocytes 0.02 0.00 - 0.07 K/uL    Comment: Performed at [facility], [address] [place], [place] [address]  Comprehensive metabolic panel     Status:  Abnormal   Collection Time:  03/21/19  3:02 AM  Result Value Ref Range   Sodium 140 135 - 145 mmol/L   Potassium 3.5 3.5 - 5.1 mmol/L   Chloride 105 98 - 111 mmol/L   CO2 26 22 - 32 mmol/L   Glucose, Bld 114 (H) 70 - 99 mg/dL   BUN 14 6 - 20 mg/dL   Creatinine, Ser 1.61 0.44 - 1.00 mg/dL   Calcium 9.0 8.9 - 09.6 mg/dL   Total Protein 6.9 6.5 - 8.1 g/dL   Albumin 3.9 3.5 - 5.0 g/dL   AST 14 (L) 15 - 41 U/L   ALT 18 0 - 44 U/L   Alkaline Phosphatase 72 38 - 126 U/L   Total Bilirubin 0.6 0.3 - 1.2 mg/dL   GFR calc non Af Amer >60 >60 mL/min   GFR calc Af Amer >60 >60 mL/min   Anion gap 9 5 - 15    Comment: Performed at [facility], [address], [place], [place] [address]  SARS CORONAVIRUS 2 (TAT 6-24 HRS) Nasopharyngeal Nasopharyngeal Swab     Status: None   Collection Time: 03/21/19  5:00 AM   Specimen: Nasopharyngeal Swab  Result Value Ref Range   SARS Coronavirus 2 NEGATIVE NEGATIVE    Comment: (NOTE) SARS-CoV-2 target nucleic acids are NOT DETECTED. The SARS-CoV-2 RNA is generally detectable in upper and lower respiratory specimens during the acute phase of infection. Negative results do not preclude SARS-CoV-2 infection, do not rule out co-infections with other pathogens, and should not be used as the sole basis for treatment or other patient management decisions. Negative results must be combined with clinical observations, patient history, and epidemiological information. The expected result is Negative. Fact Sheet for Patients: HairSlick.no Fact Sheet for Healthcare Providers: quierodirigir.com This test is not yet approved or cleared by the [place] FDA and  has been authorized for detection and/or diagnosis of SARS-CoV-2 by FDA under an Emergency Use Authorization (EUA). This EUA will remain  in effect (meaning this test can be used) for the duration of the COVID-19 declaration under Section 56 4(b)(1) of the Act, 21 U.S.C. section  360bbb-3(b)(1), unless the authorization is terminated or revoked sooner. Performed at [facility], [address] [place], [place] [address]   HIV Antibody (routine testing w rflx)     Status: None   Collection Time: 03/21/19  5:44 AM  Result Value Ref Range   HIV Screen 4th Generation wRfx NON REACTIVE NON REACTIVE    Comment: Performed at [facility], [address] [place], [place] [address]  Hemoglobin A1c     Status: None   Collection Time: 03/21/19  5:44 AM  Result Value Ref Range   Hgb A1c MFr Bld 5.6 4.8 - 5.6 %    Comment: (NOTE) Pre diabetes:          5.7%-6.4% Diabetes:              >6.4% Glycemic control for   <7.0% adults with diabetes    Mean Plasma Glucose 114.02 mg/dL    Comment: Performed at [facility], [address] [place], [place] [address]  Lipid panel     Status: Abnormal   Collection Time: 03/21/19  5:44 AM  Result Value Ref Range   Cholesterol 204 (H) 0 - 200 mg/dL   Triglycerides 308 (H) <150 mg/dL   HDL 25 (L) >65 mg/dL   Total CHOL/HDL Ratio 8.2 RATIO  VLDL 37 0 - 40 mg/dL   LDL Cholesterol 142 (H) 0 - 99 mg/dL    Comment:        Total Cholesterol/HDL:CHD Risk Coronary Heart Disease Risk Table                     Men   Women  1/2 Average Risk   3.4   3.3  Average Risk       5.0   4.4  2 X Average Risk   9.6   7.1  3 X Average Risk  23.4   11.0        Use the calculated Patient Ratio above and the CHD Risk Table to determine the patient's CHD Risk.        ATP III CLASSIFICATION (LDL):  <100     mg/dL   Optimal  100-129  mg/dL   Near or Above                    Optimal  130-159  mg/dL   Borderline  160-189  mg/dL   High  >190     mg/dL   Very High Performed at [facility], [address], [place], [place] [address]   TSH     Status: None   Collection Time: 03/21/19  5:44 AM  Result Value Ref Range   TSH 1.654 0.350 - 4.500 uIU/mL    Comment: Performed by a 3rd Generation assay with a functional sensitivity  of <=0.01 uIU/mL. Performed at [facility], [address] [place], [place] [address]   Sedimentation rate     Status: None   Collection Time: 03/21/19  5:44 AM  Result Value Ref Range   Sed Rate 8 0 - 22 mm/hr    Comment: Performed at [facility], [address], [place], [place] [address]  RPR     Status: None   Collection Time: 03/21/19  5:44 AM  Result Value Ref Range   RPR Ser Ql NON REACTIVE NON REACTIVE    Comment: Performed at [facility] [address] [place], [place] [address]  Vitamin B12     Status: Abnormal   Collection Time: 03/21/19  8:21 AM  Result Value Ref Range   Vitamin B-12 105 (L) 180 - 914 pg/mL    Comment: (NOTE) This assay is not validated for testing neonatal or myeloproliferative syndrome specimens for Vitamin B12 levels. Performed at [facility], [address], [place], [place] [address]   Ammonia     Status: None   Collection Time: 03/21/19  8:21 AM  Result Value Ref Range   Ammonia 21 9 - 35 umol/L    Comment: Performed at [facility], [address] [place], [place] [address]  Folate     Status: None   Collection Time: 03/21/19  8:21 AM  Result Value Ref Range   Folate 7.4 >5.9 ng/mL    Comment: Performed at [facility], [address], [place],  [address]   CT ABDOMEN PELVIS WO CONTRAST  Result Date: 03/21/2019 CLINICAL DATA:  Right-sided abdominal pain. Cholecystectomy in June 2020. Nausea and vomiting. EXAM: CT ABDOMEN AND PELVIS WITHOUT CONTRAST TECHNIQUE: Multidetector CT imaging of the abdomen and pelvis was performed following the standard protocol without IV contrast. COMPARISON:  02/28/2019 ultrasound of 03/21/2019 FINDINGS: Lower chest: Airway thickening is present, suggesting bronchitis or reactive airways disease. Subsegmental atelectasis in both lower lobes. Hepatobiliary: Cholecystectomy. Previous hypodensity along the anterior aspect of  segment 5 of the liver is markedly reduced in size, currently only about 0.4 cm  in diameter, previously 1.4 cm in diameter, likely a small residua from prior cholecystectomy. Pancreas: Unremarkable Spleen: Unremarkable Adrenals/Urinary Tract: 2 mm left mid kidney nonobstructive renal calculus on image 35/5. Otherwise unremarkable. Stomach/Bowel: Upper normal size of the proximal appendix at 6 mm in diameter, with completely normal appearing distal appendix, this appearance is probably incidental. Vascular/Lymphatic: Aortoiliac atherosclerotic vascular disease. Reproductive: Unremarkable Other: No supplemental non-categorized findings. Musculoskeletal: Facet arthropathy and degenerative disc disease at L4-5 likely resulting in bilateral mild foraminal impingement. Small umbilical hernia contains adipose tissue, images 72-73 of series 6. IMPRESSION: 1. A cause for the patient's right-sided abdominal pain is not identified. 2. Airway thickening is present, suggesting bronchitis or reactive airways disease. 3. Other imaging findings of potential clinical significance: Nonobstructive left nephrolithiasis. Small umbilical hernia contains adipose tissue. Lower lumbar spondylosis and degenerative disc disease causing mild foraminal impingement at L4-5. Aortic Atherosclerosis (ICD10-I70.0). Electronically Signed   By: Gaylyn Rong M.D.   On: 03/21/2019 12:00   DG Chest 2 View  Result Date: 03/21/2019 CLINICAL DATA:  Unresponsive with slurred speech EXAM: CHEST - 2 VIEW COMPARISON:  09/16/2018 FINDINGS: Artifact from EKG leads. There is no edema, consolidation, effusion, or pneumothorax. Normal heart size and mediastinal contours. IMPRESSION: Negative chest. Electronically Signed   By: Marnee Spring M.D.   On: 03/21/2019 06:29   MR ANGIO HEAD WO CONTRAST  Result Date: 03/21/2019 CLINICAL DATA:  48 year old female with altered mental status and right arm weakness. Code stroke presentation, initially unresponsive. EXAM: MRA HEAD WITHOUT CONTRAST TECHNIQUE: Angiographic images of the  Circle of Willis were obtained using MRA technique without intravenous contrast. COMPARISON:  Brain MRI today reported separately. FINDINGS: Mild motion artifact. Antegrade flow in the posterior circulation with codominant distal vertebral arteries. No distal vertebral stenosis. The left PICA origin is patent. The right PICA is patent but its origin is not included. Patent vertebrobasilar junction and basilar artery without stenosis. Normal SCA and PCA origins. Small left posterior communicating artery, the right is diminutive or absent. Bilateral PCA branches are within normal limits. Antegrade flow in both ICA siphons with some ICA tortuosity just below the skull base. No siphon stenosis. Ophthalmic and left posterior communicating artery origins appear normal. Patent carotid termini with normal MCA and ACA origins. Anterior communicating artery is normal. Visible ACA branches are normal aside from mild tortuosity. Left MCA M1 and bifurcation are normal. Visible left MCA branches are within normal limits. Right MCA M1 segment bifurcates early. Bifurcation and visible right MCA branches are within normal limits. IMPRESSION: Negative intracranial MRA. Electronically Signed   By: Odessa Fleming M.D.   On: 03/21/2019 11:09   MR BRAIN WO CONTRAST  Result Date: 03/21/2019 CLINICAL DATA:  48 year old female with altered mental status and right arm weakness. Code stroke presentation, initially unresponsive. EXAM: MRI HEAD WITHOUT CONTRAST TECHNIQUE: Multiplanar, multiecho pulse sequences of the brain and surrounding structures were obtained without intravenous contrast. COMPARISON:  Head CT 0147 hours today. FINDINGS: Brain: Small asymmetric left choroid plexus cyst, normal variant. No restricted diffusion to suggest acute infarction. No midline shift, mass effect, evidence of mass lesion, ventriculomegaly, extra-axial collection or acute intracranial hemorrhage. Cervicomedullary junction and pituitary are within normal  limits. Wallace Cullens and white matter signal is within normal limits for age throughout the brain. No cortical encephalomalacia or chronic cerebral blood products identified. The deep gray nuclei, brainstem and cerebellum appear normal. Vascular: Major  intracranial vascular flow voids are preserved. Skull and upper cervical spine: Negative visible cervical spine. Visualized bone marrow signal is within normal limits. Sinuses/Orbits: Negative orbits. Paranasal sinuses remain clear. Other: Trace mastoid fluid. Negative nasopharynx. Grossly negative visible internal auditory structures. Scalp and face soft tissues appear negative. IMPRESSION: No acute intracranial abnormality. Normal for age non-contrast MRI appearance of the brain. Electronically Signed   By: Odessa Fleming M.D.   On: 03/21/2019 11:00   US Carotid Bilateral  Result Date: 03/21/2019 CLINICAL DATA:  48 year old female with right-sided weakness and numbness, sudden onset EXAM: BILATERAL CAROTID DUPLEX ULTRASOUND TECHNIQUE: Wallace Cullens scale imaging, color Doppler and duplex ultrasound were performed of bilateral carotid and vertebral arteries in the neck. COMPARISON:  None. FINDINGS: Criteria: Quantification of carotid stenosis is based on velocity parameters that correlate the residual internal carotid diameter with NASCET-based stenosis levels, using the diameter of the distal internal carotid lumen as the denominator for stenosis measurement. The following velocity measurements were obtained: RIGHT ICA: 118/46 cm/sec CCA: 93/22 cm/sec SYSTOLIC ICA/CCA RATIO:  1.1 ECA:  84 cm/sec LEFT ICA: 101/37 cm/sec CCA: 102/25 cm/sec SYSTOLIC ICA/CCA RATIO:  0.6 ECA:  60 cm/sec RIGHT CAROTID ARTERY: No significant atherosclerotic plaque or evidence of stenosis in the internal carotid artery. RIGHT VERTEBRAL ARTERY:  Patent with normal antegrade flow. LEFT CAROTID ARTERY: No significant atherosclerotic plaque or evidence of stenosis in the internal carotid artery. LEFT VERTEBRAL  ARTERY:  Patent with normal antegrade flow. IMPRESSION: 1. No significant stenosis or atherosclerotic plaque in either internal carotid artery. 2. The vertebral arteries are patent with normal antegrade flow. Signed, Sterling Big, MD, RPVI [facility] Electronically Signed   By: Malachy Moan M.D.   On: 03/21/2019 09:50   CT HEAD CODE STROKE WO CONTRAST  Result Date: 03/21/2019 CLINICAL DATA:  Code stroke. EXAM: CT HEAD WITHOUT CONTRAST TECHNIQUE: Contiguous axial images were obtained from the base of the skull through the vertex without intravenous contrast. COMPARISON:  None. FINDINGS: Brain: There is no mass, hemorrhage or extra-axial collection. The size and configuration of the ventricles and extra-axial CSF spaces are normal. The brain parenchyma is normal, without evidence of acute or chronic infarction. Vascular: No abnormal hyperdensity of the major intracranial arteries or dural venous sinuses. No intracranial atherosclerosis. Skull: The visualized skull base, calvarium and extracranial soft tissues are normal. Sinuses/Orbits: No fluid levels or advanced mucosal thickening of the visualized paranasal sinuses. No mastoid or middle ear effusion. The orbits are normal. ASPECTS [facility] Stroke Program Early CT Score) - Ganglionic level infarction (caudate, lentiform nuclei, internal capsule, insula, M1-M3 cortex): 7 - Supraganglionic infarction (M4-M6 cortex): 3 Total score (0-10 with 10 being normal): 10 IMPRESSION: 1. Normal head CT 2. ASPECTS is 10 These results were called by telephone at the time of interpretation on 03/21/2019 at 2:03 am to provider [facility] , who verbally acknowledged these results. Electronically Signed   By: Deatra Robinson M.D.   On: 03/21/2019 02:08   US Abdomen Limited RUQ  Result Date: 03/21/2019 CLINICAL DATA:  Upper abdominal pain EXAM: ULTRASOUND ABDOMEN LIMITED RIGHT UPPER QUADRANT COMPARISON:   CT abdomen and pelvis October 28, 2018 FINDINGS: Gallbladder: Surgically absent. Common bile duct: Diameter: 6 mm. No intrahepatic or extrahepatic biliary duct dilatation. Liver: No focal lesion identified. Liver echogenicity overall is increased. Portal vein is patent on color Doppler imaging with normal direction of blood flow towards the liver. Other: None. IMPRESSION: 1. Diffuse increase in liver echogenicity,  a finding indicative of hepatic steatosis. While no focal liver lesions are evident on this study, it must be cautioned that the sensitivity of ultrasound for detection of focal liver lesions is diminished in this circumstance. Note that apparent cystic area in the anterior segment right lobe of the liver seen on prior CT is not appreciable on this sonographic evaluation. 2.  Gallbladder absent. Electronically Signed   By: Bretta Bang III M.D.   On: 03/21/2019 09:31    Pending Labs Unresulted Labs (From admission, onward)    Start     Ordered   03/22/19 0500  Vitamin B12  Tomorrow morning,   R     03/21/19 0502   03/22/19 0500  Basic metabolic panel  Tomorrow morning,   R    Question:  Specimen collection method  Answer:  Lab=Lab collect   03/21/19 0748   03/22/19 0500  CBC  Tomorrow morning,   R    Question:  Specimen collection method  Answer:  Lab=Lab collect   03/21/19 0748   03/22/19 0500  Magnesium-AM  Tomorrow morning,   R    Question:  Specimen collection method  Answer:  Lab=Lab collect   03/21/19 0748          Vitals/Pain Today's Vitals   03/21/19 1630 03/21/19 1700 03/21/19 1730 03/21/19 1740  BP: 119/77 117/74 123/72   Pulse:    66  Resp: 14 12 14 10   SpO2:    96%  Weight:      Height:      PainSc:        Isolation Precautions No active isolations  Medications Medications   stroke: mapping our early stages of recovery book (has no administration in time range)  acetaminophen (TYLENOL) tablet 650 mg (has no administration in time range)    Or   acetaminophen (TYLENOL) 160 MG/5ML solution 650 mg (has no administration in time range)    Or  acetaminophen (TYLENOL) suppository 650 mg (has no administration in time range)  aspirin suppository 300 mg ( Rectal See Alternative 03/21/19 1138)    Or  aspirin tablet 325 mg (325 mg Oral Given 03/21/19 1138)  atorvastatin (LIPITOR) tablet 80 mg (has no administration in time range)  pantoprazole (PROTONIX) injection 40 mg (40 mg Intravenous Given 03/21/19 1138)  0.9 % NaCl with KCl 20 mEq/ L  infusion ( Intravenous New Bag/Given 03/21/19 1147)  iohexol (OMNIPAQUE) 9 MG/ML oral solution 500 mL (has no administration in time range)  naloxone [facility]) injection 0.4 mg (0.4 mg Intravenous Given 03/21/19 0206)  flumazenil (ROMAZICON) injection 0.2 mg (0.2 mg Intravenous Given 03/21/19 0315)  ketorolac (TORADOL) 30 MG/ML injection 30 mg (30 mg Intravenous Given 03/21/19 0558)  traMADol (ULTRAM) tablet 50 mg (50 mg Oral Given 03/21/19 1603)  oxyCODONE (Oxy IR/ROXICODONE) immediate release tablet 5 mg (5 mg Oral Given 03/21/19 1211)    Mobility walks Low fall risk   Focused Assessments    R Recommendations: See Admitting Provider Note  Report given to:   Additional Notes:

## 2019-03-21 NOTE — Procedures (Signed)
Patient Name: Leslie Lowery  MRN: [id number]  Epilepsy Attending: Lora Havens  Referring Physician/Provider: Dr Shanon Brow Tat Date: 03/21/2019 Duration: 22.19 mins  Patient history: 48yo F with ams. EEG to evaluate for seizure  Level of alertness: awake  AEDs during EEG study: None  Technical aspects: This EEG study was done with scalp electrodes positioned according to the 10-20 International system of electrode placement. Electrical activity was acquired at a sampling rate of 500Hz  and reviewed with a high frequency filter of 70Hz  and a low frequency filter of 1Hz . EEG data were recorded continuously and digitally stored.   DESCRIPTION:The posterior dominant rhythm consists of 8 Hz activity of moderate voltage (25-35 uV) seen predominantly in posterior head regions, symmetric and reactive to eye opening and eye closing. Physiologic photic driving was seen during photic stimulation. Hyperventilation was not performed.  IMPRESSION: This study is within normal limits. No seizures or epileptiform discharges were seen throughout the recording.  Demarques Pilz Barbra Sarks

## 2019-03-21 NOTE — Progress Notes (Signed)
Code stroke  Call time   140am  Ems in route Beeper    None Start    153 End    [place] called   159 am

## 2019-03-21 NOTE — ED Notes (Signed)
Pt called out saying she "can't breathe", nurse to room- pt is tachy @144 , O2 sats are WNL- pt reports she was sleeping and awoke with the feeling "that she could not breathe", pt says this feeling lasted for about 10 seconds, reports this happens sometimes at home when she is asleep and will spontaneously wake up b/c she feels like she can't breathe. Dr Sabra Heck made aware and shown rhythm strip of elevated HR.  Elevated HR lasted about 10-20 seconds. EKG obtained when pt HR had started to come down and was given to Dr Sabra Heck.

## 2019-03-21 NOTE — Consult Note (Signed)
[facility] Tele[facility]rology Consult Services   Date of Service:   03/21/2019 01:57:42  Impression:     .  G93.49 - Encephalopathy Multifactorial  Comments/Sign-Out: 48 F, LKW at midnight, came in for abd pain, dizziness, and spell of LOC. Awoke after getting Narcan on the field, but noted to have some slurred speech and R sided wkness. On initial arrival to ER, she was somnolent, mumbling incoherently, and answering simple ?s. By my assessment, she was back to her nl self with NIHSS 0, very loud and argumentative, moving all 4 ext with full strength. Etiology may be a transient metabolic encephalopathy in the setting of possible pain med use given her response to Narcan. Reassuring now that her exam is nonfocal. Head CT neg for acute abnl.   PLAN  - no further neuro workup needed at this time  - ok to dc home if medically clear and if pt's mentation remains at baseline  --  Metrics: Last Known Well: 03/21/2019 00:00:00 [facility] Notification Time: 03/21/2019 01:57:42 Arrival Time: 03/21/2019 01:47:00 Stamp Time: 03/21/2019 01:57:42 Time First Login Attempt: 03/21/2019 02:00:46 Video Start Time: 03/21/2019 02:00:46  Symptoms: syncope, ?slurred speech NIHSS Start Assessment Time: 03/21/2019 02:05:16 Patient is not a candidate for Alteplase/Activase. Patient was not deemed candidate for Alteplase/Activase thrombolytics because of Resolved symptoms (no residual disabling symptoms).  CT head showed no acute hemorrhage or acute core infarct.  Clinical Presentation is not Suggestive of Large Vessel Occlusive Disease  Sign Out:     .  Discussed with Emergency Department Provider  ------------------------------------------------------------------------------  History of Present Illness: Patient is a 48 year old Female.  Patient was brought by EMS for symptoms of syncope, ?slurred speech  48 F, h/o GERD, chronic pain, who was LKW at midnight, last seen by  family. At 12:45 AM, pt reported that she was having significant abd pain and felt dizzy and faint. She said she called out to her family and then passed out for "a few mins." Family checked in on her and pt was essentially unresponsive. EMS called. Pt awoke after she was given Narcan, but was still found to have slurred speech, minimal verbal outpt and some ?R arm wkness. Stroke alert activated. NIHSS 0 on my eval. By her arrival to ED, pt is awake and quite argumentative. She denies having taken any drugs earlier tonight. She is otherwise alert and oriented, but prefers to keep her eyes closed bc the light is 'hurting her eyes.' She is full strength with normal sensation. Speech and language is normal.    Examination: 1A: Level of Consciousness - Alert; keenly responsive + 0 1B: Ask Month and Age - Both Questions Right + 0 1C: Blink Eyes & Squeeze Hands - Performs Both Tasks + 0 2: Test Horizontal Extraocular Movements - Normal + 0 3: Test Visual Fields - No Visual Loss + 0 4: Test Facial Palsy (Use Grimace if Obtunded) - Normal symmetry + 0 5A: Test Left Arm Motor Drift - No Drift for 10 Seconds + 0 5B: Test Right Arm Motor Drift - No Drift for 10 Seconds + 0 6A: Test Left Leg Motor Drift - No Drift for 5 Seconds + 0 6B: Test Right Leg Motor Drift - No Drift for 5 Seconds + 0 7: Test Limb Ataxia (FNF/Heel-Shin) - No Ataxia + 0 8: Test Sensation - Normal; No sensory loss + 0 9: Test Language/Aphasia - Normal; No aphasia + 0 10: Test Dysarthria - Normal + 0 11: Test Extinction/Inattention - No  abnormality + 0  NIHSS Score: 0  Pre-Morbid Modified Ranking Scale: 0 Points = No symptoms at all   Patient/Family was informed the Neurology Consult would happen via TeleHealth consult by way of interactive audio and video telecommunications and consented to receiving care in this manner.   Due to the immediate potential for life-threatening deterioration due to underlying acute neurologic  illness, I spent 25 minutes providing critical care. This time includes time for face to face visit via telemedicine, review of medical records, imaging studies and discussion of findings with providers, the patient and/or family.   Dr Burtis Junes   [facility] [phone]   Case [id number]

## 2019-03-21 NOTE — Progress Notes (Signed)
EEG Completed; Results Pending  

## 2019-03-22 DIAGNOSIS — G8191 Hemiplegia, unspecified affecting right dominant side: Secondary | ICD-10-CM | POA: Diagnosis not present

## 2019-03-22 DIAGNOSIS — G9341 Metabolic encephalopathy: Secondary | ICD-10-CM | POA: Diagnosis not present

## 2019-03-22 DIAGNOSIS — Z720 Tobacco use: Secondary | ICD-10-CM | POA: Diagnosis not present

## 2019-03-22 DIAGNOSIS — G894 Chronic pain syndrome: Secondary | ICD-10-CM | POA: Diagnosis not present

## 2019-03-22 LAB — BASIC METABOLIC PANEL
Anion gap: 11 (ref 5–15)
BUN: 9 mg/dL (ref 6–20)
CO2: 26 mmol/L (ref 22–32)
Calcium: 8.5 mg/dL — ABNORMAL LOW (ref 8.9–10.3)
Chloride: 105 mmol/L (ref 98–111)
Creatinine, Ser: 0.74 mg/dL (ref 0.44–1.00)
GFR calc Af Amer: >60 mL/min (ref >60)
GFR calc non Af Amer: >60 mL/min (ref >60)
Glucose, Bld: 89 mg/dL (ref 70–99)
Potassium: 3.6 mmol/L (ref 3.5–5.1)
Sodium: 142 mmol/L (ref 135–145)

## 2019-03-22 LAB — CBC
HCT: 36.5 % (ref 36.0–46.0)
Hemoglobin: 11.7 g/dL — ABNORMAL LOW (ref 12.0–15.0)
MCH: 27.8 pg (ref 26.0–34.0)
MCHC: 32.1 g/dL (ref 30.0–36.0)
MCV: 86.7 fL (ref 80.0–100.0)
Platelets: 235 10*3/uL (ref 150–400)
RBC: 4.21 MIL/uL (ref 3.87–5.11)
RDW: 14.1 % (ref 11.5–15.5)
WBC: 5.7 10*3/uL (ref 4.0–10.5)
nRBC: 0 % (ref 0.0–0.2)

## 2019-03-22 LAB — MAGNESIUM: Magnesium: 1.9 mg/dL (ref 1.7–2.4)

## 2019-03-22 LAB — VITAMIN B12: Vitamin B-12: 107 pg/mL — ABNORMAL LOW (ref 180–914)

## 2019-03-22 MED ORDER — CYANOCOBALAMIN 1000 MCG/ML IJ SOLN
1000.0000 ug | Freq: Once | INTRAMUSCULAR | Status: AC
  Administered 2019-03-22: 1000 ug via INTRAMUSCULAR
  Filled 2019-03-22: qty 1

## 2019-03-22 MED ORDER — VITAMIN B-12 100 MCG PO TABS: 500.0000 ug | ORAL_TABLET | Freq: Every day | ORAL | Status: DC

## 2019-03-22 MED ORDER — CYANOCOBALAMIN 500 MCG PO TABS: 500.0000 ug | ORAL_TABLET | Freq: Every day | ORAL | Status: AC

## 2019-03-22 NOTE — Progress Notes (Signed)
Patient given stroke education book.  Explained signs and symptoms of stroke and the importance of seeking off emergent medical attention if symptoms occur again.  Patient verbalized understanding.

## 2019-03-22 NOTE — Evaluation (Signed)
Physical Therapy Evaluation Patient Details Name: Leslie Lowery MRN: [id number] DOB: 08/20/1970 Today's Date: 03/22/2019   History of Present Illness  Leslie Lowery  is a 48 y.o. female,  w hypertension, chronic cholecystitis,  anxiety, apparently was found unresponsive by EMS, unable to move right side?.  Pt was given narcan x2 en route.  Pt seemed to respond to narcan ?Marland Kitchen  Pt is a poor historian and keeps changing her story.  Presently she states that her right arm and face were numb starting around 10pm.  Pt notes doing marijuana about 6pm.  Pt has had intermittent blurred vision and headache and notes that her bp has been high due to not taking bp medication and this typically causes her headache.    Clinical Impression  Patient functioning near baseline for functional mobility and gait, other than having to occasionally lean on side rail in hallways during ambulation, no loss of balance, demonstrates good return for going up down steps in stairwell and tolerated sitting up in chair after therapy.  Plan:  Patient discharged from physical therapy to care of nursing for ambulation daily as tolerated for length of stay.     Follow Up Recommendations No PT follow up    Equipment Recommendations  None recommended by PT    Recommendations for Other Services       Precautions / Restrictions Precautions Precautions: None Restrictions Weight Bearing Restrictions: No      Mobility  Bed Mobility Overal bed mobility: Independent                Transfers Overall transfer level: Modified independent               General transfer comment: increased time  Ambulation/Gait Ambulation/Gait assistance: Modified independent (Device/Increase time) Gait Distance (Feet): 100 Feet Assistive device: None Gait Pattern/deviations: Decreased step length - right;Decreased step length - left;Decreased stride length Gait velocity: decreased   General Gait Details: slightly labored  cadence with occasional use of side rail in hallways, no loss of balance, c/o mild dizziness that resolved after 3-4 minutes  Stairs Stairs: Yes Stairs assistance: Modified independent (Device/Increase time) Stair Management: One rail Right;One rail Left;Alternating pattern;Step to pattern Number of Stairs: 5 General stair comments: demonstrates good return for going up/down steps using 1 siderail with alternating pattern going up, step to pattern comming down without loss of balance  Wheelchair Mobility    Modified Rankin (Stroke Patients Only)       Balance Overall balance assessment: Mild deficits observed, not formally tested                                           Pertinent Vitals/Pain Pain Assessment: 0-10 Pain Score: 5  Pain Location: left side Pain Descriptors / Indicators: Sore Pain Intervention(s): Limited activity within patient's tolerance;Monitored during session    Home Living Family/patient expects to be discharged to:: Private residence Living Arrangements: Children Available Help at Discharge: Family;Available PRN/intermittently Type of Home: House Home Access: Stairs to enter Entrance Stairs-Rails: Right Entrance Stairs-Number of Steps: 3 Home Layout: One level Home Equipment: None      Prior Function Level of Independence: Independent         Comments: Tourist information centre manager, drives     Hand Dominance   Dominant Hand: Right    Extremity/Trunk Assessment   Upper Extremity Assessment Upper Extremity Assessment: Defer to OT  evaluation    Lower Extremity Assessment Lower Extremity Assessment: Overall WFL for tasks assessed    Cervical / Trunk Assessment Cervical / Trunk Assessment: Normal  Communication   Communication: No difficulties  Cognition Arousal/Alertness: Awake/alert Behavior During Therapy: WFL for tasks assessed/performed Overall Cognitive Status: Within Functional Limits for tasks assessed                                         General Comments      Exercises     Assessment/Plan    PT Assessment Patent does not need any further PT services  PT Problem List         PT Treatment Interventions      PT Goals (Current goals can be found in the Care Plan section)  Acute Rehab PT Goals Patient Stated Goal: return home PT Goal Formulation: With patient Time For Goal Achievement: 03/22/19 Potential to Achieve Goals: Good    Frequency     Barriers to discharge        Co-evaluation               AM-PAC PT "6 Clicks" Mobility  Outcome Measure Help needed turning from your back to your side while in a flat bed without using bedrails?: None Help needed moving from lying on your back to sitting on the side of a flat bed without using bedrails?: None Help needed moving to and from a bed to a chair (including a wheelchair)?: None Help needed standing up from a chair using your arms (e.g., wheelchair or bedside chair)?: None Help needed to walk in hospital room?: None Help needed climbing 3-5 steps with a railing? : A Little 6 Click Score: 23    End of Session   Activity Tolerance: Patient tolerated treatment well Patient left: in chair;with call bell/phone within reach Nurse Communication: Mobility status PT Visit Diagnosis: Unsteadiness on feet (R26.81);Other abnormalities of gait and mobility (R26.89);Muscle weakness (generalized) (M62.81)    Time: 3557-3220 PT Time Calculation (min) (ACUTE ONLY): 21 min   Charges:   PT Evaluation $PT Eval Moderate Complexity: 1 Mod PT Treatments $Therapeutic Activity: 8-22 mins        8:38 AM, 03/22/19 Lonell Grandchild, MPT Physical Therapist with [facility] [phone] office [phone] mobile phone

## 2019-03-22 NOTE — Discharge Summary (Signed)
Physician Discharge Summary  Quentin MullingKimberly D Mini VOZ:[id number] DOB: October 13, 1970 DOA: 03/21/2019  PCP: Patient, No Pcp Per  Admit date: 03/21/2019 Discharge date: 03/22/2019  Admitted From: Home Disposition:  Home   Recommendations for Outpatient Follow-up:  1. Follow up with PCP in 1-2 weeks 2. Please obtain BMP/CBC in one week   Discharge Condition: Stable CODE STATUS:FULL Diet recommendation:  Regular   Brief/Interim Summary: 48 year old female with history of anxiety/depression, hypertension, tobacco abuse, chronic cholecystitis status post laparoscopic cholecystectomy 09/07/2018, NASH presenting when she was found unresponsive by her family at her home.  The patient is a difficult historian as she is quite tangential.  Nevertheless, EMS gave the patient Narcan x2 after which the patient was more alert.  However, the patient was noted to have slurred speech and right upper extremity weakness.  According to the patient, she stated that on the evening of 03/20/2019, she was sitting on her couch and reaching for cigarettes and having difficulty using her right arm.  She had an associated headache with this episode.  She stated after that she lost consciousness and did not remember anything else except waking up an ambulance.  She had denied any fevers, chills, chest pain, vomiting, diarrhea, dysuria, hematochezia, melena.  She states that she has had abdominal pain since her cholecystectomy, but states that it has been worse in the past week mostly isolated in the right upper quadrant.  She stated that she went to an outside hospital 2 days prior to this admission.  Apparently a CT scan was done, but she does not know the results.  She was discharged home from the emergency department at the outside hospital.  She states that she uses chronic oxycodone and Adderall.  Her oxycodone is for her chronic back pain.  She states that for the past week she has been using over-the-counter ibuprofen on a  daily basis taking approximately 6 tablets daily.  In the emergency department, the patient was initially lethargic, but with stimulation she would wake up.  There was some concern initially of some right upper extremity weakness and slurred speech as the patient was mumbling.  Initial code stroke was called.  CT of the brain was negative.  However upon evaluation by EDP, the patient was awakened and follow commands and moves all 4 extremities.  The patient denies taking any extra oxycodone or other illicit substances.   The patient was afebrile hemodynamically stable saturating 96,008% on room air.  BMP, LFTs, and CBC were essentially unremarkable.  Discharge Diagnoses:  Right upper extremity weakness/hemiparesis/slurred speech -Suspect this is secondary to opioid overuse vs seizure [facility] -Neurology consult noted--Medication effect his suspected to be the most likely etiology;  No AEDs recommended at this time -Initial concern for seizure and Todd's paralysis -CT brain negative -MRI brain--normal for age -EEG--neg for epileptiform discharges -Urine drug screen positive for amphetamine, opiates, THC -oxycodone does not cause positive opiate UDS -Continue aspirin for now -Personally reviewed EKG--sinus rhythm, RSR prime, nonspecific T wave change -12/21 echo--EF 65-70%, trivial TR -12/21 carotid US--neg  Acute metabolic/toxic encephalopathy -Likely secondary to opioid overuse and possible illicit substance -Urinalysis negative for pyuria -Patient now is more alert and back to baseline at the time -Initial consideration given to seizure with Todd's paralysis -Further work-up of patient does not continue to improve -Personally reviewed EKG--sinus rhythm, RSR prime, nonspecific T wave change -folate 7.4 -B12--105 -ammonia 21 -RPR--neg -TSH 1.654  Low B12 -replete  Chronic abdominal pain -CT abdomen pelvis--s/p cholecystectomy,  no acute findings; Airway thickening  is present, suggesting bronchitis or reactive airways disease.  Nonobstructive left nephrolithiasis. Small umbilical hernia contains adipose tissue. Lower lumbar spondylosis and degenerative disc disease causing mild foraminal impingement at L4-5 -12/21 RUQ US--Diffuse increase in liver echogenicity, a finding indicative of hepatic steatosis.  S/p cholecystectomy -Patient may have a degree of peptic ulcer disease related to NSAID use -advanced diet which patient tolerated -started PPI -started IVF with KCl -?component of IBS  Elevated blood pressure -Patient is not on any agents in the outpatient setting  Hyperlipidemia -Continue statin  Chronic pain syndrome -I have reviewed PMP Aware--pt receives monthly oxycodone 5 mg #120 and dextroamphetamine #30  Tobacco abuse -Tobacco cessation discussed I have discussed tobacco cessation with the patient.  I have counseled the patient regarding the negative impacts of continued tobacco use including but not limited to lung cancer, COPD, and cardiovascular disease.  I have discussed alternatives to tobacco and modalities that may help facilitate tobacco cessation including but not limited to biofeedback, hypnosis, and medications.  Total time spent with tobacco counseling was 4 minutes.   THC abuse -cessation discussed  Discharge Instructions   Allergies as of 03/22/2019   No Known Allergies     Medication List    TAKE these medications   albuterol 108 (90 Base) MCG/ACT inhaler Commonly known as: VENTOLIN HFA Inhale 1-2 puffs into the lungs every 6 (six) hours as needed for wheezing or shortness of breath.   amphetamine-dextroamphetamine 20 MG tablet Commonly known as: ADDERALL Take 20 mg by mouth daily.   oxyCODONE 5 MG immediate release tablet Commonly known as: Oxy IR/ROXICODONE Take 5 mg by mouth 4 (four) times daily.   vitamin B-12 500 MCG tablet Commonly known as: CYANOCOBALAMIN Take 1 tablet (500 mcg total) by  mouth daily. Start taking on: March 23, 2019       No Known Allergies  Consultations:  neurology   Procedures/Studies: CT ABDOMEN PELVIS WO CONTRAST  Result Date: 03/21/2019 CLINICAL DATA:  Right-sided abdominal pain. Cholecystectomy in June 2020. Nausea and vomiting. EXAM: CT ABDOMEN AND PELVIS WITHOUT CONTRAST TECHNIQUE: Multidetector CT imaging of the abdomen and pelvis was performed following the standard protocol without IV contrast. COMPARISON:  02/28/2019 ultrasound of 03/21/2019 FINDINGS: Lower chest: Airway thickening is present, suggesting bronchitis or reactive airways disease. Subsegmental atelectasis in both lower lobes. Hepatobiliary: Cholecystectomy. Previous hypodensity along the anterior aspect of segment 5 of the liver is markedly reduced in size, currently only about 0.4 cm in diameter, previously 1.4 cm in diameter, likely a small residua from prior cholecystectomy. Pancreas: Unremarkable Spleen: Unremarkable Adrenals/Urinary Tract: 2 mm left mid kidney nonobstructive renal calculus on image 35/5. Otherwise unremarkable. Stomach/Bowel: Upper normal size of the proximal appendix at 6 mm in diameter, with completely normal appearing distal appendix, this appearance is probably incidental. Vascular/Lymphatic: Aortoiliac atherosclerotic vascular disease. Reproductive: Unremarkable Other: No supplemental non-categorized findings. Musculoskeletal: Facet arthropathy and degenerative disc disease at L4-5 likely resulting in bilateral mild foraminal impingement. Small umbilical hernia contains adipose tissue, images 72-73 of series 6. IMPRESSION: 1. A cause for the patient's right-sided abdominal pain is not identified. 2. Airway thickening is present, suggesting bronchitis or reactive airways disease. 3. Other imaging findings of potential clinical significance: Nonobstructive left nephrolithiasis. Small umbilical hernia contains adipose tissue. Lower lumbar spondylosis and  degenerative disc disease causing mild foraminal impingement at L4-5. Aortic Atherosclerosis (ICD10-I70.0). Electronically Signed   By: Gaylyn Rong M.D.   On: 03/21/2019 12:00   DG  Chest 2 View  Result Date: 03/21/2019 CLINICAL DATA:  Unresponsive with slurred speech EXAM: CHEST - 2 VIEW COMPARISON:  09/16/2018 FINDINGS: Artifact from EKG leads. There is no edema, consolidation, effusion, or pneumothorax. Normal heart size and mediastinal contours. IMPRESSION: Negative chest. Electronically Signed   By: Marnee Spring M.D.   On: 03/21/2019 06:29   MR ANGIO HEAD WO CONTRAST  Result Date: 03/21/2019 CLINICAL DATA:  48 year old female with altered mental status and right arm weakness. Code stroke presentation, initially unresponsive. EXAM: MRA HEAD WITHOUT CONTRAST TECHNIQUE: Angiographic images of the Circle of Willis were obtained using MRA technique without intravenous contrast. COMPARISON:  Brain MRI today reported separately. FINDINGS: Mild motion artifact. Antegrade flow in the posterior circulation with codominant distal vertebral arteries. No distal vertebral stenosis. The left PICA origin is patent. The right PICA is patent but its origin is not included. Patent vertebrobasilar junction and basilar artery without stenosis. Normal SCA and PCA origins. Small left posterior communicating artery, the right is diminutive or absent. Bilateral PCA branches are within normal limits. Antegrade flow in both ICA siphons with some ICA tortuosity just below the skull base. No siphon stenosis. Ophthalmic and left posterior communicating artery origins appear normal. Patent carotid termini with normal MCA and ACA origins. Anterior communicating artery is normal. Visible ACA branches are normal aside from mild tortuosity. Left MCA M1 and bifurcation are normal. Visible left MCA branches are within normal limits. Right MCA M1 segment bifurcates early. Bifurcation and visible right MCA branches are within  normal limits. IMPRESSION: Negative intracranial MRA. Electronically Signed   By: Odessa Fleming M.D.   On: 03/21/2019 11:09   MR BRAIN WO CONTRAST  Result Date: 03/21/2019 CLINICAL DATA:  48 year old female with altered mental status and right arm weakness. Code stroke presentation, initially unresponsive. EXAM: MRI HEAD WITHOUT CONTRAST TECHNIQUE: Multiplanar, multiecho pulse sequences of the brain and surrounding structures were obtained without intravenous contrast. COMPARISON:  Head CT 0147 hours today. FINDINGS: Brain: Small asymmetric left choroid plexus cyst, normal variant. No restricted diffusion to suggest acute infarction. No midline shift, mass effect, evidence of mass lesion, ventriculomegaly, extra-axial collection or acute intracranial hemorrhage. Cervicomedullary junction and pituitary are within normal limits. Wallace Cullens and white matter signal is within normal limits for age throughout the brain. No cortical encephalomalacia or chronic cerebral blood products identified. The deep gray nuclei, brainstem and cerebellum appear normal. Vascular: Major intracranial vascular flow voids are preserved. Skull and upper cervical spine: Negative visible cervical spine. Visualized bone marrow signal is within normal limits. Sinuses/Orbits: Negative orbits. Paranasal sinuses remain clear. Other: Trace mastoid fluid. Negative nasopharynx. Grossly negative visible internal auditory structures. Scalp and face soft tissues appear negative. IMPRESSION: No acute intracranial abnormality. Normal for age non-contrast MRI appearance of the brain. Electronically Signed   By: Odessa Fleming M.D.   On: 03/21/2019 11:00   US Carotid Bilateral  Result Date: 03/21/2019 CLINICAL DATA:  48 year old female with right-sided weakness and numbness, sudden onset EXAM: BILATERAL CAROTID DUPLEX ULTRASOUND TECHNIQUE: Wallace Cullens scale imaging, color Doppler and duplex ultrasound were performed of bilateral carotid and vertebral arteries in the neck.  COMPARISON:  None. FINDINGS: Criteria: Quantification of carotid stenosis is based on velocity parameters that correlate the residual internal carotid diameter with NASCET-based stenosis levels, using the diameter of the distal internal carotid lumen as the denominator for stenosis measurement. The following velocity measurements were obtained: RIGHT ICA: 118/46 cm/sec CCA: 93/22 cm/sec SYSTOLIC ICA/CCA RATIO:  1.1 ECA:  84 cm/sec LEFT  ICA: 101/37 cm/sec CCA: 102/25 cm/sec SYSTOLIC ICA/CCA RATIO:  0.6 ECA:  60 cm/sec RIGHT CAROTID ARTERY: No significant atherosclerotic plaque or evidence of stenosis in the internal carotid artery. RIGHT VERTEBRAL ARTERY:  Patent with normal antegrade flow. LEFT CAROTID ARTERY: No significant atherosclerotic plaque or evidence of stenosis in the internal carotid artery. LEFT VERTEBRAL ARTERY:  Patent with normal antegrade flow. IMPRESSION: 1. No significant stenosis or atherosclerotic plaque in either internal carotid artery. 2. The vertebral arteries are patent with normal antegrade flow. Signed, Sterling Big, MD, RPVI [facility] Electronically Signed   By: Malachy Moan M.D.   On: 03/21/2019 09:50   EEG adult  Result Date: 03/21/2019 Charlsie Quest, MD     03/21/2019  6:04 PM Patient Name: TENESHIA HEDEEN MRN: [id number] Epilepsy Attending: Charlsie Quest Referring Physician/Provider: Dr Onalee Hua Denell Cothern Date: 03/21/2019 Duration: 22.19 mins Patient history: 48yo F with ams. EEG to evaluate for seizure Level of alertness: awake AEDs during EEG study: None Technical aspects: This EEG study was done with scalp electrodes positioned according to the 10-20 International system of electrode placement. Electrical activity was acquired at a sampling rate of  and reviewed with a high frequency filter of  and a low frequency filter of . EEG data were recorded continuously and digitally stored.  DESCRIPTION:The posterior dominant rhythm consists of 8 Hz activity of moderate voltage (25-35 uV) seen predominantly in posterior head regions, symmetric and reactive to eye opening and eye closing. Physiologic photic driving was seen during photic stimulation. Hyperventilation was not performed. IMPRESSION: This study is within normal limits. No seizures or epileptiform discharges were seen throughout the recording. Charlsie Quest   ECHOCARDIOGRAM COMPLETE  Result Date: 03/21/2019   ECHOCARDIOGRAM REPORT   Patient Name:   TYKIA MELLONE Date of Exam: 03/21/2019 Medical Rec #:  [id number]         Height:       64.0 in Accession #:    [id number]        Weight:       240.3 lb Date of Birth:  February 17, 1971         BSA:          2.12 m Patient Age:    48 years          BP:           115/72 mmHg Patient Gender: F                 HR:           65 bpm. [facility] Procedure: 2D Echo, Cardiac Doppler and Color Doppler Indications:    TIA  History:        Patient has no prior history of Echocardiogram examinations.                 Signs/Symptoms:Altered Mental Status; Risk Factors:Hypertension                 and Current Smoker.  Sonographer:    Sheralyn Boatman RDCS (AE) Referring Phys: [id number] Pearson Grippe  Sonographer Comments: Suboptimal parasternal window and patient is morbidly obese. Image acquisition challenging due to patient body habitus. IMPRESSIONS  1. Left ventricular ejection fraction, by visual estimation, is 65 to 70%. The left ventricle has normal function. There is no left ventricular hypertrophy.  2. Left ventricular diastolic parameters are indeterminate.  3. The left ventricle has no regional wall motion  abnormalities.  4. Global right ventricle has normal systolic function.The right ventricular size is normal. No increase in right ventricular wall thickness.  5. Left atrial size was normal.  6. Right atrial size was normal.  7. The mitral valve is normal in structure. No evidence of mitral  valve regurgitation.  8. The tricuspid valve is normal in structure. Tricuspid valve regurgitation is trivial.  9. The aortic valve is normal in structure. Aortic valve regurgitation is not visualized. 10. The pulmonic valve was not well visualized. Pulmonic valve regurgitation is trivial. 11. The inferior vena cava is normal in size with greater than 50% respiratory variability, suggesting right atrial pressure of 3 mmHg. 12. The interatrial septum was not assessed. FINDINGS  Left Ventricle: Left ventricular ejection fraction, by visual estimation, is 65 to 70%. The left ventricle has normal function. The left ventricle has no regional wall motion abnormalities. The left ventricular internal cavity size was the left ventricle is normal in size. There is no left ventricular hypertrophy. Left ventricular diastolic parameters are indeterminate. Right Ventricle: The right ventricular size is normal. No increase in right ventricular wall thickness. Global RV systolic function is has normal systolic function. Left Atrium: Left atrial size was normal in size. Right Atrium: Right atrial size was normal in size Pericardium: There is no evidence of pericardial effusion. Mitral Valve: The mitral valve is normal in structure. No evidence of mitral valve regurgitation. Tricuspid Valve: The tricuspid valve is normal in structure. Tricuspid valve regurgitation is trivial. Aortic Valve: The aortic valve is normal in structure. Aortic valve regurgitation is not visualized. Pulmonic Valve: The pulmonic valve was not well visualized. Pulmonic valve regurgitation is trivial. Pulmonic regurgitation is trivial. Aorta: The aortic root is normal in size and structure. Venous: The inferior vena cava is normal in size with greater than 50% respiratory variability, suggesting right atrial pressure of 3 mmHg. IAS/Shunts: The interatrial septum was not assessed.  LEFT VENTRICLE PLAX 2D LVIDd:         3.73 cm       Diastology LVIDs:          2.58 cm       LV e' lateral:   13.40 cm/s LV PW:         1.27 cm       LV E/e' lateral: 7.1 LV IVS:        1.42 cm       LV e' medial:    9.90 cm/s LVOT diam:     1.80 cm       LV E/e' medial:  9.6 LV SV:         35 ml LV SV Index:   15.43 LVOT Area:     2.54 cm  LV Volumes (MOD) LV area d, A2C:    24.10 cm LV area d, A4C:    22.90 cm LV area s, A2C:    10.30 cm LV area s, A4C:    11.10 cm LV major d, A2C:   7.37 cm LV major d, A4C:   7.52 cm LV major s, A2C:   5.48 cm LV major s, A4C:   5.82 cm LV vol d, MOD A2C: 66.6 ml LV vol d, MOD A4C: 59.7 ml LV vol s, MOD A2C: 17.2 ml LV vol s, MOD A4C: 18.4 ml LV SV MOD A2C:     49.4 ml LV SV MOD A4C:     59.7 ml LV SV MOD BP:      45.2  ml RIGHT VENTRICLE            IVC RV S prime:     9.17 cm/s  IVC diam: 1.95 cm TAPSE (M-mode): 2.0 cm LEFT ATRIUM             Index       RIGHT ATRIUM           Index LA diam:        3.40 cm 1.61 cm/m  RA Area:     12.00 cm LA Vol (A2C):   45.8 ml 21.65 ml/m RA Volume:   26.50 ml  12.53 ml/m LA Vol (A4C):   32.4 ml 15.32 ml/m LA Biplane Vol: 38.3 ml 18.11 ml/m  AORTIC VALVE LVOT Vmax:   163.00 cm/s LVOT Vmean:  113.000 cm/s LVOT VTI:    0.363 m  AORTA Ao Root diam: 3.00 cm Ao Asc diam:  3.30 cm MITRAL VALVE MV Area (PHT): 3.72 cm             SHUNTS MV PHT:        59.16 msec           Systemic VTI:  0.36 m MV Decel Time: 204 msec             Systemic Diam: 1.80 cm MV E velocity: 94.60 cm/s 103 cm/s MV A velocity: 73.20 cm/s 70.3 cm/s MV E/A ratio:  1.29       1.5  Dietrich Pates MD Electronically signed by Dietrich Pates MD Signature Date/Time: 03/21/2019/10:39:34 PM    Final    CT HEAD CODE STROKE WO CONTRAST  Result Date: 03/21/2019 CLINICAL DATA:  Code stroke. EXAM: CT HEAD WITHOUT CONTRAST TECHNIQUE: Contiguous axial images were obtained from the base of the skull through the vertex without intravenous contrast. COMPARISON:  None. FINDINGS: Brain: There is no mass, hemorrhage or extra-axial collection. The size and configuration  of the ventricles and extra-axial CSF spaces are normal. The brain parenchyma is normal, without evidence of acute or chronic infarction. Vascular: No abnormal hyperdensity of the major intracranial arteries or dural venous sinuses. No intracranial atherosclerosis. Skull: The visualized skull base, calvarium and extracranial soft tissues are normal. Sinuses/Orbits: No fluid levels or advanced mucosal thickening of the visualized paranasal sinuses. No mastoid or middle ear effusion. The orbits are normal. ASPECTS [facility] Stroke Program Early CT Score) - Ganglionic level infarction (caudate, lentiform nuclei, internal capsule, insula, M1-M3 cortex): 7 - Supraganglionic infarction (M4-M6 cortex): 3 Total score (0-10 with 10 being normal): 10 IMPRESSION: 1. Normal head CT 2. ASPECTS is 10 These results were called by telephone at the time of interpretation on 03/21/2019 at 2:03 am to provider [facility] , who verbally acknowledged these results. Electronically Signed   By: Deatra Robinson M.D.   On: 03/21/2019 02:08   US Abdomen Limited RUQ  Result Date: 03/21/2019 CLINICAL DATA:  Upper abdominal pain EXAM: ULTRASOUND ABDOMEN LIMITED RIGHT UPPER QUADRANT COMPARISON:  CT abdomen and pelvis October 28, 2018 FINDINGS: Gallbladder: Surgically absent. Common bile duct: Diameter: 6 mm. No intrahepatic or extrahepatic biliary duct dilatation. Liver: No focal lesion identified. Liver echogenicity overall is increased. Portal vein is patent on color Doppler imaging with normal direction of blood flow towards the liver. Other: None. IMPRESSION: 1. Diffuse increase in liver echogenicity, a finding indicative of hepatic steatosis. While no focal liver lesions are evident on this study, it must be cautioned that the sensitivity of ultrasound for detection of focal liver lesions is diminished  in this circumstance. Note that apparent cystic area in the anterior segment right lobe of the liver seen on prior CT is not appreciable on  this sonographic evaluation. 2.  Gallbladder absent. Electronically Signed   By: Bretta Bang III M.D.   On: 03/21/2019 09:31        Discharge Exam: Vitals:   03/22/19 0825 03/22/19 0900  BP:  122/72  Pulse:  70  Resp:  16  Temp:  98.2 F (36.8 C)  SpO2: 96% 96%   Vitals:   03/22/19 0150 03/22/19 0526 03/22/19 0825 03/22/19 0900  BP: 105/62 (!) 136/92  122/72  Pulse:  78  70  Resp:  18  16  Temp: 99.3 F (37.4 C) 98 F (36.7 C)  98.2 F (36.8 C)  TempSrc: Oral Oral  Oral  SpO2: 96% 99% 96% 96%  Weight:      Height:        General: Pt is alert, awake, not in acute distress Cardiovascular: RRR, S1/S2 +, no rubs, no gallops Respiratory: CTA bilaterally, no wheezing, no rhonchi Abdominal: Soft, NT, ND, bowel sounds + Extremities: no edema, no cyanosis   The results of significant diagnostics from this hospitalization (including imaging, microbiology, ancillary and laboratory) are listed below for reference.    Significant Diagnostic Studies: CT ABDOMEN PELVIS WO CONTRAST  Result Date: 03/21/2019 CLINICAL DATA:  Right-sided abdominal pain. Cholecystectomy in June 2020. Nausea and vomiting. EXAM: CT ABDOMEN AND PELVIS WITHOUT CONTRAST TECHNIQUE: Multidetector CT imaging of the abdomen and pelvis was performed following the standard protocol without IV contrast. COMPARISON:  02/28/2019 ultrasound of 03/21/2019 FINDINGS: Lower chest: Airway thickening is present, suggesting bronchitis or reactive airways disease. Subsegmental atelectasis in both lower lobes. Hepatobiliary: Cholecystectomy. Previous hypodensity along the anterior aspect of segment 5 of the liver is markedly reduced in size, currently only about 0.4 cm in diameter, previously 1.4 cm in diameter, likely a small residua from prior cholecystectomy. Pancreas: Unremarkable Spleen: Unremarkable Adrenals/Urinary Tract: 2 mm left mid kidney nonobstructive renal calculus on image 35/5. Otherwise unremarkable.  Stomach/Bowel: Upper normal size of the proximal appendix at 6 mm in diameter, with completely normal appearing distal appendix, this appearance is probably incidental. Vascular/Lymphatic: Aortoiliac atherosclerotic vascular disease. Reproductive: Unremarkable Other: No supplemental non-categorized findings. Musculoskeletal: Facet arthropathy and degenerative disc disease at L4-5 likely resulting in bilateral mild foraminal impingement. Small umbilical hernia contains adipose tissue, images 72-73 of series 6. IMPRESSION: 1. A cause for the patient's right-sided abdominal pain is not identified. 2. Airway thickening is present, suggesting bronchitis or reactive airways disease. 3. Other imaging findings of potential clinical significance: Nonobstructive left nephrolithiasis. Small umbilical hernia contains adipose tissue. Lower lumbar spondylosis and degenerative disc disease causing mild foraminal impingement at L4-5. Aortic Atherosclerosis (ICD10-I70.0). Electronically Signed   By: Gaylyn Rong M.D.   On: 03/21/2019 12:00   DG Chest 2 View  Result Date: 03/21/2019 CLINICAL DATA:  Unresponsive with slurred speech EXAM: CHEST - 2 VIEW COMPARISON:  09/16/2018 FINDINGS: Artifact from EKG leads. There is no edema, consolidation, effusion, or pneumothorax. Normal heart size and mediastinal contours. IMPRESSION: Negative chest. Electronically Signed   By: Marnee Spring M.D.   On: 03/21/2019 06:29   MR ANGIO HEAD WO CONTRAST  Result Date: 03/21/2019 CLINICAL DATA:  48 year old female with altered mental status and right arm weakness. Code stroke presentation, initially unresponsive. EXAM: MRA HEAD WITHOUT CONTRAST TECHNIQUE: Angiographic images of the Circle of Willis were obtained using MRA technique without intravenous contrast. COMPARISON:  Brain MRI today reported separately. FINDINGS: Mild motion artifact. Antegrade flow in the posterior circulation with codominant distal vertebral arteries. No  distal vertebral stenosis. The left PICA origin is patent. The right PICA is patent but its origin is not included. Patent vertebrobasilar junction and basilar artery without stenosis. Normal SCA and PCA origins. Small left posterior communicating artery, the right is diminutive or absent. Bilateral PCA branches are within normal limits. Antegrade flow in both ICA siphons with some ICA tortuosity just below the skull base. No siphon stenosis. Ophthalmic and left posterior communicating artery origins appear normal. Patent carotid termini with normal MCA and ACA origins. Anterior communicating artery is normal. Visible ACA branches are normal aside from mild tortuosity. Left MCA M1 and bifurcation are normal. Visible left MCA branches are within normal limits. Right MCA M1 segment bifurcates early. Bifurcation and visible right MCA branches are within normal limits. IMPRESSION: Negative intracranial MRA. Electronically Signed   By: Odessa Fleming M.D.   On: 03/21/2019 11:09   MR BRAIN WO CONTRAST  Result Date: 03/21/2019 CLINICAL DATA:  48 year old female with altered mental status and right arm weakness. Code stroke presentation, initially unresponsive. EXAM: MRI HEAD WITHOUT CONTRAST TECHNIQUE: Multiplanar, multiecho pulse sequences of the brain and surrounding structures were obtained without intravenous contrast. COMPARISON:  Head CT 0147 hours today. FINDINGS: Brain: Small asymmetric left choroid plexus cyst, normal variant. No restricted diffusion to suggest acute infarction. No midline shift, mass effect, evidence of mass lesion, ventriculomegaly, extra-axial collection or acute intracranial hemorrhage. Cervicomedullary junction and pituitary are within normal limits. Wallace Cullens and white matter signal is within normal limits for age throughout the brain. No cortical encephalomalacia or chronic cerebral blood products identified. The deep gray nuclei, brainstem and cerebellum appear normal. Vascular: Major  intracranial vascular flow voids are preserved. Skull and upper cervical spine: Negative visible cervical spine. Visualized bone marrow signal is within normal limits. Sinuses/Orbits: Negative orbits. Paranasal sinuses remain clear. Other: Trace mastoid fluid. Negative nasopharynx. Grossly negative visible internal auditory structures. Scalp and face soft tissues appear negative. IMPRESSION: No acute intracranial abnormality. Normal for age non-contrast MRI appearance of the brain. Electronically Signed   By: Odessa Fleming M.D.   On: 03/21/2019 11:00   US Carotid Bilateral  Result Date: 03/21/2019 CLINICAL DATA:  47 year old female with right-sided weakness and numbness, sudden onset EXAM: BILATERAL CAROTID DUPLEX ULTRASOUND TECHNIQUE: Wallace Cullens scale imaging, color Doppler and duplex ultrasound were performed of bilateral carotid and vertebral arteries in the neck. COMPARISON:  None. FINDINGS: Criteria: Quantification of carotid stenosis is based on velocity parameters that correlate the residual internal carotid diameter with NASCET-based stenosis levels, using the diameter of the distal internal carotid lumen as the denominator for stenosis measurement. The following velocity measurements were obtained: RIGHT ICA: 118/46 cm/sec CCA: 93/22 cm/sec SYSTOLIC ICA/CCA RATIO:  1.1 ECA:  84 cm/sec LEFT ICA: 101/37 cm/sec CCA: 102/25 cm/sec SYSTOLIC ICA/CCA RATIO:  0.6 ECA:  60 cm/sec RIGHT CAROTID ARTERY: No significant atherosclerotic plaque or evidence of stenosis in the internal carotid artery. RIGHT VERTEBRAL ARTERY:  Patent with normal antegrade flow. LEFT CAROTID ARTERY: No significant atherosclerotic plaque or evidence of stenosis in the internal carotid artery. LEFT VERTEBRAL ARTERY:  Patent with normal antegrade flow. IMPRESSION: 1. No significant stenosis or atherosclerotic plaque in either internal carotid artery. 2. The vertebral arteries are patent with normal antegrade flow. Signed, Sterling Big, MD, RPVI  [facility] Radiology Electronically Signed   By: Malachy Moan  M.D.   On: 03/21/2019 09:50   EEG adult  Result Date: 03/21/2019 Lora Havens, MD     03/21/2019  6:04 PM Patient Name: ELAH AVELLINO MRN: [id number] Epilepsy Attending: Lora Havens Referring Physician/Provider: Dr Shanon Brow Daniyah Fohl Date: 03/21/2019 Duration: 22.19 mins Patient history: 48yo F with ams. EEG to evaluate for seizure Level of alertness: awake AEDs during EEG study: None Technical aspects: This EEG study was done with scalp electrodes positioned according to the 10-20 International system of electrode placement. Electrical activity was acquired at a sampling rate of 500Hz  and reviewed with a high frequency filter of 70Hz  and a low frequency filter of 1Hz . EEG data were recorded continuously and digitally stored. DESCRIPTION:The posterior dominant rhythm consists of 8 Hz activity of moderate voltage (25-35 uV) seen predominantly in posterior head regions, symmetric and reactive to eye opening and eye closing. Physiologic photic driving was seen during photic stimulation. Hyperventilation was not performed. IMPRESSION: This study is within normal limits. No seizures or epileptiform discharges were seen throughout the recording. Lora Havens   ECHOCARDIOGRAM COMPLETE  Result Date: 03/21/2019   ECHOCARDIOGRAM REPORT   Patient Name:   CHENELL LOZON Date of Exam: 03/21/2019 Medical Rec #:  [id number]         Height:       64.0 in Accession #:    [id number]        Weight:       240.3 lb Date of Birth:  February 20, 1971         BSA:          2.12 m Patient Age:    54 years          BP:           115/72 mmHg Patient Gender: F                 HR:           65 bpm. Exam Location:  [facility] Procedure: 2D Echo, Cardiac Doppler and Color Doppler Indications:    TIA  History:        Patient has no prior history of Echocardiogram examinations.                 Signs/Symptoms:Altered  Mental Status; Risk Factors:Hypertension                 and Current Smoker.  Sonographer:    Roseanna Rainbow RDCS (AE) Referring Phys: [place]  Sonographer Comments: Suboptimal parasternal window and patient is morbidly obese. Image acquisition challenging due to patient body habitus. IMPRESSIONS  1. Left ventricular ejection fraction, by visual estimation, is 65 to 70%. The left ventricle has normal function. There is no left ventricular hypertrophy.  2. Left ventricular diastolic parameters are indeterminate.  3. The left ventricle has no regional wall motion abnormalities.  4. Global right ventricle has normal systolic function.The right ventricular size is normal. No increase in right ventricular wall thickness.  5. Left atrial size was normal.  6. Right atrial size was normal.  7. The mitral valve is normal in structure. No evidence of mitral valve regurgitation.  8. The tricuspid valve is normal in structure. Tricuspid valve regurgitation is trivial.  9. The aortic valve is normal in structure. Aortic valve regurgitation is not visualized. 10. The pulmonic valve was not well visualized. Pulmonic valve regurgitation is trivial. 11. The inferior vena cava is normal in size with greater than 50% respiratory variability, suggesting  right atrial pressure of 3 mmHg. 12. The interatrial septum was not assessed. FINDINGS  Left Ventricle: Left ventricular ejection fraction, by visual estimation, is 65 to 70%. The left ventricle has normal function. The left ventricle has no regional wall motion abnormalities. The left ventricular internal cavity size was the left ventricle is normal in size. There is no left ventricular hypertrophy. Left ventricular diastolic parameters are indeterminate. Right Ventricle: The right ventricular size is normal. No increase in right ventricular wall thickness. Global RV systolic function is has normal systolic function. Left Atrium: Left atrial size was normal in size. Right Atrium:  Right atrial size was normal in size Pericardium: There is no evidence of pericardial effusion. Mitral Valve: The mitral valve is normal in structure. No evidence of mitral valve regurgitation. Tricuspid Valve: The tricuspid valve is normal in structure. Tricuspid valve regurgitation is trivial. Aortic Valve: The aortic valve is normal in structure. Aortic valve regurgitation is not visualized. Pulmonic Valve: The pulmonic valve was not well visualized. Pulmonic valve regurgitation is trivial. Pulmonic regurgitation is trivial. Aorta: The aortic root is normal in size and structure. Venous: The inferior vena cava is normal in size with greater than 50% respiratory variability, suggesting right atrial pressure of 3 mmHg. IAS/Shunts: The interatrial septum was not assessed.  LEFT VENTRICLE PLAX 2D LVIDd:         3.73 cm       Diastology LVIDs:         2.58 cm       LV e' lateral:   13.40 cm/s LV PW:         1.27 cm       LV E/e' lateral: 7.1 LV IVS:        1.42 cm       LV e' medial:    9.90 cm/s LVOT diam:     1.80 cm       LV E/e' medial:  9.6 LV SV:         35 ml LV SV Index:   15.43 LVOT Area:     2.54 cm  LV Volumes (MOD) LV area d, A2C:    24.10 cm LV area d, A4C:    22.90 cm LV area s, A2C:    10.30 cm LV area s, A4C:    11.10 cm LV major d, A2C:   7.37 cm LV major d, A4C:   7.52 cm LV major s, A2C:   5.48 cm LV major s, A4C:   5.82 cm LV vol d, MOD A2C: 66.6 ml LV vol d, MOD A4C: 59.7 ml LV vol s, MOD A2C: 17.2 ml LV vol s, MOD A4C: 18.4 ml LV SV MOD A2C:     49.4 ml LV SV MOD A4C:     59.7 ml LV SV MOD BP:      45.2 ml RIGHT VENTRICLE            IVC RV S prime:     9.17 cm/s  IVC diam: 1.95 cm TAPSE (M-mode): 2.0 cm LEFT ATRIUM             Index       RIGHT ATRIUM           Index LA diam:        3.40 cm 1.61 cm/m  RA Area:     12.00 cm LA Vol (A2C):   45.8 ml 21.65 ml/m RA Volume:   26.50 ml  12.53 ml/m LA Vol (A4C):  32.4 ml 15.32 ml/m LA Biplane Vol: 38.3 ml 18.11 ml/m  AORTIC VALVE LVOT Vmax:    163.00 cm/s LVOT Vmean:  113.000 cm/s LVOT VTI:    0.363 m  AORTA Ao Root diam: 3.00 cm Ao Asc diam:  3.30 cm MITRAL VALVE MV Area (PHT): 3.72 cm             SHUNTS MV PHT:        59.16 msec           Systemic VTI:  0.36 m MV Decel Time: 204 msec             Systemic Diam: 1.80 cm MV E velocity: 94.60 cm/s 103 cm/s MV A velocity: 73.20 cm/s 70.3 cm/s MV E/A ratio:  1.29       1.5  Dietrich Pates MD Electronically signed by Dietrich Pates MD Signature Date/Time: 03/21/2019/10:39:34 PM    Final    CT HEAD CODE STROKE WO CONTRAST  Result Date: 03/21/2019 CLINICAL DATA:  Code stroke. EXAM: CT HEAD WITHOUT CONTRAST TECHNIQUE: Contiguous axial images were obtained from the base of the skull through the vertex without intravenous contrast. COMPARISON:  None. FINDINGS: Brain: There is no mass, hemorrhage or extra-axial collection. The size and configuration of the ventricles and extra-axial CSF spaces are normal. The brain parenchyma is normal, without evidence of acute or chronic infarction. Vascular: No abnormal hyperdensity of the major intracranial arteries or dural venous sinuses. No intracranial atherosclerosis. Skull: The visualized skull base, calvarium and extracranial soft tissues are normal. Sinuses/Orbits: No fluid levels or advanced mucosal thickening of the visualized paranasal sinuses. No mastoid or middle ear effusion. The orbits are normal. ASPECTS [facility] Stroke Program Early CT Score) - Ganglionic level infarction (caudate, lentiform nuclei, internal capsule, insula, M1-M3 cortex): 7 - Supraganglionic infarction (M4-M6 cortex): 3 Total score (0-10 with 10 being normal): 10 IMPRESSION: 1. Normal head CT 2. ASPECTS is 10 These results were called by telephone at the time of interpretation on 03/21/2019 at 2:03 am to provider [facility] , who verbally acknowledged these results. Electronically Signed   By: Deatra Robinson M.D.   On: 03/21/2019 02:08   US Abdomen Limited RUQ  Result Date:  03/21/2019 CLINICAL DATA:  Upper abdominal pain EXAM: ULTRASOUND ABDOMEN LIMITED RIGHT UPPER QUADRANT COMPARISON:  CT abdomen and pelvis October 28, 2018 FINDINGS: Gallbladder: Surgically absent. Common bile duct: Diameter: 6 mm. No intrahepatic or extrahepatic biliary duct dilatation. Liver: No focal lesion identified. Liver echogenicity overall is increased. Portal vein is patent on color Doppler imaging with normal direction of blood flow towards the liver. Other: None. IMPRESSION: 1. Diffuse increase in liver echogenicity, a finding indicative of hepatic steatosis. While no focal liver lesions are evident on this study, it must be cautioned that the sensitivity of ultrasound for detection of focal liver lesions is diminished in this circumstance. Note that apparent cystic area in the anterior segment right lobe of the liver seen on prior CT is not appreciable on this sonographic evaluation. 2.  Gallbladder absent. Electronically Signed   By: Bretta Bang III M.D.   On: 03/21/2019 09:31     Microbiology: Recent Results (from the past 240 hour(s))  SARS CORONAVIRUS 2 (Zettie [place] 6-24 HRS) Nasopharyngeal Nasopharyngeal Swab     Status: None   Collection Time: 03/21/19  5:00 AM   Specimen: Nasopharyngeal Swab  Result Value Ref Range Status   SARS Coronavirus 2 NEGATIVE NEGATIVE Final    Comment: (NOTE) SARS-CoV-2 target nucleic acids  are NOT DETECTED. The SARS-CoV-2 RNA is generally detectable in upper and lower respiratory specimens during the acute phase of infection. Negative results do not preclude SARS-CoV-2 infection, do not rule out co-infections with other pathogens, and should not be used as the sole basis for treatment or other patient management decisions. Negative results must be combined with clinical observations, patient history, and epidemiological information. The expected result is Negative. Fact Sheet for Patients: HairSlick.no Fact Sheet for  Healthcare Providers: quierodirigir.com This test is not yet approved or cleared by the [place] FDA and  has been authorized for detection and/or diagnosis of SARS-CoV-2 by FDA under an Emergency Use Authorization (EUA). This EUA will remain  in effect (meaning this test can be used) for the duration of the COVID-19 declaration under Section 56 4(b)(1) of the Act, 21 U.S.C. section 360bbb-3(b)(1), unless the authorization is terminated or revoked sooner. Performed at [facility], [address], [place], [place] [address]      Labs: Basic Metabolic Panel: Recent Labs  Lab 03/21/19 0302 03/22/19 0445  NA 140 142  K 3.5 3.6  CL 105 105  CO2 26 26  GLUCOSE 114* 89  BUN 14 9  CREATININE 0.81 0.74  CALCIUM 9.0 8.5*  MG  --  1.9   Liver Function Tests: Recent Labs  Lab 03/21/19 0302  AST 14*  ALT 18  ALKPHOS 72  BILITOT 0.6  PROT 6.9  ALBUMIN 3.9   No results for input(s): LIPASE, AMYLASE in the last 168 hours. Recent Labs  Lab 03/21/19 0821  AMMONIA 21   CBC: Recent Labs  Lab 03/21/19 0302 03/22/19 0445  WBC 8.9 5.7  NEUTROABS 6.8  --   HGB 12.3 11.7*  HCT 38.7 36.5  MCV 86.4 86.7  PLT 240 235   Cardiac Enzymes: No results for input(s): CKTOTAL, CKMB, CKMBINDEX, TROPONINI in the last 168 hours. BNP: Invalid input(s): POCBNP CBG: No results for input(s): GLUCAP in the last 168 hours.  Time coordinating discharge:  36 minutes  Signed:  Catarina Hartshorn, DO [facility] Pager: [phone] 03/22/2019, 12:17 PM

## 2019-03-22 NOTE — Progress Notes (Signed)
Nsg Discharge Note  Admit Date:  03/21/2019 Discharge date: 03/22/2019   Leslie Lowery to be D/C'd home per MD order.  AVS completed.  Copy for chart, and copy for patient signed, and dated. Patient/caregiver able to verbalize understanding.  Discharge Medication: Allergies as of 03/22/2019   No Known Allergies     Medication List    TAKE these medications   albuterol 108 (90 Base) MCG/ACT inhaler Commonly known as: VENTOLIN HFA Inhale 1-2 puffs into the lungs every 6 (six) hours as needed for wheezing or shortness of breath.   amphetamine-dextroamphetamine 20 MG tablet Commonly known as: ADDERALL Take 20 mg by mouth daily.   oxyCODONE 5 MG immediate release tablet Commonly known as: Oxy IR/ROXICODONE Take 5 mg by mouth 4 (four) times daily.   vitamin B-12 500 MCG tablet Commonly known as: CYANOCOBALAMIN Take 1 tablet (500 mcg total) by mouth daily. Start taking on: March 23, 2019       Discharge Assessment: Vitals:   03/22/19 0900 03/22/19 1314  BP: 122/72 136/85  Pulse: 70 65  Resp: 16 18  Temp: 98.2 F (36.8 C) 98.1 F (36.7 C)  SpO2: 96% 98%   Skin clean, dry and intact without evidence of skin break down, no evidence of skin tears noted. IV catheter discontinued intact. Site without signs and symptoms of complications - no redness or edema noted at insertion site, patient denies c/o pain - only slight tenderness at site.  Dressing with slight pressure applied.  D/c Instructions-Education: Discharge instructions given to patient/family with verbalized understanding. D/c education completed with patient/family including follow up instructions, medication list, d/c activities limitations if indicated, with other d/c instructions as indicated by MD - patient able to verbalize understanding, all questions fully answered. Patient instructed to return to ED, call 911, or call MD for any changes in condition.  Patient escorted via [place], and D/C home via private  auto.  Venita Sheffield, RN 03/22/2019 1:53 PM

## 2019-03-22 NOTE — Progress Notes (Signed)
OT Cancellation Note  Patient Details Name: Leslie Lowery MRN: [id number] DOB: 07-14-1970   Cancelled Treatment:    Reason Eval/Treat Not Completed: OT screened, no needs identified, will sign off. Pt reporting feeling back to her baseline this am. Evaluation completed at EOB as pt reports dizziness with mobility. Pt demonstrates BUE strength 4+/5, coordination and sensation are intact. Pt reports blurred vision upon arrival which has now cleared and is back to baseline. No further OT services required at this time, pt report daughter is able to assist if needed upon discharge home.    Guadelupe Sabin, OTR/L  [phone] 03/22/2019, 8:03 AM

## 2019-03-22 NOTE — Progress Notes (Signed)
Patient has loss of IV access.  Patient requests for IV not to be reattempted at this time.  Patient states if she is not discharged, she will allow for IV to be restarted. IV removed from left hand, left hand elevated.  Will continue to monitor patient.

## 2019-03-22 NOTE — Plan of Care (Signed)
  Problem: Education: Goal: Knowledge of General Education information will improve Description: Including pain rating scale, medication(s)/side effects and non-pharmacologic comfort measures Outcome: Adequate for Discharge   Problem: Health Behavior/Discharge Planning: Goal: Ability to manage health-related needs will improve Outcome: Adequate for Discharge   Problem: Clinical Measurements: Goal: Ability to maintain clinical measurements within normal limits will improve Outcome: Adequate for Discharge Goal: Will remain free from infection Outcome: Adequate for Discharge Goal: Diagnostic test results will improve Outcome: Adequate for Discharge Goal: Respiratory complications will improve Outcome: Adequate for Discharge Goal: Cardiovascular complication will be avoided Outcome: Adequate for Discharge   Problem: Activity: Goal: Risk for activity intolerance will decrease Outcome: Adequate for Discharge   Problem: Nutrition: Goal: Adequate nutrition will be maintained Outcome: Adequate for Discharge   Problem: Coping: Goal: Level of anxiety will decrease Outcome: Adequate for Discharge   Problem: Elimination: Goal: Will not experience complications related to bowel motility Outcome: Adequate for Discharge Goal: Will not experience complications related to urinary retention Outcome: Adequate for Discharge   Problem: Pain Managment: Goal: General experience of comfort will improve Outcome: Adequate for Discharge   Problem: Safety: Goal: Ability to remain free from injury will improve Outcome: Adequate for Discharge   Problem: Skin Integrity: Goal: Risk for impaired skin integrity will decrease Outcome: Adequate for Discharge   Problem: Education: Goal: Knowledge of disease or condition will improve Outcome: Adequate for Discharge Goal: Knowledge of secondary prevention will improve Outcome: Adequate for Discharge Goal: Knowledge of patient specific risk factors  addressed and post discharge goals established will improve Outcome: Adequate for Discharge   Problem: Coping: Goal: Will verbalize positive feelings about self Outcome: Adequate for Discharge   Problem: Coping: Goal: Will verbalize positive feelings about self Outcome: Adequate for Discharge Goal: Will identify appropriate support needs Outcome: Adequate for Discharge   Problem: Health Behavior/Discharge Planning: Goal: Ability to manage health-related needs will improve Outcome: Adequate for Discharge   Problem: Self-Care: Goal: Ability to participate in self-care as condition permits will improve Outcome: Adequate for Discharge Goal: Verbalization of feelings and concerns over difficulty with self-care will improve Outcome: Adequate for Discharge Goal: Ability to communicate needs accurately will improve Outcome: Adequate for Discharge   Problem: Intracerebral Hemorrhage Tissue Perfusion: Goal: Complications of Intracerebral Hemorrhage will be minimized Outcome: Adequate for Discharge   Problem: Ischemic Stroke/TIA Tissue Perfusion: Goal: Complications of ischemic stroke/TIA will be minimized Outcome: Adequate for Discharge   Problem: Spontaneous Subarachnoid Hemorrhage Tissue Perfusion: Goal: Complications of Spontaneous Subarachnoid Hemorrhage will be minimized Outcome: Adequate for Discharge

## 2019-03-30 LAB — MISC LABCORP TEST (SEND OUT)
LabCorp test name: 803301
Labcorp test code: 701364

## 2019-04-04 DIAGNOSIS — M2550 Pain in unspecified joint: Secondary | ICD-10-CM | POA: Diagnosis not present

## 2019-04-04 DIAGNOSIS — R5382 Chronic fatigue, unspecified: Secondary | ICD-10-CM | POA: Diagnosis not present

## 2019-04-04 DIAGNOSIS — G8929 Other chronic pain: Secondary | ICD-10-CM | POA: Diagnosis not present

## 2019-04-04 DIAGNOSIS — E7849 Other hyperlipidemia: Secondary | ICD-10-CM | POA: Diagnosis not present

## 2019-04-12 ENCOUNTER — Other Ambulatory Visit: Payer: Self-pay

## 2019-04-12 ENCOUNTER — Ambulatory Visit
Admission: EM | Admit: 2019-04-12 | Discharge: 2019-04-12 | Disposition: A | Discharge status: Home / Self Care | Payer: Medicaid Other | Attending: Emergency Medicine | Admitting: Emergency Medicine

## 2019-04-12 DIAGNOSIS — Z20822 Contact with and (suspected) exposure to covid-19: Secondary | ICD-10-CM | POA: Diagnosis not present

## 2019-04-12 LAB — POC SARS CORONAVIRUS 2 AG -  ED: SARS Coronavirus 2 Ag: NEGATIVE

## 2019-04-12 MED ORDER — FLUTICASONE PROPIONATE 50 MCG/ACT NA SUSP: 1.0000 | Freq: Every day | NASAL | 0 refills | Status: DC

## 2019-04-12 NOTE — Discharge Instructions (Addendum)
Point-of-care COVID-19 test was negative COVID testing ordered.  It will take between 2-7 days for test results.  Someone will contact you regarding abnormal results.    In the meantime: You should remain isolated in your home for 10 days from symptom onset AND greater than 72 hours after symptoms resolution (absence of fever without the use of fever-reducing medication and improvement in respiratory symptoms), whichever is longer Get plenty of rest and push fluids Flonase prescribed for nasal congestion and runny nose Use medications daily for symptom relief Use OTC medications like ibuprofen or tylenol as needed fever or pain Call or go to the ED if you have any new or worsening symptoms such as fever, worsening cough, shortness of breath, chest tightness, chest pain, turning blue, changes in mental status, etc..Marland Kitchen

## 2019-04-12 NOTE — ED Provider Notes (Signed)
[facility]    CSN: [id number] Arrival date & time: 04/12/19  1108      History   Chief Complaint No chief complaint on file.   HPI Leslie Lowery is a 49 y.o. female.   Leslie Lowery 49 years old female presented to urgent care with a complaint of sinus pressure sinus pain for the past 2 days  Reported positive Covid exposure the past week.  Denies sick exposure to  flu or strep.  Denies recent travel.  Denies aggravating or alleviating symptoms.  Denies previous COVID infection.   Denies fever, chills, fatigue, nasal congestion, rhinorrhea, sore throat, cough, SOB, wheezing, chest pain, nausea, vomiting, changes in bowel or bladder habits.    The history is provided by the patient. No language interpreter was used.    Past Medical History:  Diagnosis Date  . Anxiety   . Disc disease, degenerative, cervical   . Hypertension     Patient Active Problem List   Diagnosis Date Noted  . Altered mental status 03/21/2019  . Acute metabolic encephalopathy 03/21/2019  . Right hemiparesis (HCC) 03/21/2019  . Tobacco abuse 03/21/2019  . Chronic pain syndrome 03/21/2019  . Dyspepsia 10/27/2018  . RLQ abdominal pain 10/27/2018  . Chronic cholecystitis   . Fatty liver   . Nausea vomiting and diarrhea   . RUQ abdominal pain 09/04/2018  . Hepatomegaly 09/04/2018  . Elevated BP without diagnosis of hypertension 09/04/2018    Past Surgical History:  Procedure Laterality Date  . CESAREAN SECTION     X 2  . CHOLECYSTECTOMY N/A 09/07/2018   Procedure: LAPAROSCOPIC CHOLECYSTECTOMY;  Surgeon: Lucretia Roers, MD;  Location: [facility];  Service: General;  Laterality: N/A;  . LIVER BIOPSY N/A 09/07/2018   Procedure: LAPAROSCOPIC  LIVER BIOPSY;  Surgeon: Lucretia Roers, MD;  Location: [facility];  Service: General;  Laterality: N/A;    OB History    Gravida  2   Para  2   Term  1   Preterm  1   AB      Living        SAB      TAB      Ectopic       Multiple      Live Births               Home Medications    Prior to Admission medications   Medication Sig Start Date End Date Taking? Authorizing Provider  albuterol (VENTOLIN HFA) 108 (90 Base) MCG/ACT inhaler Inhale 1-2 puffs into the lungs every 6 (six) hours as needed for wheezing or shortness of breath.    [provider]  amphetamine-dextroamphetamine (ADDERALL) 20 MG tablet Take 20 mg by mouth daily. 03/04/19   [provider]  oxyCODONE (OXY IR/ROXICODONE) 5 MG immediate release tablet Take 5 mg by mouth 4 (four) times daily. 03/04/19   [provider]  vitamin B-12 (CYANOCOBALAMIN) 500 MCG tablet Take 1 tablet (500 mcg total) by mouth daily. 03/23/19   Catarina Hartshorn, MD    Family History Family History  Problem Relation Age of Onset  . Diabetes Mother   . CAD Mother   . CAD Father   . Diabetes Father   . Throat cancer Father   . Colon cancer Neg Hx   . Colon polyps Neg Hx        no first degree relatives with polyps  . Liver disease Neg Hx     Social History  Social History   Tobacco Use  . Smoking status: Current Every Day Smoker    Packs/day: 1.00    Types: Cigarettes  . Smokeless tobacco: Never Used  Substance Use Topics  . Alcohol use: No  . Drug use: Yes    Types: Marijuana    Comment: last use was today     Allergies   Patient has no known allergies.   Review of Systems Review of Systems  Constitutional: Negative.   HENT: Negative.   Respiratory: Negative.   Cardiovascular: Negative.   Gastrointestinal: Negative.   Neurological: Negative.   All other systems reviewed and are negative.    Physical Exam Triage Vital Signs ED Triage Vitals  Enc Vitals Group     BP 04/12/19 1120 (!) 144/82     Pulse Rate 04/12/19 1120 93     Resp 04/12/19 1120 15     Temp 04/12/19 1120 98.7 F (37.1 C)     Temp Source 04/12/19 1120 Oral     SpO2 04/12/19 1120 93 %     Weight --      Height --      Head Circumference --       Peak Flow --      Pain Score 04/12/19 1128 0     Pain Loc --      Pain Edu? --      Excl. in GC? --    No data found.  Updated Vital Signs BP (!) 144/82 (BP Location: Right Arm)   Pulse 93   Temp 98.7 F (37.1 C) (Oral)   Resp 15   LMP 11/01/2008 (Approximate)   SpO2 93%   Visual Acuity Right Eye Distance:   Left Eye Distance:   Bilateral Distance:    Right Eye Near:   Left Eye Near:    Bilateral Near:     Physical Exam Vitals and nursing note reviewed.  Constitutional:      General: She is not in acute distress.    Appearance: Normal appearance. She is normal weight. She is not ill-appearing or toxic-appearing.  HENT:     Head: Normocephalic.     Right Ear: Tympanic membrane, ear canal and external ear normal. There is no impacted cerumen.     Left Ear: Tympanic membrane, ear canal and external ear normal. There is no impacted cerumen.     Nose: Nose normal. No congestion.     Mouth/Throat:     Mouth: Mucous membranes are moist.     Pharynx: Oropharynx is clear. No oropharyngeal exudate or posterior oropharyngeal erythema.  Cardiovascular:     Rate and Rhythm: Normal rate and regular rhythm.     Pulses: Normal pulses.     Heart sounds: Normal heart sounds. No murmur.  Pulmonary:     Effort: Pulmonary effort is normal. No respiratory distress.     Breath sounds: Normal breath sounds. No wheezing or rhonchi.  Chest:     Chest wall: No tenderness.  Abdominal:     General: Abdomen is flat. Bowel sounds are normal. There is no distension.     Palpations: There is no mass.     Tenderness: There is no abdominal tenderness.  Skin:    Capillary Refill: Capillary refill takes less than 2 seconds.  Neurological:     General: No focal deficit present.     Mental Status: She is alert and oriented to person, place, and time.      UC Treatments / Results  Labs (  all labs ordered are listed, but only abnormal results are displayed) Labs Reviewed  POC SARS  CORONAVIRUS 2 AG -  ED    EKG   Radiology No results found.  Procedures Procedures (including critical care time)  Medications Ordered in UC Medications - No data to display  Initial Impression / Assessment and Plan / UC Course  I have reviewed the triage vital signs and the nursing notes.  Pertinent labs & imaging results that were available during my care of the patient were reviewed by me and considered in my medical decision making (see chart for details).     Point-of-care COVID-19 test was ordered and result was reviewed.  Result was negative for COVID-19 infection.  [facility] COVID-19 test will be ordered.  Patient is advised to remain quarantine until COVID-19 test result become available.  To go to ED for worsening of symptoms.  Patient verbalized understanding plan of care. Final Clinical Impressions(s) / UC Diagnoses   Final diagnoses:  Suspected COVID-19 virus infection     Discharge Instructions     COVID testing ordered.  It will take between 2-7 days for test results.  Someone will contact you regarding abnormal results.    In the meantime: You should remain isolated in your home for 10 days from symptom onset AND greater than 72 hours after symptoms resolution (absence of fever without the use of fever-reducing medication and improvement in respiratory symptoms), whichever is longer Get plenty of rest and push fluids Flonase prescribed for nasal congestion and runny nose Use medications daily for symptom relief Use OTC medications like ibuprofen or tylenol as needed fever or pain Call or go to the ED if you have any new or worsening symptoms such as fever, worsening cough, shortness of breath, chest tightness, chest pain, turning blue, changes in mental status, etc...     ED Prescriptions    None     PDMP not reviewed this encounter.   Emerson Monte, FNP 04/12/19 1202

## 2019-04-12 NOTE — ED Triage Notes (Signed)
Pt developed sinus symptoms after positive exposure to covid

## 2019-04-14 ENCOUNTER — Ambulatory Visit
Admission: EM | Admit: 2019-04-14 | Discharge: 2019-04-14 | Disposition: A | Discharge status: Home / Self Care | Payer: Medicaid Other | Attending: Emergency Medicine | Admitting: Emergency Medicine

## 2019-04-14 ENCOUNTER — Other Ambulatory Visit: Payer: Self-pay

## 2019-04-14 DIAGNOSIS — J019 Acute sinusitis, unspecified: Secondary | ICD-10-CM

## 2019-04-14 DIAGNOSIS — Z20822 Contact with and (suspected) exposure to covid-19: Secondary | ICD-10-CM | POA: Diagnosis not present

## 2019-04-14 DIAGNOSIS — J441 Chronic obstructive pulmonary disease with (acute) exacerbation: Secondary | ICD-10-CM

## 2019-04-14 LAB — NOVEL CORONAVIRUS, NAA: SARS-CoV-2, NAA: NOT DETECTED

## 2019-04-14 MED ORDER — AMOXICILLIN-POT CLAVULANATE 875-125 MG PO TABS: 1.0000 | ORAL_TABLET | Freq: Two times a day (BID) | ORAL | 0 refills | Status: AC

## 2019-04-14 MED ORDER — AZITHROMYCIN 250 MG PO TABS: 250.0000 mg | ORAL_TABLET | Freq: Every day | ORAL | 0 refills | Status: DC

## 2019-04-14 NOTE — Discharge Instructions (Addendum)
COVID test negative, however, based on exposure, physical exam, and close exposure to COVID I am concerned this patient still may have COVID.   You should remain isolated in your home for 10 days from symptom onset AND greater than 72 hours after symptoms resolution (absence of fever without the use of fever-reducing medication and improvement in respiratory symptoms), whichever is longer Get plenty of rest and push fluids Augmentin prescribed for possible sinusitis.  Take as directed and to completion Z-pak prescribed for possible COPD exacerbation Take prednisone as 40 mg daily for 5 days.   Use OTC zyrtec for nasal congestion, runny nose, and/or sore throat Use OTC flonase for nasal congestion and runny nose Use medications daily for symptom relief Use OTC medications like ibuprofen or tylenol as needed fever or pain Follow up with PCP next week for recheck and to ensure symptoms are improving Call or go to the ED if you have any new or worsening symptoms such as fever, worsening cough, shortness of breath, chest tightness, chest pain, turning blue, changes in mental status, etc..Marland Kitchen

## 2019-04-14 NOTE — ED Provider Notes (Signed)
[place]   [id number] 04/14/19 Arrival Time: 9381   CC: COVID symptoms  SUBJECTIVE: History from: patient.  MALEEYA PETERKIN is a 49 y.o. female who presents with worsening fatigue, runny nose, congestion, sinus pain/ pressure, productive cough with green sputum, nausea, loss of taste/smell, and watery diarrhea (3-4x daily) x 4 days.  Tmax of 101.3.  Daughter tested positive for COVID.  Denies recent travel.  Has tried OTC medication without relief.  Denies aggravating factors.  Reports previous symptoms in the past related to the flu.  Denies chills, sore throat, SOB, chest pain, changes in bowel or bladder habits.    Hx significant for COPD.    ROS: As per HPI.  All other pertinent ROS negative.     Past Medical History:  Diagnosis Date  . Anxiety   . Disc disease, degenerative, cervical   . Hypertension    Past Surgical History:  Procedure Laterality Date  . CESAREAN SECTION     X 2  . CHOLECYSTECTOMY N/A 09/07/2018   Procedure: LAPAROSCOPIC CHOLECYSTECTOMY;  Surgeon: Virl Cagey, MD;  Location: [facility];  Service: General;  Laterality: N/A;  . LIVER BIOPSY N/A 09/07/2018   Procedure: LAPAROSCOPIC  LIVER BIOPSY;  Surgeon: Virl Cagey, MD;  Location: [facility];  Service: General;  Laterality: N/A;   No Known Allergies No current facility-administered medications on file prior to encounter.   Current Outpatient Medications on File Prior to Encounter  Medication Sig Dispense Refill  . albuterol (VENTOLIN HFA) 108 (90 Base) MCG/ACT inhaler Inhale 1-2 puffs into the lungs every 6 (six) hours as needed for wheezing or shortness of breath.    . amphetamine-dextroamphetamine (ADDERALL) 20 MG tablet Take 20 mg by mouth daily.    . fluticasone (FLONASE) 50 MCG/ACT nasal spray Place 1 spray into both nostrils daily for 14 days. 16 g 0  . oxyCODONE (OXY IR/ROXICODONE) 5 MG immediate release tablet Take 5 mg by mouth 4 (four) times daily.    . vitamin B-12  (CYANOCOBALAMIN) 500 MCG tablet Take 1 tablet (500 mcg total) by mouth daily.     Social History   Socioeconomic History  . Marital status: Single    Spouse name: Not on file  . Number of children: Not on file  . Years of education: Not on file  . Highest education level: Not on file  Occupational History  . Not on file  Tobacco Use  . Smoking status: Current Every Day Smoker    Packs/day: 1.00    Types: Cigarettes  . Smokeless tobacco: Never Used  Substance and Sexual Activity  . Alcohol use: No  . Drug use: Yes    Types: Marijuana    Comment: last use was today  . Sexual activity: Not on file  Other Topics Concern  . Not on file  Social History Narrative  . Not on file   Social Determinants of Health   Financial Resource Strain:   . Difficulty of Paying Living Expenses: Not on file  Food Insecurity:   . Worried About Charity fundraiser in the Last Year: Not on file  . Ran Out of Food in the Last Year: Not on file  Transportation Needs:   . Lack of Transportation (Medical): Not on file  . Lack of Transportation (Non-Medical): Not on file  Physical Activity:   . Days of Exercise per Week: Not on file  . Minutes of Exercise per Session: Not on file  Stress:   .  Feeling of Stress : Not on file  Social Connections:   . Frequency of Communication with Friends and Family: Not on file  . Frequency of Social Gatherings with Friends and Family: Not on file  . Attends Religious Services: Not on file  . Active Member of Clubs or Organizations: Not on file  . Attends Banker Meetings: Not on file  . Marital Status: Not on file  Intimate Partner Violence:   . Fear of Current or Ex-Partner: Not on file  . Emotionally Abused: Not on file  . Physically Abused: Not on file  . Sexually Abused: Not on file   Family History  Problem Relation Age of Onset  . Diabetes Mother   . CAD Mother   . CAD Father   . Diabetes Father   . Throat cancer Father   . Colon  cancer Neg Hx   . Colon polyps Neg Hx        no first degree relatives with polyps  . Liver disease Neg Hx     OBJECTIVE:  Vitals:   04/14/19 1458  BP: (!) 137/93  Pulse: (!) 102  Resp: 17  Temp: 99.1 F (37.3 C)  TempSrc: Oral  SpO2: 95%     General appearance: alert; appears fatigued, but nontoxic; speaking in full sentences and tolerating own secretions HEENT: NCAT; Ears: EACs clear, TMs pearly gray; Eyes: PERRL.  EOM grossly intact. Sinuses: TTP over frontal and maxillary sinuses; Nose: nares patent without rhinorrhea, turbinates swollen and erythematou, Throat: oropharynx clear, tonsils non erythematous or enlarged, uvula midline  Neck: supple without LAD Lungs: unlabored respirations, symmetrical air entry; cough: mild; no respiratory distress; wheezes heard over RT lung Heart: regular rate and rhythm.  Skin: warm and dry Psychological: alert and cooperative; normal mood and affect   ASSESSMENT & PLAN:  1. Suspected COVID-19 virus infection   2. Chronic maxillary sinusitis   3. COPD exacerbation (HCC)     Meds ordered this encounter  Medications  . amoxicillin-clavulanate (AUGMENTIN) 875-125 MG tablet    Sig: Take 1 tablet by mouth every 12 (twelve) hours for 10 days.    Dispense:  20 tablet    Refill:  0    Order Specific Question:   Supervising Provider    Answer:   Eustace Moore [[id number]]  . azithromycin (ZITHROMAX) 250 MG tablet    Sig: Take 1 tablet (250 mg total) by mouth daily. Take first 2 tablets together, then 1 every day until finished.    Dispense:  6 tablet    Refill:  0    Order Specific Question:   Supervising Provider    Answer:   Eustace Moore [[id number]]   COVID test negative, however, based on exposure, physical exam, and close exposure to COVID I am concerned this patient still may have COVID.   You should remain isolated in your home for 10 days from symptom onset AND greater than 72 hours after symptoms resolution (absence of  fever without the use of fever-reducing medication and improvement in respiratory symptoms), whichever is longer Get plenty of rest and push fluids Augmentin prescribed for possible sinusitis.  Take as directed and to completion Z-pak prescribed for possible COPD exacerbation Take prednisone as 40 mg daily for 5 days.   Use OTC zyrtec for nasal congestion, runny nose, and/or sore throat Use OTC flonase for nasal congestion and runny nose Use medications daily for symptom relief Use OTC medications like ibuprofen or tylenol as needed fever  or pain Follow up with PCP next week for recheck and to ensure symptoms are improving Call or go to the ED if you have any new or worsening symptoms such as fever, worsening cough, shortness of breath, chest tightness, chest pain, turning blue, changes in mental status, etc...   Reviewed expectations re: course of current medical issues. Questions answered. Outlined signs and symptoms indicating need for more acute intervention. Patient verbalized understanding. After Visit Summary given.         Rennis Harding, PA-C 04/14/19 1753

## 2019-04-14 NOTE — ED Triage Notes (Signed)
Pt presents to UC w/ c/o worsening sinus sxs of productive cough and nasal drainage which worsened over 3 days. C/o loss of taste/smell. Pt tested negative for covid 2 days ago.

## 2019-05-02 DIAGNOSIS — E662 Morbid (severe) obesity with alveolar hypoventilation: Secondary | ICD-10-CM | POA: Diagnosis not present

## 2019-05-02 DIAGNOSIS — M545 Low back pain: Secondary | ICD-10-CM | POA: Diagnosis not present

## 2019-05-02 DIAGNOSIS — M2550 Pain in unspecified joint: Secondary | ICD-10-CM | POA: Diagnosis not present

## 2019-05-30 DIAGNOSIS — M2550 Pain in unspecified joint: Secondary | ICD-10-CM | POA: Diagnosis not present

## 2019-05-30 DIAGNOSIS — R5382 Chronic fatigue, unspecified: Secondary | ICD-10-CM | POA: Diagnosis not present

## 2019-05-30 DIAGNOSIS — G8929 Other chronic pain: Secondary | ICD-10-CM | POA: Diagnosis not present

## 2019-06-30 DIAGNOSIS — M545 Low back pain: Secondary | ICD-10-CM | POA: Diagnosis not present

## 2019-06-30 DIAGNOSIS — G8929 Other chronic pain: Secondary | ICD-10-CM | POA: Diagnosis not present

## 2019-06-30 DIAGNOSIS — K219 Gastro-esophageal reflux disease without esophagitis: Secondary | ICD-10-CM | POA: Diagnosis not present

## 2019-06-30 DIAGNOSIS — G609 Hereditary and idiopathic neuropathy, unspecified: Secondary | ICD-10-CM | POA: Diagnosis not present

## 2019-07-28 DIAGNOSIS — E7849 Other hyperlipidemia: Secondary | ICD-10-CM | POA: Diagnosis not present

## 2019-07-28 DIAGNOSIS — G8929 Other chronic pain: Secondary | ICD-10-CM | POA: Diagnosis not present

## 2019-07-28 DIAGNOSIS — R5382 Chronic fatigue, unspecified: Secondary | ICD-10-CM | POA: Diagnosis not present

## 2019-07-30 DIAGNOSIS — R1084 Generalized abdominal pain: Secondary | ICD-10-CM | POA: Diagnosis not present

## 2019-07-30 DIAGNOSIS — R000 Tachycardia, unspecified: Secondary | ICD-10-CM | POA: Diagnosis not present

## 2019-07-30 DIAGNOSIS — R1031 Right lower quadrant pain: Secondary | ICD-10-CM | POA: Diagnosis not present

## 2019-07-30 DIAGNOSIS — R110 Nausea: Secondary | ICD-10-CM | POA: Diagnosis not present

## 2019-07-31 DIAGNOSIS — R1031 Right lower quadrant pain: Secondary | ICD-10-CM | POA: Diagnosis not present

## 2019-08-25 DIAGNOSIS — M545 Low back pain: Secondary | ICD-10-CM | POA: Diagnosis not present

## 2019-08-25 DIAGNOSIS — M2550 Pain in unspecified joint: Secondary | ICD-10-CM | POA: Diagnosis not present

## 2019-08-25 DIAGNOSIS — M542 Cervicalgia: Secondary | ICD-10-CM | POA: Diagnosis not present

## 2019-08-25 DIAGNOSIS — I110 Hypertensive heart disease with heart failure: Secondary | ICD-10-CM | POA: Diagnosis not present

## 2019-09-22 DIAGNOSIS — G8929 Other chronic pain: Secondary | ICD-10-CM | POA: Diagnosis not present

## 2019-09-22 DIAGNOSIS — E7849 Other hyperlipidemia: Secondary | ICD-10-CM | POA: Diagnosis not present

## 2019-09-22 DIAGNOSIS — M545 Low back pain: Secondary | ICD-10-CM | POA: Diagnosis not present

## 2019-10-20 DIAGNOSIS — M2550 Pain in unspecified joint: Secondary | ICD-10-CM | POA: Diagnosis not present

## 2019-10-20 DIAGNOSIS — G9050 Complex regional pain syndrome I, unspecified: Secondary | ICD-10-CM | POA: Diagnosis not present

## 2019-10-20 DIAGNOSIS — E7849 Other hyperlipidemia: Secondary | ICD-10-CM | POA: Diagnosis not present

## 2019-11-17 DIAGNOSIS — R5382 Chronic fatigue, unspecified: Secondary | ICD-10-CM | POA: Diagnosis not present

## 2019-11-17 DIAGNOSIS — I119 Hypertensive heart disease without heart failure: Secondary | ICD-10-CM | POA: Diagnosis not present

## 2019-11-17 DIAGNOSIS — G8929 Other chronic pain: Secondary | ICD-10-CM | POA: Diagnosis not present

## 2019-11-17 DIAGNOSIS — K219 Gastro-esophageal reflux disease without esophagitis: Secondary | ICD-10-CM | POA: Diagnosis not present

## 2019-12-15 ENCOUNTER — Encounter (HOSPITAL_COMMUNITY): Payer: Self-pay | Admitting: *Deleted

## 2019-12-15 ENCOUNTER — Emergency Department (HOSPITAL_COMMUNITY)
Admission: EM | Admit: 2019-12-15 | Discharge: 2019-12-15 | Disposition: A | Discharge status: Home / Self Care | Payer: Medicaid Other | Attending: Emergency Medicine | Admitting: Emergency Medicine

## 2019-12-15 ENCOUNTER — Emergency Department (HOSPITAL_COMMUNITY): Payer: Medicaid Other

## 2019-12-15 ENCOUNTER — Other Ambulatory Visit: Payer: Self-pay

## 2019-12-15 DIAGNOSIS — J441 Chronic obstructive pulmonary disease with (acute) exacerbation: Secondary | ICD-10-CM

## 2019-12-15 DIAGNOSIS — R0789 Other chest pain: Secondary | ICD-10-CM | POA: Diagnosis not present

## 2019-12-15 DIAGNOSIS — F17210 Nicotine dependence, cigarettes, uncomplicated: Secondary | ICD-10-CM | POA: Diagnosis not present

## 2019-12-15 DIAGNOSIS — I10 Essential (primary) hypertension: Secondary | ICD-10-CM | POA: Insufficient documentation

## 2019-12-15 DIAGNOSIS — Z20822 Contact with and (suspected) exposure to covid-19: Secondary | ICD-10-CM | POA: Diagnosis not present

## 2019-12-15 DIAGNOSIS — R079 Chest pain, unspecified: Secondary | ICD-10-CM | POA: Diagnosis not present

## 2019-12-15 LAB — CBC
HCT: 40.7 % (ref 36.0–46.0)
Hemoglobin: 13.4 g/dL (ref 12.0–15.0)
MCH: 27.6 pg (ref 26.0–34.0)
MCHC: 32.9 g/dL (ref 30.0–36.0)
MCV: 83.9 fL (ref 80.0–100.0)
Platelets: 314 10*3/uL (ref 150–400)
RBC: 4.85 MIL/uL (ref 3.87–5.11)
RDW: 13.8 % (ref 11.5–15.5)
WBC: 8.5 10*3/uL (ref 4.0–10.5)
nRBC: 0 % (ref 0.0–0.2)

## 2019-12-15 LAB — BASIC METABOLIC PANEL
Anion gap: 10 (ref 5–15)
BUN: 11 mg/dL (ref 6–20)
CO2: 29 mmol/L (ref 22–32)
Calcium: 9.1 mg/dL (ref 8.9–10.3)
Chloride: 103 mmol/L (ref 98–111)
Creatinine, Ser: 0.75 mg/dL (ref 0.44–1.00)
GFR calc Af Amer: >60 mL/min (ref >60)
GFR calc non Af Amer: >60 mL/min (ref >60)
Glucose, Bld: 101 mg/dL — ABNORMAL HIGH (ref 70–99)
Potassium: 3.4 mmol/L — ABNORMAL LOW (ref 3.5–5.1)
Sodium: 142 mmol/L (ref 135–145)

## 2019-12-15 LAB — TROPONIN I (HIGH SENSITIVITY)
Troponin I (High Sensitivity): 4 ng/L (ref <18)
Troponin I (High Sensitivity): 5 ng/L (ref <18)

## 2019-12-15 LAB — SARS CORONAVIRUS 2 BY RT PCR (HOSPITAL ORDER, PERFORMED IN ~~LOC~~ HOSPITAL LAB): SARS Coronavirus 2: NEGATIVE

## 2019-12-15 MED ORDER — METHYLPREDNISOLONE SODIUM SUCC 125 MG IJ SOLR
125.0000 mg | Freq: Once | INTRAMUSCULAR | Status: AC
  Administered 2019-12-15: 125 mg via INTRAVENOUS
  Filled 2019-12-15: qty 2

## 2019-12-15 MED ORDER — AZITHROMYCIN 250 MG PO TABS: 250.0000 mg | ORAL_TABLET | Freq: Every day | ORAL | 0 refills | Status: DC

## 2019-12-15 MED ORDER — PREDNISONE 50 MG PO TABS: 50.0000 mg | ORAL_TABLET | Freq: Every day | ORAL | 0 refills | Status: AC

## 2019-12-15 MED ORDER — ALBUTEROL SULFATE HFA 108 (90 BASE) MCG/ACT IN AERS
4.0000 | INHALATION_SPRAY | Freq: Once | RESPIRATORY_TRACT | Status: AC
  Administered 2019-12-15: 4 via RESPIRATORY_TRACT
  Filled 2019-12-15: qty 6.7

## 2019-12-15 NOTE — ED Triage Notes (Signed)
Pt with pressure to mid chest for 2 weeks, seen at Dr. Janna Arch office and is "On verge of a heart attack".  Pt sent here for abnormal EKG.

## 2019-12-15 NOTE — Discharge Instructions (Addendum)
Your shortness of breath and chest tightness in conjunction with your reported fevers and cough could be suggestive of COPD exacerbation.  He was wheezing on my exam.  Your symptoms improved with albuterol inhalers and steroids here in the ED.    Please take your prescribed medications, as directed.  I have placed an ambulatory referral to cardiology.  They should call you in the next few days to schedule an appointment.  Given your family history of cardiac disease, personal past medical history, tobacco use, and chest pain/shortness of breath worse with exertion, concern for angina.  You would benefit from a cardiac stress test.  Please schedule an appointment with cardiology for ongoing evaluation and management.  Given your significant stress and anxiety that appears to be untreated, I would like for you to follow-up with your primary care provider.  You would likely benefit from SSRI management.  You also would benefit significantly from cognitive behavioral therapist.  Please discuss this with your PCP.  You have been tested for COVID-19, please maintain isolation precautions pending results of your testing.  Return to the ER seek immediate medical attention should you experience any new or worsening symptoms.

## 2019-12-15 NOTE — ED Provider Notes (Signed)
[facility] EMERGENCY DEPARTMENT Provider Note   CSN: [id number] Arrival date & time: 12/15/19  1207     History Chief Complaint  Patient presents with   Chest Pain    Leslie Lowery is a 49 y.o. female with PMH significant for HTN and anxiety presents to the ED with a 2-week history of intermittent central chest pain described as "tightness" that is worse with exertion.  She states that she is also endorsing bronchitis symptoms and her usual smoker's cough.  She states that she is short of breath with walking her dog and climbing hills/stairs.  She went to her cardiologist and given this collection of symptoms was advised to come to the ED for chest pain rule out.  Patient endorses extensive family history of cardiac disease, but nobody prior to the age of 78.  She reports that she has been smoking cigarettes since she was 14 and has approximately 50-pack-year smoking history.  She is currently down to 1 pack/day from as high as 2.5.  Her primary care provider noted that she has hyperlipidemia, but I do not see any statin medications.  She does have high blood pressure for which she takes antihypertensives.  Patient reports that she did not want to come to the ED and only came per recommendation of her primary care provider.  She states that her anxiety is "off the charts".  She states that she has been dealing with anxiety since she was 49 years old and was chronically on benzodiazepines.  However, ever since she has been diagnosed with chronic pain syndrome subsequent to degenerative disc disease, she has been taking oxycodone in lieu of of her benzodiazepines.  She reports that she has never seen a therapist or attempted cognitive behavioral therapy.  She also states that she has never been offered SSRI medications or alternatives to benzodiazepines.  She is unsure as to whether or not her anxiety is contributing to her symptoms.  Patient denies any history of clots or clotting disorder,  recent surgeries or immobilizations, oral contraceptives, abdominal pain, nausea or vomiting, syncope, hemoptysis, fevers or chills, or other symptoms.  She has surgical history notable for cholecystectomy last year.  She does not want Maalox administered here in the ED despite initially qualifying her chest pain as "burning" because she is confident that this is not her typical GERD.   Past Medical History:  Diagnosis Date   Anxiety    Disc disease, degenerative, cervical    Hypertension     Patient Active Problem List   Diagnosis Date Noted   Altered mental status 03/21/2019   Acute metabolic encephalopathy 03/21/2019   Right hemiparesis (HCC) 03/21/2019   Tobacco abuse 03/21/2019   Chronic pain syndrome 03/21/2019   Dyspepsia 10/27/2018   RLQ abdominal pain 10/27/2018   Chronic cholecystitis    Fatty liver    Nausea vomiting and diarrhea    RUQ abdominal pain 09/04/2018   Hepatomegaly 09/04/2018   Elevated BP without diagnosis of hypertension 09/04/2018    Past Surgical History:  Procedure Laterality Date   CESAREAN SECTION     X 2   CHOLECYSTECTOMY N/A 09/07/2018   Procedure: LAPAROSCOPIC CHOLECYSTECTOMY;  Surgeon: Lucretia Roers, MD;  Location: [facility];  Service: General;  Laterality: N/A;   LIVER BIOPSY N/A 09/07/2018   Procedure: LAPAROSCOPIC  LIVER BIOPSY;  Surgeon: Lucretia Roers, MD;  Location: [facility];  Service: General;  Laterality: N/A;     OB History    Rhett Bannister  2   Para  2   Term  1   Preterm  1   AB      Living        SAB      TAB      Ectopic      Multiple      Live Births              Family History  Problem Relation Age of Onset   Diabetes Mother    CAD Mother    CAD Father    Diabetes Father    Throat cancer Father    Colon cancer Neg Hx    Colon polyps Neg Hx        no first degree relatives with polyps   Liver disease Neg Hx     Social History   Tobacco Use   Smoking status: Current  Every Day Smoker    Packs/day: 1.00    Types: Cigarettes   Smokeless tobacco: Never Used  Vaping Use   Vaping Use: Never used  Substance Use Topics   Alcohol use: No   Drug use: Yes    Types: Marijuana    Comment: last use was today    Home Medications Prior to Admission medications   Medication Sig Start Date End Date Taking? Authorizing Provider  albuterol (VENTOLIN HFA) 108 (90 Base) MCG/ACT inhaler Inhale 1-2 puffs into the lungs every 6 (six) hours as needed for wheezing or shortness of breath.    [provider]  amphetamine-dextroamphetamine (ADDERALL) 20 MG tablet Take 20 mg by mouth daily. 03/04/19   [provider]  azithromycin (ZITHROMAX) 250 MG tablet Take 1 tablet (250 mg total) by mouth daily. Take first 2 tablets together, then 1 every day until finished. 12/15/19   Lorelee New, PA-C  fluticasone (FLONASE) 50 MCG/ACT nasal spray Place 1 spray into both nostrils daily for 14 days. 04/12/19 04/26/19  Avegno, Zachery Dakins, FNP  oxyCODONE (OXY IR/ROXICODONE) 5 MG immediate release tablet Take 5 mg by mouth 4 (four) times daily. 03/04/19   [provider]  predniSONE (DELTASONE) 50 MG tablet Take 1 tablet (50 mg total) by mouth daily with breakfast for 5 days. 12/15/19 12/20/19  Lorelee New, PA-C  vitamin B-12 (CYANOCOBALAMIN) 500 MCG tablet Take 1 tablet (500 mcg total) by mouth daily. 03/23/19   Catarina Hartshorn, MD    Allergies    Patient has no known allergies.  Review of Systems   Review of Systems  All other systems reviewed and are negative.   Physical Exam Updated Vital Signs BP 134/89 (BP Location: Right Arm)    Pulse 88    Temp 98.7 F (37.1 C) (Oral)    Resp 16    Ht 5\' 3"  (1.6 m)    Wt 104.3 kg    LMP 11/01/2008 (Approximate)    SpO2 97%    BMI 40.74 kg/m   Physical Exam Vitals and nursing note reviewed. Exam conducted with a chaperone present.  Constitutional:      Appearance: She is not diaphoretic.     Comments: Anxious.   Fidgety.  HENT:     Head: Normocephalic and atraumatic.  Eyes:     General: No scleral icterus.    Conjunctiva/sclera: Conjunctivae normal.  Cardiovascular:     Rate and Rhythm: Normal rate and regular rhythm.     Pulses: Normal pulses.     Heart sounds: Normal heart sounds.  Pulmonary:     Comments:  No increased respiratory effort.  No distress.  No accessory muscle use.  Wheezing auscultated diffusely, most notably in upper fields.  No rales.  Speaks in full sentences. Abdominal:     General: Abdomen is flat. There is no distension.     Palpations: Abdomen is soft.     Tenderness: There is no abdominal tenderness. There is no guarding.     Comments: Soft, nondistended.  No areas of tenderness.  Musculoskeletal:     Right lower leg: No edema.     Left lower leg: No edema.     Comments: No unilateral extremity swelling or asymmetries appreciated.  No overlying skin changes.  Skin:    General: Skin is dry.  Neurological:     Mental Status: She is alert.     GCS: GCS eye subscore is 4. GCS verbal subscore is 5. GCS motor subscore is 6.  Psychiatric:        Mood and Affect: Mood normal.        Behavior: Behavior normal.        Thought Content: Thought content normal.     ED Results / Procedures / Treatments   Labs (all labs ordered are listed, but only abnormal results are displayed) Labs Reviewed  BASIC METABOLIC PANEL - Abnormal; Notable for the following components:      Result Value   Potassium 3.4 (*)    Glucose, Bld 101 (*)    All other components within normal limits  SARS CORONAVIRUS 2 BY RT PCR (HOSPITAL ORDER, PERFORMED IN [facility])  CBC  TROPONIN I (HIGH SENSITIVITY)  TROPONIN I (HIGH SENSITIVITY)    EKG EKG Interpretation  Date/Time:  Thursday December 15 2019 12:25:27 EDT Ventricular Rate:  87 PR Interval:  142 QRS Duration: 98 QT Interval:  356 QTC Calculation: 428 R Axis:   -28 Text Interpretation: Normal sinus rhythm Incomplete  right bundle branch block Borderline ECG since last tracing no significant change Confirmed by Mancel Bale [phone]) on 12/15/2019 1:54:01 PM   Radiology DG Chest 2 View  Result Date: 12/15/2019 CLINICAL DATA:  Chest pain EXAM: CHEST - 2 VIEW COMPARISON:  03/21/2019 chest radiograph and prior. FINDINGS: The heart size and mediastinal contours are within normal limits. Both lungs are clear. No pneumothorax or pleural effusion. No acute osseous abnormality. IMPRESSION: No focal airspace disease. Electronically Signed   By: Stana Bunting M.D.   On: 12/15/2019 12:55    Procedures Procedures (including critical care time)  Medications Ordered in ED Medications  albuterol (VENTOLIN HFA) 108 (90 Base) MCG/ACT inhaler 4 puff (4 puffs Inhalation Given 12/15/19 1937)  methylPREDNISolone sodium succinate (SOLU-MEDROL) 125 mg/2 mL injection 125 mg (125 mg Intravenous Given 12/15/19 1937)    ED Course  I have reviewed the triage vital signs and the nursing notes.  Pertinent labs & imaging results that were available during my care of the patient were reviewed by me and considered in my medical decision making (see chart for details).    MDM Rules/Calculators/A&P HEAR Score: 5                        Patient presents the ED with a 2-week history of intermittent chest "tightness" pain with associated shortness of breath and "bronchitic symptoms".  She is actively coughing throughout my examination, but attributes it to her usual smoker's cough.  She has not been immunized for COVID-19.  No obvious sick contacts.  She denies any other  symptoms of systemic illness.  Labs Troponin: 4 >> 5. BMP: Mild hypokalemia to 3.4.  Otherwise unremarkable. CBC: No anemia.  No leukocytosis concerning for infection.  I personally reviewed EKG which demonstrates NSR with incomplete RBBB with no significant changes when compared to prior tracings.  I also compared this to the EKG provided by primary care provider  which has significant baseline wander, but is otherwise similar.  Chest x-ray 2 view is reviewed and demonstrates no acute cardiopulmonary findings.  Patient has wheezing auscultated diffusely.  She states that her PCP did not listen to her lungs.  I suspect that this can be a COPD exacerbation.  We will treat with albuterol and Solu-Medrol 125 mg IM.    On subsequent evaluation, patient is feeling as though her symptoms of shortness of breath and chest discomfort have improved with and 25 mg IV Solu-Medrol and 4 puffs albuterol.  She does tell me that her daughter also has been sick with bronchitis and that she herself had a mild fever about 1 week ago.  She is also endorsing congestion symptoms.  Will obtain COVID-19 testing.  She is unvaccinated.  Fortunately, her vital signs are stable here in the ED and she is not demonstrating any increased work of breathing.  Given patient's cardiac risk factors and that angina cannot be excluded as cause of her chest pain and shortness of breath symptoms, I will place ambulatory referral for cardiology.  HEAR Score 5.  She would benefit from stress testing.  Patient also has significant anxiety which she states has been uncontrolled since she was removed from her benzodiazepines and instead placed on oxycodone chronically for her degenerative disc disease.  She states that she has not tried SSRI management or has she seen a cognitive behavioral therapist.  I am encouraging her to bring this up with her primary care provider on follow-up appointment to pursue therapy as management strategy.  I discussed this patient with Dr. Hyacinth MeekerMiller who agrees with assessment and plan.  Strict ED return precautions discussed.  All of the evaluation and work-up results were discussed with the patient and any family at bedside.  Patient and/or family were informed that while patient is appropriate for discharge at this time, some medical emergencies may only develop or become  detectable after a period of time.  I specifically instructed patient and/or family to return to return to the ED or seek immediate medical attention for any new or worsening symptoms.  They were provided opportunity to ask any additional questions and have none at this time.  Prior to discharge patient is feeling well, agreeable with plan for discharge home.  They have expressed understanding of verbal discharge instructions as well as return precautions and are agreeable to the plan.   Quentin MullingKimberly D Stallsmith was evaluated in Emergency Department on 12/15/2019 for the symptoms described in the history of present illness. She was evaluated in the context of the global COVID-19 pandemic, which necessitated consideration that the patient might be at risk for infection with the SARS-CoV-2 virus that causes COVID-19. Institutional protocols and algorithms that pertain to the evaluation of patients at risk for COVID-19 are in a state of rapid change based on information released by regulatory bodies including the CDC and federal and state organizations. These policies and algorithms were followed during the patient's care in the ED.   Final Clinical Impression(s) / ED Diagnoses Final diagnoses:  Chronic obstructive pulmonary disease with acute exacerbation (HCC)    Rx / DC  Orders ED Discharge Orders         Ordered    azithromycin (ZITHROMAX) 250 MG tablet  Daily        12/15/19 2054    predniSONE (DELTASONE) 50 MG tablet  Daily with breakfast        12/15/19 2054    Ambulatory referral to Cardiology        12/15/19 2054           Lorelee New, PA-C 12/15/19 3559    Eber Hong, MD 12/23/19 [phone]

## 2020-01-12 DIAGNOSIS — K219 Gastro-esophageal reflux disease without esophagitis: Secondary | ICD-10-CM | POA: Diagnosis not present

## 2020-01-12 DIAGNOSIS — R5382 Chronic fatigue, unspecified: Secondary | ICD-10-CM | POA: Diagnosis not present

## 2020-01-12 DIAGNOSIS — E7849 Other hyperlipidemia: Secondary | ICD-10-CM | POA: Diagnosis not present

## 2020-01-12 DIAGNOSIS — I119 Hypertensive heart disease without heart failure: Secondary | ICD-10-CM | POA: Diagnosis not present

## 2020-02-07 DIAGNOSIS — G8929 Other chronic pain: Secondary | ICD-10-CM | POA: Diagnosis not present

## 2020-02-07 DIAGNOSIS — K219 Gastro-esophageal reflux disease without esophagitis: Secondary | ICD-10-CM | POA: Diagnosis not present

## 2020-03-08 DIAGNOSIS — G8929 Other chronic pain: Secondary | ICD-10-CM | POA: Diagnosis not present

## 2020-03-08 DIAGNOSIS — J449 Chronic obstructive pulmonary disease, unspecified: Secondary | ICD-10-CM | POA: Diagnosis not present

## 2020-03-08 DIAGNOSIS — M5459 Other low back pain: Secondary | ICD-10-CM | POA: Diagnosis not present

## 2020-04-05 DIAGNOSIS — M5459 Other low back pain: Secondary | ICD-10-CM | POA: Diagnosis not present

## 2020-04-05 DIAGNOSIS — M2559 Pain in other specified joint: Secondary | ICD-10-CM | POA: Diagnosis not present

## 2020-04-05 DIAGNOSIS — G8929 Other chronic pain: Secondary | ICD-10-CM | POA: Diagnosis not present

## 2020-05-03 DIAGNOSIS — M5459 Other low back pain: Secondary | ICD-10-CM | POA: Diagnosis not present

## 2020-05-03 DIAGNOSIS — M2550 Pain in unspecified joint: Secondary | ICD-10-CM | POA: Diagnosis not present

## 2020-05-03 DIAGNOSIS — E662 Morbid (severe) obesity with alveolar hypoventilation: Secondary | ICD-10-CM | POA: Diagnosis not present

## 2020-05-03 DIAGNOSIS — R5382 Chronic fatigue, unspecified: Secondary | ICD-10-CM | POA: Diagnosis not present

## 2020-05-16 DIAGNOSIS — F419 Anxiety disorder, unspecified: Secondary | ICD-10-CM | POA: Diagnosis not present

## 2020-05-16 DIAGNOSIS — R0602 Shortness of breath: Secondary | ICD-10-CM | POA: Diagnosis not present

## 2020-05-16 DIAGNOSIS — R079 Chest pain, unspecified: Secondary | ICD-10-CM | POA: Diagnosis not present

## 2020-05-16 DIAGNOSIS — I700 Atherosclerosis of aorta: Secondary | ICD-10-CM | POA: Diagnosis not present

## 2020-05-16 DIAGNOSIS — I4510 Unspecified right bundle-branch block: Secondary | ICD-10-CM | POA: Diagnosis not present

## 2020-05-16 DIAGNOSIS — Z79899 Other long term (current) drug therapy: Secondary | ICD-10-CM | POA: Diagnosis not present

## 2020-05-31 DIAGNOSIS — M5459 Other low back pain: Secondary | ICD-10-CM | POA: Diagnosis not present

## 2020-05-31 DIAGNOSIS — E7849 Other hyperlipidemia: Secondary | ICD-10-CM | POA: Diagnosis not present

## 2020-05-31 DIAGNOSIS — M1990 Unspecified osteoarthritis, unspecified site: Secondary | ICD-10-CM | POA: Diagnosis not present

## 2020-05-31 DIAGNOSIS — G894 Chronic pain syndrome: Secondary | ICD-10-CM | POA: Diagnosis not present

## 2020-05-31 DIAGNOSIS — Z7409 Other reduced mobility: Secondary | ICD-10-CM | POA: Diagnosis not present

## 2020-06-28 DIAGNOSIS — M5459 Other low back pain: Secondary | ICD-10-CM | POA: Diagnosis not present

## 2020-06-28 DIAGNOSIS — E7849 Other hyperlipidemia: Secondary | ICD-10-CM | POA: Diagnosis not present

## 2020-06-28 DIAGNOSIS — F900 Attention-deficit hyperactivity disorder, predominantly inattentive type: Secondary | ICD-10-CM | POA: Diagnosis not present

## 2020-07-26 DIAGNOSIS — G8929 Other chronic pain: Secondary | ICD-10-CM | POA: Diagnosis not present

## 2020-07-26 DIAGNOSIS — M5459 Other low back pain: Secondary | ICD-10-CM | POA: Diagnosis not present

## 2020-07-26 DIAGNOSIS — K219 Gastro-esophageal reflux disease without esophagitis: Secondary | ICD-10-CM | POA: Diagnosis not present

## 2020-08-29 DIAGNOSIS — F17210 Nicotine dependence, cigarettes, uncomplicated: Secondary | ICD-10-CM | POA: Diagnosis not present

## 2020-08-29 DIAGNOSIS — F419 Anxiety disorder, unspecified: Secondary | ICD-10-CM | POA: Diagnosis not present

## 2020-08-29 DIAGNOSIS — M25561 Pain in right knee: Secondary | ICD-10-CM | POA: Diagnosis not present

## 2020-08-29 DIAGNOSIS — M25461 Effusion, right knee: Secondary | ICD-10-CM | POA: Diagnosis not present

## 2020-08-29 DIAGNOSIS — J449 Chronic obstructive pulmonary disease, unspecified: Secondary | ICD-10-CM | POA: Diagnosis not present

## 2020-08-31 ENCOUNTER — Telehealth: Payer: Self-pay

## 2020-08-31 NOTE — Telephone Encounter (Signed)
Transition Care Management Unsuccessful Follow-up Telephone Call  Date of discharge and from where:  [facility]  Attempts:  1st Attempt  Reason for unsuccessful TCM follow-up call:  Unable to reach patient

## 2020-09-03 NOTE — Telephone Encounter (Signed)
Transition Care Management Unsuccessful Follow-up Telephone Call  Date of discharge and from where:  08/30/2020 from [facility]  Attempts:  2nd Attempt  Reason for unsuccessful TCM follow-up call:  Unable to leave message

## 2020-09-04 NOTE — Telephone Encounter (Signed)
Transition Care Management Unsuccessful Follow-up Telephone Call  Date of discharge and from where:  08/30/2020 from [facility]   Attempts:  3rd Attempt  Reason for unsuccessful TCM follow-up call:  Unable to reach patient

## 2020-09-20 DIAGNOSIS — E7849 Other hyperlipidemia: Secondary | ICD-10-CM | POA: Diagnosis not present

## 2020-09-20 DIAGNOSIS — F902 Attention-deficit hyperactivity disorder, combined type: Secondary | ICD-10-CM | POA: Diagnosis not present

## 2020-09-20 DIAGNOSIS — I110 Hypertensive heart disease with heart failure: Secondary | ICD-10-CM | POA: Diagnosis not present

## 2020-12-17 DIAGNOSIS — J40 Bronchitis, not specified as acute or chronic: Secondary | ICD-10-CM | POA: Diagnosis not present

## 2021-01-01 DIAGNOSIS — R079 Chest pain, unspecified: Secondary | ICD-10-CM | POA: Diagnosis not present

## 2021-01-01 DIAGNOSIS — N644 Mastodynia: Secondary | ICD-10-CM | POA: Diagnosis not present

## 2021-01-01 DIAGNOSIS — R0789 Other chest pain: Secondary | ICD-10-CM | POA: Diagnosis not present

## 2021-01-01 DIAGNOSIS — M79622 Pain in left upper arm: Secondary | ICD-10-CM | POA: Diagnosis not present

## 2021-01-01 DIAGNOSIS — J449 Chronic obstructive pulmonary disease, unspecified: Secondary | ICD-10-CM | POA: Diagnosis not present

## 2021-01-01 DIAGNOSIS — Z9049 Acquired absence of other specified parts of digestive tract: Secondary | ICD-10-CM | POA: Diagnosis not present

## 2021-01-01 DIAGNOSIS — I700 Atherosclerosis of aorta: Secondary | ICD-10-CM | POA: Diagnosis not present

## 2021-01-01 DIAGNOSIS — R519 Headache, unspecified: Secondary | ICD-10-CM | POA: Diagnosis not present

## 2021-01-01 DIAGNOSIS — N6320 Unspecified lump in the left breast, unspecified quadrant: Secondary | ICD-10-CM | POA: Diagnosis not present

## 2021-01-01 DIAGNOSIS — F419 Anxiety disorder, unspecified: Secondary | ICD-10-CM | POA: Diagnosis not present

## 2021-01-01 DIAGNOSIS — F17210 Nicotine dependence, cigarettes, uncomplicated: Secondary | ICD-10-CM | POA: Diagnosis not present

## 2021-01-01 DIAGNOSIS — J9811 Atelectasis: Secondary | ICD-10-CM | POA: Diagnosis not present

## 2021-01-01 DIAGNOSIS — R9431 Abnormal electrocardiogram [ECG] [EKG]: Secondary | ICD-10-CM | POA: Diagnosis not present

## 2021-01-01 DIAGNOSIS — I517 Cardiomegaly: Secondary | ICD-10-CM | POA: Diagnosis not present

## 2021-01-02 ENCOUNTER — Telehealth: Payer: Self-pay

## 2021-01-02 NOTE — Telephone Encounter (Signed)
Transition Care Management Unsuccessful Follow-up Telephone Call  Date of discharge and from where:  01/01/2021-[facility]  Attempts:  1st Attempt  Reason for unsuccessful TCM follow-up call:  Unable to reach patient

## 2021-01-03 NOTE — Telephone Encounter (Signed)
Transition Care Management Unsuccessful Follow-up Telephone Call  Date of discharge and from where:  01/01/2021 from [facility]  Attempts:  2nd Attempt  Reason for unsuccessful TCM follow-up call:  Unable to reach patient    

## 2021-01-04 NOTE — Telephone Encounter (Signed)
Transition Care Management Unsuccessful Follow-up Telephone Call  Date of discharge and from where:  01/01/2021 from [facility]  Attempts:  3rd Attempt  Reason for unsuccessful TCM follow-up call:  Unable to reach patient    

## 2021-01-07 IMAGING — CT CT ABD-PELV W/O CM
2 of 3 series · 16 of 46 positions shown, 18 images · non-contrast
Comparison: 02/28/2019 ultrasound of 03/21/2019

CLINICAL DATA: Right-sided abdominal pain. Cholecystectomy in August 2018. Nausea and vomiting.

EXAM:
CT ABDOMEN AND PELVIS WITHOUT CONTRAST
TECHNIQUE: Multidetector CT imaging of the abdomen and pelvis was performed
following the standard protocol without IV contrast.

[Series 2: axial st · axial · 0.87mm/px · z∈[-446,-41]mm · 13 of 94 slices shown, 15 images]
[im 7/94  soft-tissue]
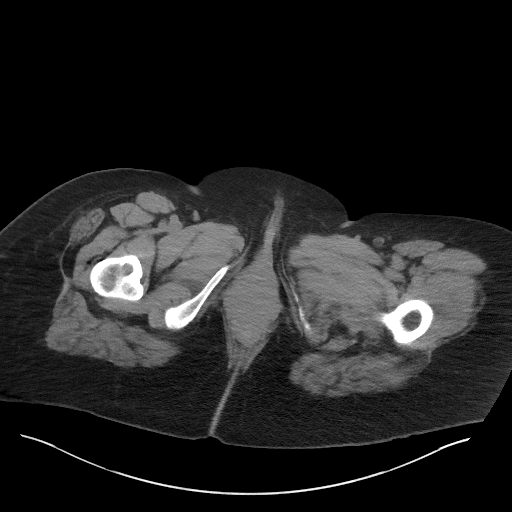
[im 7/94  bone]
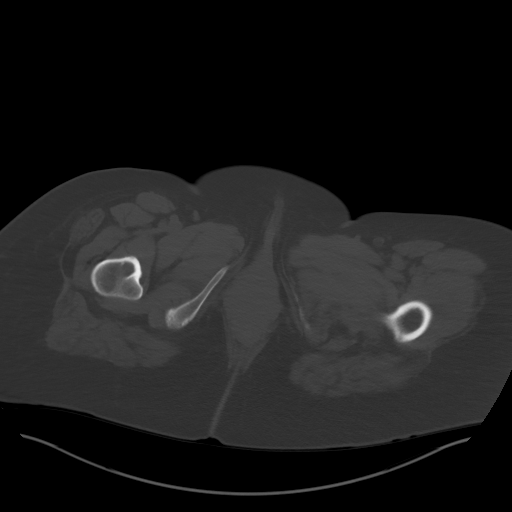
[im 13/94  soft-tissue]
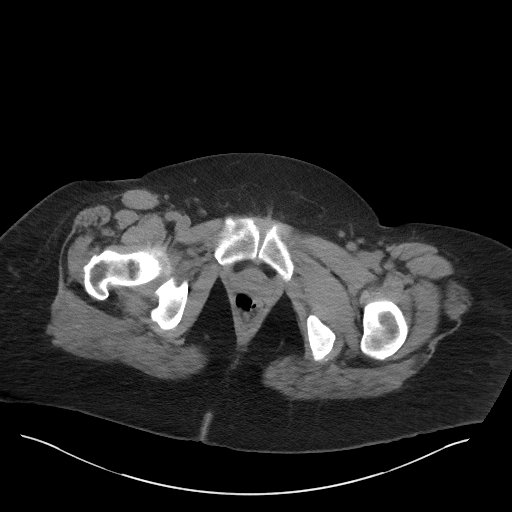
[im 19/94  soft-tissue]
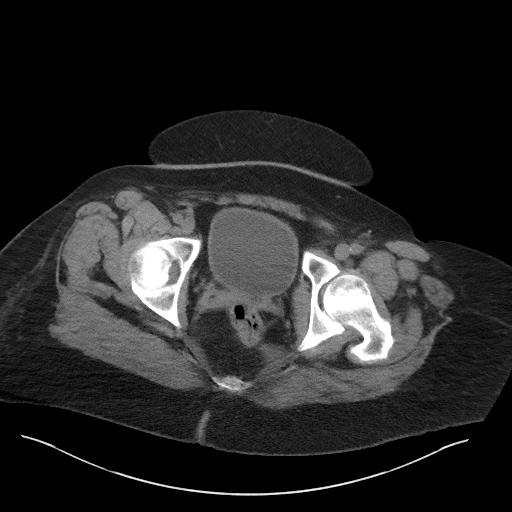
[im 28/94  soft-tissue]
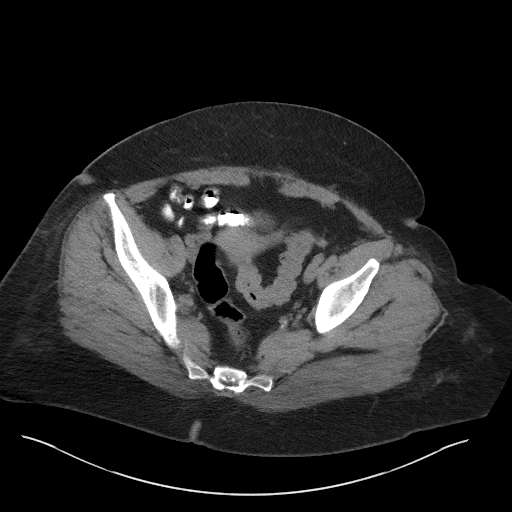
[im 34/94  soft-tissue]
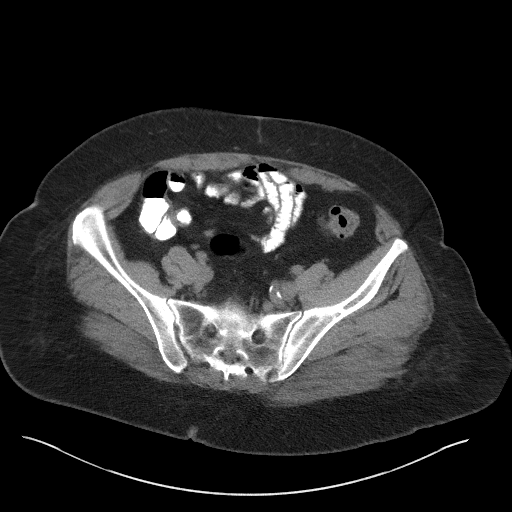
[im 40/94  soft-tissue]
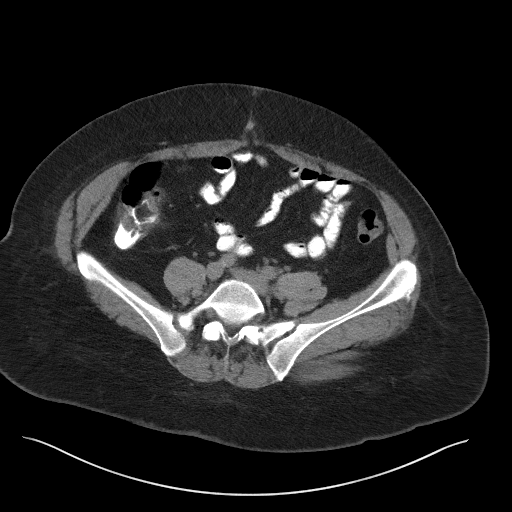
[im 49/94  soft-tissue]
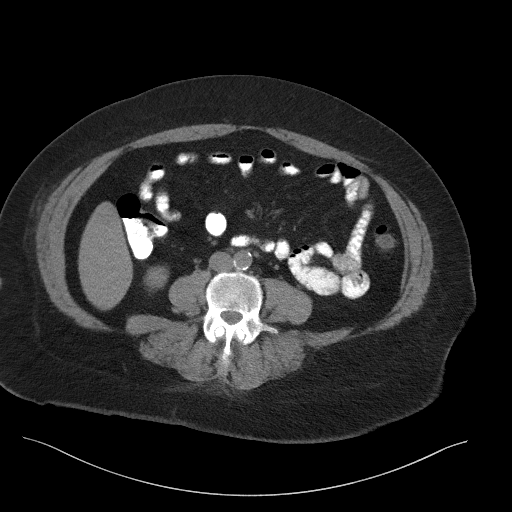
[im 55/94  soft-tissue]
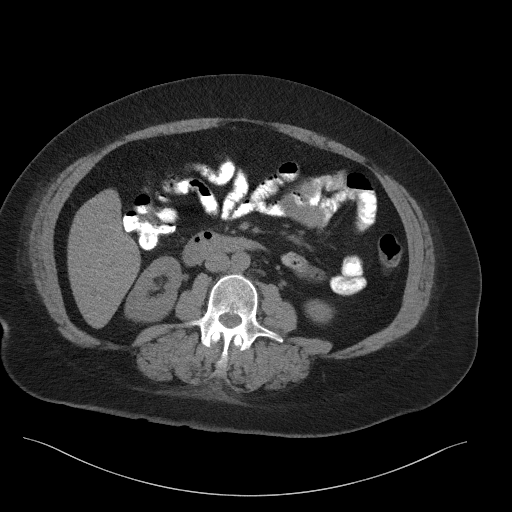
[im 61/94  soft-tissue]
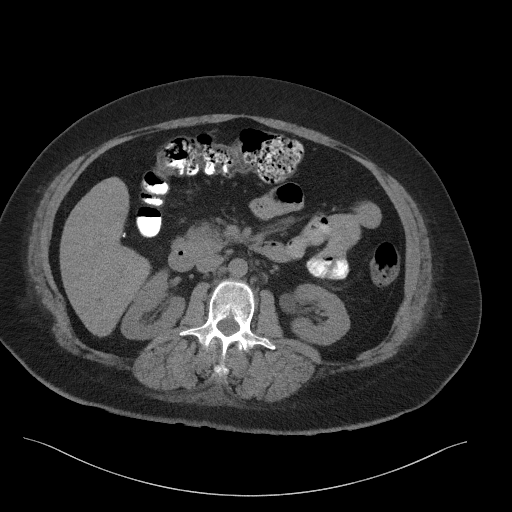
[im 61/94  bone]
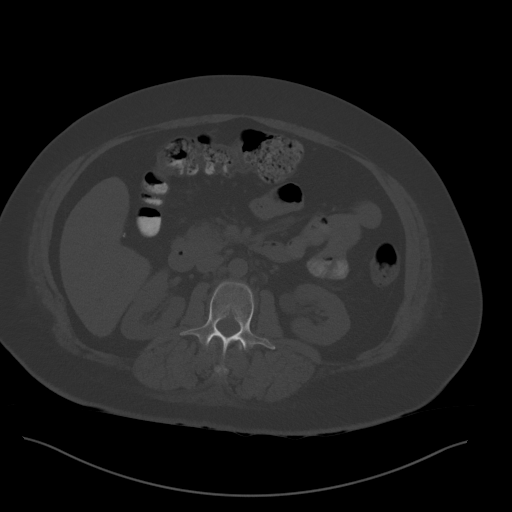
[im 67/94  soft-tissue]
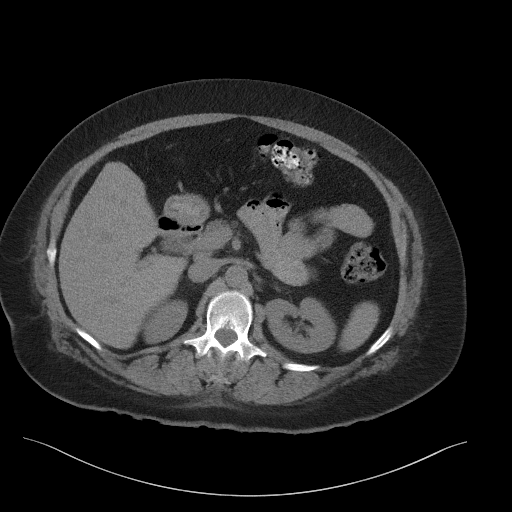
[im 76/94  soft-tissue]
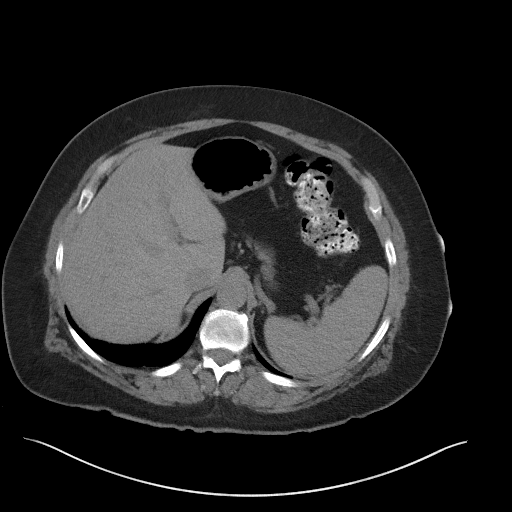
[im 82/94  soft-tissue]
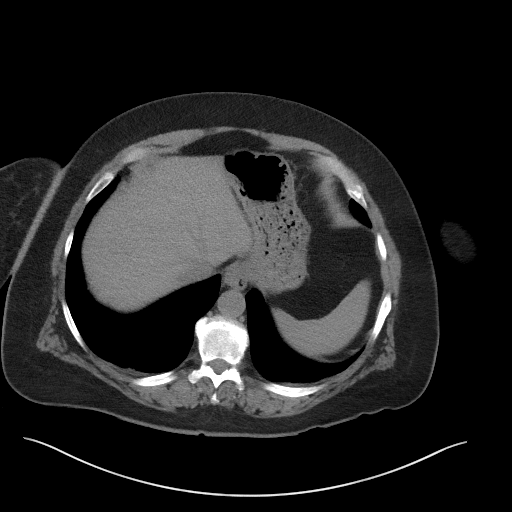
[im 88/94  soft-tissue]
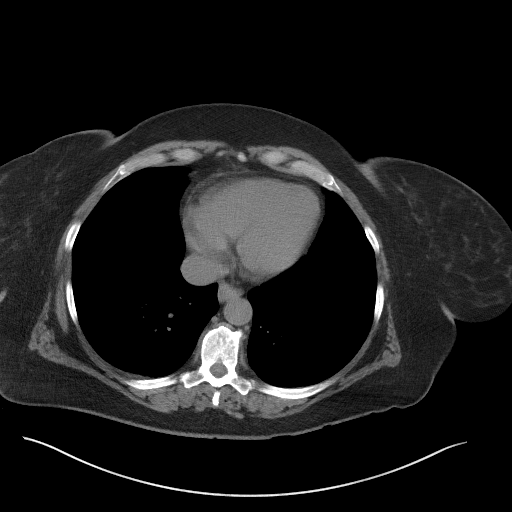

[Series 5: coronal st · coronal · 0.79mm/px · 3 of 102 slices shown]
[im 34/102  soft-tissue]
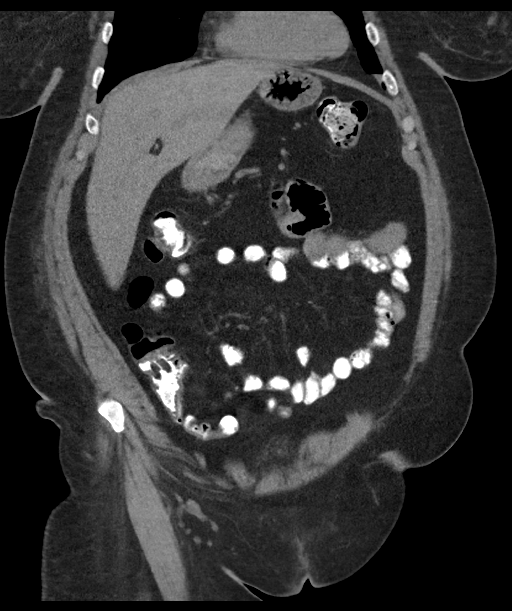
[im 45/102  soft-tissue]
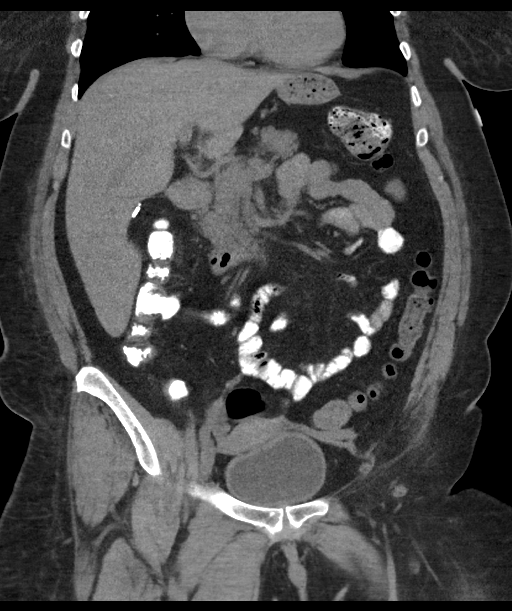
[im 57/102  soft-tissue]
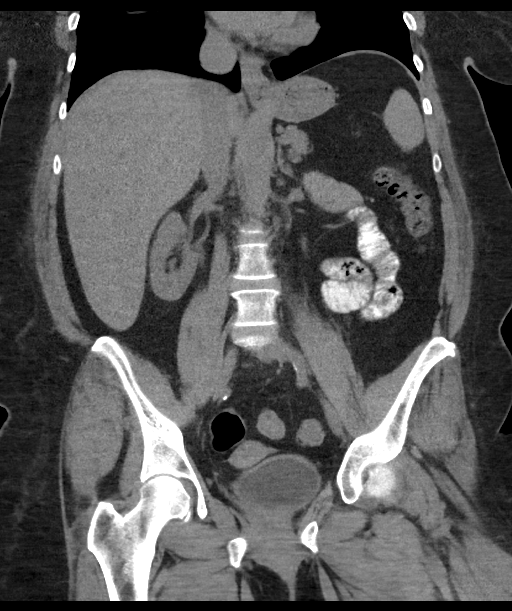

[16 of 46 positions shown; findings below may reference images not displayed]

FINDINGS: Lower chest: Airway thickening is present, suggesting bronchitis or
reactive airways disease. Subsegmental atelectasis in both lower
lobes.

Hepatobiliary: Cholecystectomy. Previous hypodensity along the
anterior aspect of segment 5 of the liver is markedly reduced in
size, currently only about 0.4 cm in diameter, previously 1.4 cm in
diameter, likely a small residua from prior cholecystectomy.

Pancreas: Unremarkable

Spleen: Unremarkable

Adrenals/Urinary Tract: 2 mm left mid kidney nonobstructive renal
calculus on image 35/5. Otherwise unremarkable.

Stomach/Bowel: Upper normal size of the proximal appendix at 6 mm in
diameter, with completely normal appearing distal appendix, this
appearance is probably incidental.

Vascular/Lymphatic: Aortoiliac atherosclerotic vascular disease.

Reproductive: Unremarkable

Other: No supplemental non-categorized findings.

Musculoskeletal: Facet arthropathy and degenerative disc disease at
L4-5 likely resulting in bilateral mild foraminal impingement.

Small umbilical hernia contains adipose tissue, images 72-73 of
series 6.
IMPRESSION: 1. A cause for the patient's right-sided abdominal pain is not
identified.
2. Airway thickening is present, suggesting bronchitis or reactive
airways disease.
3. Other imaging findings of potential clinical significance:
Nonobstructive left nephrolithiasis. Small umbilical hernia contains
adipose tissue. Lower lumbar spondylosis and degenerative disc
disease causing mild foraminal impingement at L4-5.

Aortic Atherosclerosis ([id number].Z).

## 2021-01-07 IMAGING — DX DG CHEST 2V
2 series · 2 of 2 positions shown · non-contrast
Comparison: 09/16/2018

CLINICAL DATA: Unresponsive with slurred speech

EXAM:
CHEST - 2 VIEW

[chest lat]
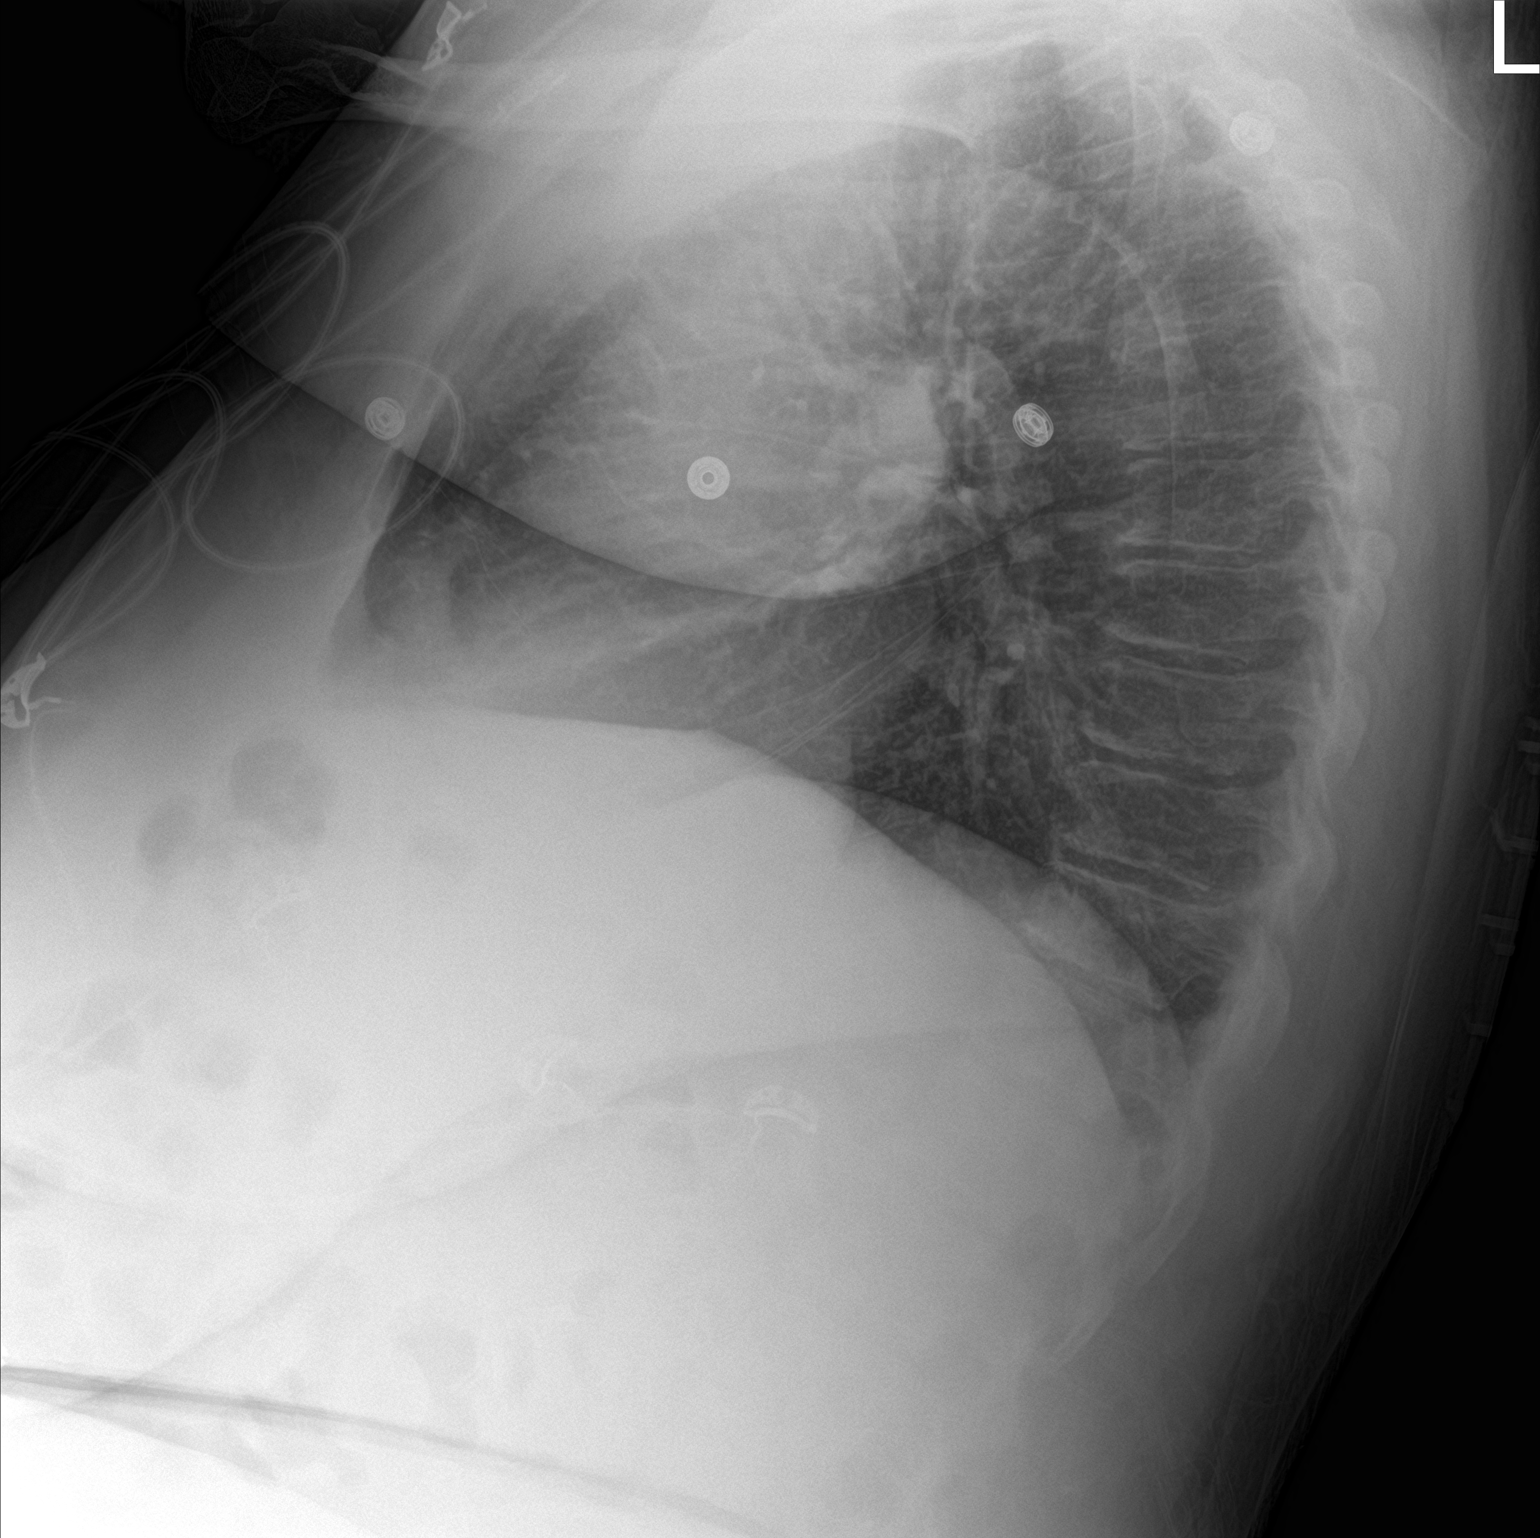

[chest ap]
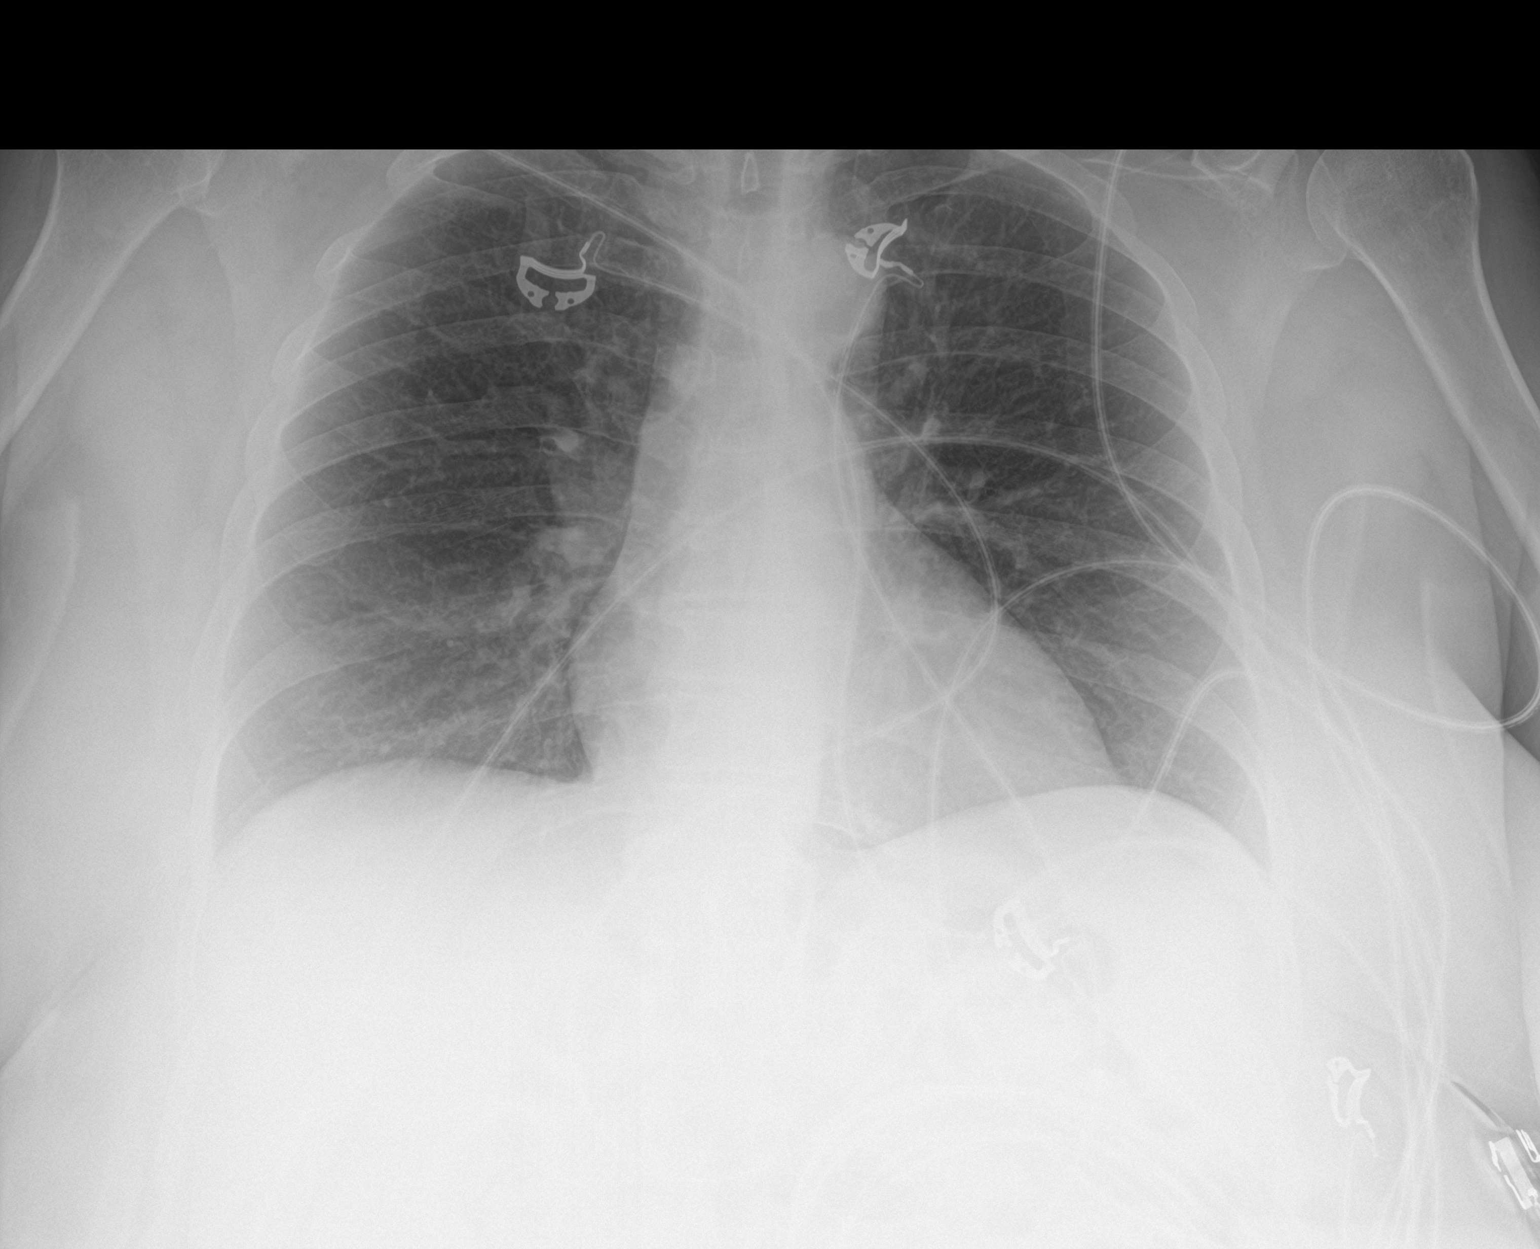

[2 of 2 positions shown; findings below may reference images not displayed]

FINDINGS: Artifact from EKG leads. There is no edema, consolidation, effusion,
or pneumothorax. Normal heart size and mediastinal contours.
IMPRESSION: Negative chest.

## 2021-03-11 DIAGNOSIS — J111 Influenza due to unidentified influenza virus with other respiratory manifestations: Secondary | ICD-10-CM | POA: Diagnosis not present

## 2021-03-11 DIAGNOSIS — J029 Acute pharyngitis, unspecified: Secondary | ICD-10-CM | POA: Diagnosis not present

## 2021-03-11 DIAGNOSIS — J209 Acute bronchitis, unspecified: Secondary | ICD-10-CM | POA: Diagnosis not present

## 2021-03-11 DIAGNOSIS — J019 Acute sinusitis, unspecified: Secondary | ICD-10-CM | POA: Diagnosis not present

## 2021-04-16 ENCOUNTER — Encounter: Payer: Self-pay | Admitting: Internal Medicine

## 2021-04-16 ENCOUNTER — Other Ambulatory Visit: Payer: Self-pay

## 2021-04-16 ENCOUNTER — Ambulatory Visit: Payer: Medicaid Other | Admitting: Internal Medicine

## 2021-04-16 VITALS — BP 138/102 | HR 92 | Resp 18 | Ht 63.0 in | Wt 241.0 lb

## 2021-04-16 DIAGNOSIS — F909 Attention-deficit hyperactivity disorder, unspecified type: Secondary | ICD-10-CM

## 2021-04-16 DIAGNOSIS — G894 Chronic pain syndrome: Secondary | ICD-10-CM | POA: Diagnosis not present

## 2021-04-16 DIAGNOSIS — Z720 Tobacco use: Secondary | ICD-10-CM

## 2021-04-16 DIAGNOSIS — M48062 Spinal stenosis, lumbar region with neurogenic claudication: Secondary | ICD-10-CM | POA: Diagnosis not present

## 2021-04-16 DIAGNOSIS — I10 Essential (primary) hypertension: Secondary | ICD-10-CM | POA: Diagnosis not present

## 2021-04-16 DIAGNOSIS — F331 Major depressive disorder, recurrent, moderate: Secondary | ICD-10-CM

## 2021-04-16 DIAGNOSIS — Z2821 Immunization not carried out because of patient refusal: Secondary | ICD-10-CM | POA: Diagnosis not present

## 2021-04-16 DIAGNOSIS — Z1231 Encounter for screening mammogram for malignant neoplasm of breast: Secondary | ICD-10-CM

## 2021-04-16 DIAGNOSIS — Z1211 Encounter for screening for malignant neoplasm of colon: Secondary | ICD-10-CM

## 2021-04-16 MED ORDER — CITALOPRAM HYDROBROMIDE 10 MG PO TABS: 10.0000 mg | ORAL_TABLET | Freq: Every day | ORAL | 2 refills | Status: DC

## 2021-04-16 MED ORDER — OXYCODONE HCL 5 MG PO TABS: 5.0000 mg | ORAL_TABLET | Freq: Three times a day (TID) | ORAL | 0 refills | Status: DC

## 2021-04-16 MED ORDER — LOSARTAN POTASSIUM 25 MG PO TABS: 25.0000 mg | ORAL_TABLET | Freq: Every day | ORAL | 0 refills | Status: DC

## 2021-04-16 MED ORDER — AMPHETAMINE-DEXTROAMPHETAMINE 20 MG PO TABS: 20.0000 mg | ORAL_TABLET | Freq: Every day | ORAL | 0 refills | Status: DC

## 2021-04-16 NOTE — Patient Instructions (Signed)
Please continue taking medications as prescribed.  Please start taking Losartan as prescribed.  Please follow low salt diet and ambulate as tolerated.

## 2021-04-17 ENCOUNTER — Telehealth: Payer: Self-pay | Admitting: Internal Medicine

## 2021-04-17 MED ORDER — AMPHETAMINE-DEXTROAMPHETAMINE 20 MG PO TABS: 20.0000 mg | ORAL_TABLET | Freq: Every day | ORAL | 0 refills | Status: DC

## 2021-04-17 NOTE — Telephone Encounter (Signed)
Pt LVM in regard to   amphetamine-dextroamphetamine (ADDERALL) 20 MG tablet   Current pharm does not have med in stock   Pt wants send to [facility], [place]  Pharm has med in stock

## 2021-04-17 NOTE — Telephone Encounter (Signed)
Pt has sent mychart message and this has been sent to provider awaiting his response

## 2021-04-19 ENCOUNTER — Encounter: Payer: Self-pay | Admitting: Internal Medicine

## 2021-04-19 DIAGNOSIS — M48062 Spinal stenosis, lumbar region with neurogenic claudication: Secondary | ICD-10-CM | POA: Insufficient documentation

## 2021-04-19 DIAGNOSIS — F909 Attention-deficit hyperactivity disorder, unspecified type: Secondary | ICD-10-CM | POA: Insufficient documentation

## 2021-04-19 DIAGNOSIS — F331 Major depressive disorder, recurrent, moderate: Secondary | ICD-10-CM | POA: Insufficient documentation

## 2021-04-19 NOTE — Assessment & Plan Note (Signed)
Was managed by Dr. Cindie Laroche  Due to underlying DDD of lumbar spine Has been evaluated by spine surgeon, patient did not want any surgical intervention Currently in severe pain Refilled oxycodone 5 mg 3 times daily as needed for now, advised patient that I do not do chronic pain management and to look for a new PCP, who would be willing to do pain management and ADHD management as well

## 2021-04-19 NOTE — Assessment & Plan Note (Addendum)
Used to take Adderall 20 mg daily Refilled for now Advised patient to look for a new PCP who would be willing to do ADHD and chronic pain management, patient is in agreement.

## 2021-04-19 NOTE — Assessment & Plan Note (Signed)
BP Readings from Last 1 Encounters:  04/16/21 (!) 138/102   New onset Started Losartan 25 mg QD, check BMP after 2 weeks Counseled for compliance with the medications Advised DASH diet and moderate exercise/walking, at least 150 mins/week

## 2021-04-19 NOTE — Assessment & Plan Note (Signed)
Smokes about 1 pack/day  Asked about quitting: confirms that he/she currently smokes cigarettes Advise to quit smoking: Educated about QUITTING to reduce the risk of cancer, cardio and cerebrovascular disease. Assess willingness: Unwilling to quit at this time, but is working on cutting back. Assist with counseling and pharmacotherapy: Counseled for 5 minutes and literature provided. Arrange for follow up: follow up and continue to offer help.

## 2021-04-19 NOTE — Assessment & Plan Note (Signed)
Was on Celexa, refilled

## 2021-04-19 NOTE — Progress Notes (Signed)
New Patient Office Visit  Subjective:  Patient ID: Leslie Lowery, female    DOB: [birth year]  Age: 51 y.o. MRN: [id number]  CC:  Chief Complaint  Patient presents with   New Patient (Initial Visit)    New patient was seeing dr Cindie Laroche just establishing care does have high blood pressure but has not been able to get medication for this pt also take adderall and oxycodone that was being filled by [facility] and has not had this medication since November     HPI Leslie Lowery is a 51 y.o. female with past medical history of HTN, chronic pain syndrome secondary to lumbar spinal stenosis, MDD, ADHD, morbid obesity and tobacco abuse who presents for establishing care.  HTN: Her BP was elevated in the office today upon multiple measurements.  She states that her BP remains elevated most of the time, even during her previous office visits with her previous PCP.  She reports mild, generalized headache and dizziness.  She denies any chest pain, dyspnea or palpitations.  She has history of lumbar spinal stenosis, for which she has been taking chronic opioids, prescribed by Dr. Cindie Laroche.  She also has chronic, intermittent numbness and weakness of the LE.  I advised patient that I do not do chronic pain management and to find a new PCP who would agree to chronic pain management with opioids.  I told her that I would refill her Oxycodone for now, but only 3 times daily instead of QID dosing in the past.  She has run out of her oxycodone for the last 2 months, and has been in severe pain currently.  She denies any recent injury or fall.  Denies any saddle anesthesia, urinary or stool incontinence.  She also has a history of ADHD and used to take Adderall, which I have agreed to refill for now.  She has been having agitation, inattention and insomnia recently.  Of note, she was also on Celexa for MDD.  She denies any SI or HI currently.   Past Medical History:  Diagnosis Date   Anxiety    Disc  disease, degenerative, cervical    Hypertension     Past Surgical History:  Procedure Laterality Date   CESAREAN SECTION     X 2   CHOLECYSTECTOMY N/A 09/07/2018   Procedure: LAPAROSCOPIC CHOLECYSTECTOMY;  Surgeon: Virl Cagey, MD;  Location: [facility];  Service: General;  Laterality: N/A;   LIVER BIOPSY N/A 09/07/2018   Procedure: LAPAROSCOPIC  LIVER BIOPSY;  Surgeon: Virl Cagey, MD;  Location: [facility];  Service: General;  Laterality: N/A;    Family History  Problem Relation Age of Onset   Diabetes Mother    CAD Mother    CAD Father    Diabetes Father    Throat cancer Father    Colon cancer Neg Hx    Colon polyps Neg Hx        no first degree relatives with polyps   Liver disease Neg Hx     Social History   Socioeconomic History   Marital status: Single    Spouse name: Not on file   Number of children: Not on file   Years of education: Not on file   Highest education level: Not on file  Occupational History   Not on file  Tobacco Use   Smoking status: Every Day    Packs/day: 1.00    Types: Cigarettes   Smokeless tobacco: Never  Vaping Use   Vaping  Use: Never used  Substance and Sexual Activity   Alcohol use: No   Drug use: Yes    Types: Marijuana    Comment: last use was today   Sexual activity: Not on file  Other Topics Concern   Not on file  Social History Narrative   Not on file   Social Determinants of Health   Financial Resource Strain: Not on file  Food Insecurity: Not on file  Transportation Needs: Not on file  Physical Activity: Not on file  Stress: Not on file  Social Connections: Not on file  Intimate Partner Violence: Not on file    ROS Review of Systems  Constitutional:  Positive for fatigue. Negative for chills and fever.  HENT:  Negative for congestion, sinus pressure and sinus pain.   Eyes:  Negative for pain and discharge.  Respiratory:  Negative for cough and shortness of breath.   Cardiovascular:  Negative for chest  pain and palpitations.  Gastrointestinal:  Negative for diarrhea, nausea and vomiting.  Genitourinary:  Negative for dysuria and hematuria.  Musculoskeletal:  Positive for arthralgias, back pain and neck pain. Negative for neck stiffness.  Skin:  Negative for rash.  Neurological:  Negative for dizziness and weakness.  Psychiatric/Behavioral:  Positive for agitation, decreased concentration, dysphoric mood and sleep disturbance. Negative for behavioral problems. The patient is nervous/anxious.    Objective:   Today's Vitals: BP (!) 138/102 (BP Location: Left Arm, Cuff Size: Normal)    Pulse 92    Resp 18    Ht 5\' 3"  (1.6 m)    Wt 241 lb (109.3 kg)    LMP 11/01/2008 (Approximate)    SpO2 96%    BMI 42.69 kg/m   Physical Exam Vitals reviewed.  Constitutional:      General: She is not in acute distress.    Appearance: She is obese. She is not diaphoretic.  HENT:     Head: Normocephalic and atraumatic.     Nose: Nose normal.     Mouth/Throat:     Mouth: Mucous membranes are moist.  Eyes:     General: No scleral icterus.    Extraocular Movements: Extraocular movements intact.  Cardiovascular:     Rate and Rhythm: Normal rate and regular rhythm.     Pulses: Normal pulses.     Heart sounds: Normal heart sounds. No murmur heard. Pulmonary:     Breath sounds: Normal breath sounds. No wheezing or rales.  Musculoskeletal:     Cervical back: Neck supple. No tenderness.     Right lower leg: No edema.     Left lower leg: No edema.  Skin:    General: Skin is warm.     Findings: No rash.  Neurological:     General: No focal deficit present.     Mental Status: She is alert and oriented to person, place, and time.  Psychiatric:        Mood and Affect: Mood is anxious. Affect is tearful.        Behavior: Behavior is withdrawn.    Assessment & Plan:   Problem List Items Addressed This Visit       Cardiovascular and Mediastinum   Essential hypertension    BP Readings from Last 1  Encounters:  04/16/21 (!) 138/102  New onset Started Losartan 25 mg QD, check BMP after 2 weeks Counseled for compliance with the medications Advised DASH diet and moderate exercise/walking, at least 150 mins/week       Relevant Medications  losartan (COZAAR) 25 MG tablet   Other Relevant Orders   Basic Metabolic Panel (BMET)     Other   Tobacco abuse    Smokes about 1 pack/day  Asked about quitting: confirms that he/she currently smokes cigarettes Advise to quit smoking: Educated about QUITTING to reduce the risk of cancer, cardio and cerebrovascular disease. Assess willingness: Unwilling to quit at this time, but is working on cutting back. Assist with counseling and pharmacotherapy: Counseled for 5 minutes and literature provided. Arrange for follow up: follow up and continue to offer help.      Chronic pain syndrome - Primary    Was managed by Dr. Cindie Laroche  Due to underlying DDD of lumbar spine Has been evaluated by spine surgeon, patient did not want any surgical intervention Currently in severe pain Refilled oxycodone 5 mg 3 times daily as needed for now, advised patient that I do not do chronic pain management and to look for a new PCP, who would be willing to do pain management and ADHD management as well      Relevant Medications   oxyCODONE (OXY IR/ROXICODONE) 5 MG immediate release tablet   citalopram (CELEXA) 10 MG tablet   Moderate episode of recurrent major depressive disorder ([place])    Was on Celexa, refilled      Relevant Medications   citalopram (CELEXA) 10 MG tablet   Attention deficit hyperactivity disorder (ADHD)    Used to take Adderall 20 mg daily Refilled for now Advised patient to look for a new PCP who would be willing to do ADHD and chronic pain management, patient is in agreement.      Relevant Medications   amphetamine-dextroamphetamine (ADDERALL) 20 MG tablet   Spinal stenosis of lumbar region with neurogenic claudication    Last MRI  lumbar spine reviewed - Progressive spondylosis at L4-L5 with worsening now moderate spinal canal stenosis. Unchanged mild to moderate bilateral neuroforaminal stenosis.  Has been evaluated by spine spine surgery, patient did not prefer surgical intervention. On chronic opioids for chronic low back pain      Relevant Medications   oxyCODONE (OXY IR/ROXICODONE) 5 MG immediate release tablet   citalopram (CELEXA) 10 MG tablet   Other Visit Diagnoses     Screening mammogram for breast cancer       Relevant Orders   MM 3D SCREEN BREAST BILATERAL   Special screening for malignant neoplasms, colon       Relevant Orders   Cologuard   Refused influenza vaccine           Outpatient Encounter Medications as of 04/16/2021  Medication Sig   albuterol (VENTOLIN HFA) 108 (90 Base) MCG/ACT inhaler Inhale 1-2 puffs into the lungs every 6 (six) hours as needed for wheezing or shortness of breath.   citalopram (CELEXA) 10 MG tablet Take 1 tablet (10 mg total) by mouth daily.   losartan (COZAAR) 25 MG tablet Take 1 tablet (25 mg total) by mouth daily.   amphetamine-dextroamphetamine (ADDERALL) 20 MG tablet Take 1 tablet (20 mg total) by mouth daily.   fluticasone (FLONASE) 50 MCG/ACT nasal spray Place 1 spray into both nostrils daily for 14 days.   oxyCODONE (OXY IR/ROXICODONE) 5 MG immediate release tablet Take 1 tablet (5 mg total) by mouth in the morning, at noon, and at bedtime.   vitamin B-12 (CYANOCOBALAMIN) 500 MCG tablet Take 1 tablet (500 mcg total) by mouth daily. (Patient not taking: Reported on 04/16/2021)   [DISCONTINUED] amphetamine-dextroamphetamine (ADDERALL) 20 MG tablet Take  20 mg by mouth daily. (Patient not taking: Reported on 04/16/2021)   [DISCONTINUED] amphetamine-dextroamphetamine (ADDERALL) 20 MG tablet Take 1 tablet (20 mg total) by mouth daily.   [DISCONTINUED] azithromycin (ZITHROMAX) 250 MG tablet Take 1 tablet (250 mg total) by mouth daily. Take first 2 tablets together,  then 1 every day until finished. (Patient not taking: Reported on 04/16/2021)   [DISCONTINUED] oxyCODONE (OXY IR/ROXICODONE) 5 MG immediate release tablet Take 5 mg by mouth 4 (four) times daily. (Patient not taking: Reported on 04/16/2021)   No facility-administered encounter medications on file as of 04/16/2021.    Follow-up: Return in about 4 weeks (around 05/14/2021) for HTN and chronic pain.   Lindell Spar, MD

## 2021-04-19 NOTE — Assessment & Plan Note (Signed)
Last MRI lumbar spine reviewed - Progressive spondylosis at L4-L5 with worsening now moderate spinal canal stenosis. Unchanged mild to moderate bilateral neuroforaminal stenosis. ° °Has been evaluated by spine spine surgery, patient did not prefer surgical intervention. °On chronic opioids for chronic low back pain °

## 2021-05-16 ENCOUNTER — Encounter: Payer: Self-pay | Admitting: Internal Medicine

## 2021-05-16 ENCOUNTER — Other Ambulatory Visit: Payer: Self-pay

## 2021-05-16 ENCOUNTER — Ambulatory Visit: Payer: Medicaid Other | Admitting: Internal Medicine

## 2021-05-16 VITALS — BP 138/90 | HR 80 | Resp 18 | Ht 63.0 in | Wt 245.0 lb

## 2021-05-16 DIAGNOSIS — I10 Essential (primary) hypertension: Secondary | ICD-10-CM | POA: Diagnosis not present

## 2021-05-16 DIAGNOSIS — G894 Chronic pain syndrome: Secondary | ICD-10-CM | POA: Diagnosis not present

## 2021-05-16 DIAGNOSIS — Z2821 Immunization not carried out because of patient refusal: Secondary | ICD-10-CM | POA: Diagnosis not present

## 2021-05-16 DIAGNOSIS — Z720 Tobacco use: Secondary | ICD-10-CM

## 2021-05-16 DIAGNOSIS — F909 Attention-deficit hyperactivity disorder, unspecified type: Secondary | ICD-10-CM

## 2021-05-16 DIAGNOSIS — M48062 Spinal stenosis, lumbar region with neurogenic claudication: Secondary | ICD-10-CM | POA: Diagnosis not present

## 2021-05-16 DIAGNOSIS — F1190 Opioid use, unspecified, uncomplicated: Secondary | ICD-10-CM

## 2021-05-16 MED ORDER — OXYCODONE HCL 5 MG PO TABS: 5.0000 mg | ORAL_TABLET | Freq: Three times a day (TID) | ORAL | 0 refills | Status: DC

## 2021-05-16 MED ORDER — AMPHETAMINE-DEXTROAMPHETAMINE 20 MG PO TABS: 20.0000 mg | ORAL_TABLET | Freq: Every day | ORAL | 0 refills | Status: DC

## 2021-05-16 MED ORDER — LOSARTAN POTASSIUM 25 MG PO TABS: 25.0000 mg | ORAL_TABLET | Freq: Every day | ORAL | 2 refills | Status: DC

## 2021-05-16 NOTE — Patient Instructions (Signed)
Please continue taking medications as prescribed.  Please continue to follow low salt diet and ambulate as tolerated.  Please continue your efforts to cut down -> quit smoking.

## 2021-05-16 NOTE — Progress Notes (Signed)
Established Patient Office Visit  Subjective:  Patient ID: Leslie Lowery, female    DOB: 10/25/1970  Age: 51 y.o. MRN: [id number]  CC:  Chief Complaint  Patient presents with   Follow-up    4 week follow up HTN and chronic pain pain is bearable     HPI Leslie Lowery is a 51 y.o. female with past medical history of HTN, chronic pain syndrome secondary to lumbar spinal stenosis, MDD, ADHD, morbid obesity and tobacco abuse who presents for f/u of her chronic medical conditions.  HTN: Her BP is better controlled now with losartan.  She denies any headache or dizziness now.  Denies any chest pain, dyspnea or palpitations.  Lumbar spinal stenosis and chronic pain syndrome: She has started taking oxycodone again.  She has chronic, intermittent numbness and weakness of the LE along with chronic low back pain.  She also has a history of ADHD and used to take Adderall, which I have agreed to refill for now.  Of note, she is also on Celexa for MDD. She denies any SI or HI currently. She has been feeling better since starting her Adderall and Celexa again.    Past Medical History:  Diagnosis Date   Anxiety    Disc disease, degenerative, cervical    Hypertension     Past Surgical History:  Procedure Laterality Date   CESAREAN SECTION     X 2   CHOLECYSTECTOMY N/A 09/07/2018   Procedure: LAPAROSCOPIC CHOLECYSTECTOMY;  Surgeon: Virl Cagey, MD;  Location: [facility];  Service: General;  Laterality: N/A;   LIVER BIOPSY N/A 09/07/2018   Procedure: LAPAROSCOPIC  LIVER BIOPSY;  Surgeon: Virl Cagey, MD;  Location: [facility];  Service: General;  Laterality: N/A;    Family History  Problem Relation Age of Onset   Diabetes Mother    CAD Mother    CAD Father    Diabetes Father    Throat cancer Father    Colon cancer Neg Hx    Colon polyps Neg Hx        no first degree relatives with polyps   Liver disease Neg Hx     Social History   Socioeconomic History   Marital status:  Single    Spouse name: Not on file   Number of children: Not on file   Years of education: Not on file   Highest education level: Not on file  Occupational History   Not on file  Tobacco Use   Smoking status: Every Day    Packs/day: 1.00    Types: Cigarettes   Smokeless tobacco: Never  Vaping Use   Vaping Use: Never used  Substance and Sexual Activity   Alcohol use: No   Drug use: Yes    Types: Marijuana    Comment: last use was today   Sexual activity: Not on file  Other Topics Concern   Not on file  Social History Narrative   Not on file   Social Determinants of Health   Financial Resource Strain: Not on file  Food Insecurity: Not on file  Transportation Needs: Not on file  Physical Activity: Not on file  Stress: Not on file  Social Connections: Not on file  Intimate Partner Violence: Not on file    Outpatient Medications Prior to Visit  Medication Sig Dispense Refill   albuterol (VENTOLIN HFA) 108 (90 Base) MCG/ACT inhaler Inhale 1-2 puffs into the lungs every 6 (six) hours as needed for wheezing or shortness  of breath.     citalopram (CELEXA) 10 MG tablet Take 1 tablet (10 mg total) by mouth daily. 30 tablet 2   vitamin B-12 (CYANOCOBALAMIN) 500 MCG tablet Take 1 tablet (500 mcg total) by mouth daily.     amphetamine-dextroamphetamine (ADDERALL) 20 MG tablet Take 1 tablet (20 mg total) by mouth daily. 30 tablet 0   losartan (COZAAR) 25 MG tablet Take 1 tablet (25 mg total) by mouth daily. 30 tablet 0   oxyCODONE (OXY IR/ROXICODONE) 5 MG immediate release tablet Take 1 tablet (5 mg total) by mouth in the morning, at noon, and at bedtime. 90 tablet 0   fluticasone (FLONASE) 50 MCG/ACT nasal spray Place 1 spray into both nostrils daily for 14 days. 16 g 0   No facility-administered medications prior to visit.    No Known Allergies  ROS Review of Systems  Constitutional:  Positive for fatigue. Negative for chills and fever.  HENT:  Negative for congestion, sinus  pressure and sinus pain.   Eyes:  Negative for pain and discharge.  Respiratory:  Negative for cough and shortness of breath.   Cardiovascular:  Negative for chest pain and palpitations.  Gastrointestinal:  Negative for diarrhea, nausea and vomiting.  Genitourinary:  Negative for dysuria and hematuria.  Musculoskeletal:  Positive for arthralgias, back pain and neck pain. Negative for neck stiffness.  Skin:  Negative for rash.  Neurological:  Negative for dizziness and weakness.  Psychiatric/Behavioral:  Positive for agitation, decreased concentration, dysphoric mood and sleep disturbance. Negative for behavioral problems. The patient is nervous/anxious.      Objective:    Physical Exam Vitals reviewed.  Constitutional:      General: She is not in acute distress.    Appearance: She is obese. She is not diaphoretic.  HENT:     Head: Normocephalic and atraumatic.     Nose: Nose normal.     Mouth/Throat:     Mouth: Mucous membranes are moist.  Eyes:     General: No scleral icterus.    Extraocular Movements: Extraocular movements intact.  Cardiovascular:     Rate and Rhythm: Normal rate and regular rhythm.     Pulses: Normal pulses.     Heart sounds: Normal heart sounds. No murmur heard. Pulmonary:     Breath sounds: Normal breath sounds. No wheezing or rales.  Musculoskeletal:     Cervical back: Neck supple. No tenderness.     Right lower leg: No edema.     Left lower leg: No edema.  Skin:    General: Skin is warm.     Findings: No rash.  Neurological:     General: No focal deficit present.     Mental Status: She is alert and oriented to person, place, and time.  Psychiatric:        Mood and Affect: Mood is depressed.        Behavior: Behavior is withdrawn.    BP 138/90 (BP Location: Right Arm, Patient Position: Sitting, Cuff Size: Normal)    Pulse 80    Resp 18    Ht 5\' 3"  (1.6 m)    Wt 245 lb (111.1 kg)    LMP 11/01/2008 (Approximate)    SpO2 95%    BMI 43.40 kg/m  Wt  Readings from Last 3 Encounters:  05/16/21 245 lb (111.1 kg)  04/16/21 241 lb (109.3 kg)  12/15/19 230 lb (104.3 kg)    Lab Results  Component Value Date   TSH 1.654 03/21/2019  Lab Results  Component Value Date   WBC 8.5 12/15/2019   HGB 13.4 12/15/2019   HCT 40.7 12/15/2019   MCV 83.9 12/15/2019   PLT 314 12/15/2019   Lab Results  Component Value Date   NA 142 12/15/2019   K 3.4 (L) 12/15/2019   CO2 29 12/15/2019   GLUCOSE 101 (H) 12/15/2019   BUN 11 12/15/2019   CREATININE 0.75 12/15/2019   BILITOT 0.6 03/21/2019   ALKPHOS 72 03/21/2019   AST 14 (L) 03/21/2019   ALT 18 03/21/2019   PROT 6.9 03/21/2019   ALBUMIN 3.9 03/21/2019   CALCIUM 9.1 12/15/2019   ANIONGAP 10 12/15/2019   Lab Results  Component Value Date   CHOL 204 (H) 03/21/2019   Lab Results  Component Value Date   HDL 25 (L) 03/21/2019   Lab Results  Component Value Date   LDLCALC 142 (H) 03/21/2019   Lab Results  Component Value Date   TRIG 183 (H) 03/21/2019   Lab Results  Component Value Date   CHOLHDL 8.2 03/21/2019   Lab Results  Component Value Date   HGBA1C 5.6 03/21/2019      Assessment & Plan:   Problem List Items Addressed This Visit       Cardiovascular and Mediastinum   Essential hypertension    BP Readings from Last 1 Encounters:  05/16/21 138/90  New onset Well controlled with losartan 25 mg QD now, has not had BMP done yet, check BMP Counseled for compliance with the medications Advised DASH diet and moderate exercise/walking, at least 150 mins/week       Relevant Medications   losartan (COZAAR) 25 MG tablet     Other   Tobacco abuse    Smokes about 0.25 pack/day, has cut down from 1 pack/day  Asked about quitting: confirms that he/she currently smokes cigarettes Advise to quit smoking: Educated about QUITTING to reduce the risk of cancer, cardio and cerebrovascular disease. Assess willingness: Unwilling to quit at this time, but is working on cutting  back. Assist with counseling and pharmacotherapy: Counseled for 5 minutes and literature provided. Arrange for follow up: follow up and continue to offer help.      Chronic pain syndrome - Primary    Was managed by Dr. Cindie Laroche  Due to underlying DDD of lumbar spine Has been evaluated by spine surgeon, patient did not want any surgical intervention Currently in severe pain Refilled oxycodone 5 mg 3 times daily as needed for now, advised patient that I do not do chronic pain management and to look for a new PCP, who would be willing to do pain management and ADHD management as well      Relevant Medications   oxyCODONE (OXY IR/ROXICODONE) 5 MG immediate release tablet   Other Relevant Orders   ToxASSURE Select 13 (MW), Urine   Attention deficit hyperactivity disorder (ADHD)    Used to take Adderall 20 mg daily Refilled for now Advised patient to look for a new PCP who would be willing to do ADHD and chronic pain management, patient is in agreement.      Relevant Medications   amphetamine-dextroamphetamine (ADDERALL) 20 MG tablet   Other Relevant Orders   ToxASSURE Select 13 (MW), Urine   Spinal stenosis of lumbar region with neurogenic claudication    Last MRI lumbar spine reviewed - Progressive spondylosis at L4-L5 with worsening now moderate spinal canal stenosis. Unchanged mild to moderate bilateral neuroforaminal stenosis.  Has been evaluated by spine spine surgery, patient  did not prefer surgical intervention. On chronic opioids for chronic low back pain      Relevant Medications   oxyCODONE (OXY IR/ROXICODONE) 5 MG immediate release tablet   Other Visit Diagnoses     Refused influenza vaccine       Chronic, continuous use of opioids       Relevant Orders   ToxASSURE Select 13 (MW), Urine       Meds ordered this encounter  Medications   oxyCODONE (OXY IR/ROXICODONE) 5 MG immediate release tablet    Sig: Take 1 tablet (5 mg total) by mouth in the morning, at  noon, and at bedtime.    Dispense:  90 tablet    Refill:  0   amphetamine-dextroamphetamine (ADDERALL) 20 MG tablet    Sig: Take 1 tablet (20 mg total) by mouth daily.    Dispense:  30 tablet    Refill:  0   losartan (COZAAR) 25 MG tablet    Sig: Take 1 tablet (25 mg total) by mouth daily.    Dispense:  30 tablet    Refill:  2    Follow-up: Return in about 2 months (around 07/14/2021) for HTN and chronic pain.    Lindell Spar, MD

## 2021-05-16 NOTE — Assessment & Plan Note (Signed)
BP Readings from Last 1 Encounters:  05/16/21 138/90   New onset Well controlled with losartan 25 mg QD now, has not had BMP done yet, check BMP Counseled for compliance with the medications Advised DASH diet and moderate exercise/walking, at least 150 mins/week

## 2021-05-16 NOTE — Assessment & Plan Note (Signed)
Used to take Adderall 20 mg daily ?Refilled for now ?Advised patient to look for a new PCP who would be willing to do ADHD and chronic pain management, patient is in agreement. ?

## 2021-05-16 NOTE — Assessment & Plan Note (Signed)
Was managed by Dr. Cindie Laroche  Due to underlying DDD of lumbar spine Has been evaluated by spine surgeon, patient did not want any surgical intervention Currently in severe pain Refilled oxycodone 5 mg 3 times daily as needed for now, advised patient that I do not do chronic pain management and to look for a new PCP, who would be willing to do pain management and ADHD management as well

## 2021-05-16 NOTE — Assessment & Plan Note (Signed)
Smokes about 0.25 pack/day, has cut down from 1 pack/day  Asked about quitting: confirms that he/she currently smokes cigarettes Advise to quit smoking: Educated about QUITTING to reduce the risk of cancer, cardio and cerebrovascular disease. Assess willingness: Unwilling to quit at this time, but is working on cutting back. Assist with counseling and pharmacotherapy: Counseled for 5 minutes and literature provided. Arrange for follow up: follow up and continue to offer help.

## 2021-05-16 NOTE — Assessment & Plan Note (Signed)
Last MRI lumbar spine reviewed - Progressive spondylosis at L4-L5 with worsening now moderate spinal canal stenosis. Unchanged mild to moderate bilateral neuroforaminal stenosis.  Has been evaluated by spine spine surgery, patient did not prefer surgical intervention. On chronic opioids for chronic low back pain

## 2021-06-11 ENCOUNTER — Other Ambulatory Visit: Payer: Self-pay | Admitting: Internal Medicine

## 2021-06-11 DIAGNOSIS — M48062 Spinal stenosis, lumbar region with neurogenic claudication: Secondary | ICD-10-CM

## 2021-06-11 DIAGNOSIS — G894 Chronic pain syndrome: Secondary | ICD-10-CM

## 2021-06-11 DIAGNOSIS — F909 Attention-deficit hyperactivity disorder, unspecified type: Secondary | ICD-10-CM

## 2021-06-12 ENCOUNTER — Telehealth: Payer: Self-pay | Admitting: Internal Medicine

## 2021-06-12 NOTE — Telephone Encounter (Signed)
Patient needs refill on  ? ?oxyCODONE (OXY IR/ROXICODONE) 5 MG immediate release tablet  ? ?amphetamine-dextroamphetamine (ADDERALL) 20 MG tablet  ?

## 2021-06-13 ENCOUNTER — Encounter: Payer: Self-pay | Admitting: Internal Medicine

## 2021-06-13 ENCOUNTER — Other Ambulatory Visit: Payer: Self-pay | Admitting: Family Medicine

## 2021-06-13 MED ORDER — OXYCODONE HCL 5 MG PO TABS: ORAL_TABLET | ORAL | 0 refills | Status: DC

## 2021-06-13 MED ORDER — AMPHETAMINE-DEXTROAMPHETAMINE 20 MG PO TABS: 20.0000 mg | ORAL_TABLET | Freq: Every day | ORAL | 0 refills | Status: DC

## 2021-06-13 NOTE — Telephone Encounter (Signed)
[facility] called they are not taking new patients for controlled substances so both will need to be resent to [facility] if possible ?

## 2021-06-14 ENCOUNTER — Telehealth: Payer: Self-pay | Admitting: Internal Medicine

## 2021-06-14 ENCOUNTER — Other Ambulatory Visit: Payer: Self-pay | Admitting: Family Medicine

## 2021-06-14 MED ORDER — OXYCODONE HCL 5 MG PO TABS: ORAL_TABLET | ORAL | 0 refills | Status: DC

## 2021-06-14 MED ORDER — AMPHETAMINE-DEXTROAMPHETAMINE 20 MG PO TABS
ORAL_TABLET | ORAL | 0 refills | Status: DC
Start: 2021-06-14 — End: 2021-07-09

## 2021-06-14 NOTE — Telephone Encounter (Signed)
Patient called in regard to  ?oxyCODONE (ROXICODONE) 5 MG immediate release tablet  ? ?amphetamine-dextroamphetamine (ADDERALL) 20 MG tablet  ? ?Patient needs meds sent in to [facility], instead of [facility]  ? ?Patient wants all refills and new prescriptions to go to [facility]  ?

## 2021-06-28 DIAGNOSIS — I10 Essential (primary) hypertension: Secondary | ICD-10-CM | POA: Diagnosis not present

## 2021-06-28 DIAGNOSIS — E669 Obesity, unspecified: Secondary | ICD-10-CM | POA: Diagnosis not present

## 2021-06-28 DIAGNOSIS — R29703 NIHSS score 3: Secondary | ICD-10-CM | POA: Diagnosis not present

## 2021-06-28 DIAGNOSIS — G43909 Migraine, unspecified, not intractable, without status migrainosus: Secondary | ICD-10-CM | POA: Diagnosis not present

## 2021-06-28 DIAGNOSIS — I639 Cerebral infarction, unspecified: Secondary | ICD-10-CM | POA: Diagnosis not present

## 2021-06-28 DIAGNOSIS — I6523 Occlusion and stenosis of bilateral carotid arteries: Secondary | ICD-10-CM | POA: Diagnosis not present

## 2021-06-28 DIAGNOSIS — R531 Weakness: Secondary | ICD-10-CM | POA: Diagnosis not present

## 2021-06-28 DIAGNOSIS — R200 Anesthesia of skin: Secondary | ICD-10-CM | POA: Diagnosis not present

## 2021-06-28 DIAGNOSIS — Z28310 Unvaccinated for covid-19: Secondary | ICD-10-CM | POA: Diagnosis not present

## 2021-06-28 DIAGNOSIS — Z6841 Body Mass Index (BMI) 40.0 and over, adult: Secondary | ICD-10-CM | POA: Diagnosis not present

## 2021-06-28 DIAGNOSIS — R4182 Altered mental status, unspecified: Secondary | ICD-10-CM | POA: Diagnosis not present

## 2021-06-28 DIAGNOSIS — G894 Chronic pain syndrome: Secondary | ICD-10-CM | POA: Diagnosis not present

## 2021-06-28 DIAGNOSIS — R29818 Other symptoms and signs involving the nervous system: Secondary | ICD-10-CM | POA: Diagnosis not present

## 2021-06-28 DIAGNOSIS — R519 Headache, unspecified: Secondary | ICD-10-CM | POA: Diagnosis not present

## 2021-06-28 DIAGNOSIS — F419 Anxiety disorder, unspecified: Secondary | ICD-10-CM | POA: Diagnosis not present

## 2021-06-28 DIAGNOSIS — R2990 Unspecified symptoms and signs involving the nervous system: Secondary | ICD-10-CM | POA: Diagnosis not present

## 2021-06-28 DIAGNOSIS — Z20822 Contact with and (suspected) exposure to covid-19: Secondary | ICD-10-CM | POA: Diagnosis not present

## 2021-06-28 DIAGNOSIS — F909 Attention-deficit hyperactivity disorder, unspecified type: Secondary | ICD-10-CM | POA: Diagnosis not present

## 2021-06-28 DIAGNOSIS — F17210 Nicotine dependence, cigarettes, uncomplicated: Secondary | ICD-10-CM | POA: Diagnosis not present

## 2021-06-28 DIAGNOSIS — R29810 Facial weakness: Secondary | ICD-10-CM | POA: Diagnosis not present

## 2021-06-28 DIAGNOSIS — Z8673 Personal history of transient ischemic attack (TIA), and cerebral infarction without residual deficits: Secondary | ICD-10-CM | POA: Diagnosis not present

## 2021-06-28 DIAGNOSIS — Z9049 Acquired absence of other specified parts of digestive tract: Secondary | ICD-10-CM | POA: Diagnosis not present

## 2021-06-28 DIAGNOSIS — Z716 Tobacco abuse counseling: Secondary | ICD-10-CM | POA: Diagnosis not present

## 2021-06-28 DIAGNOSIS — J449 Chronic obstructive pulmonary disease, unspecified: Secondary | ICD-10-CM | POA: Diagnosis not present

## 2021-06-28 DIAGNOSIS — Z79899 Other long term (current) drug therapy: Secondary | ICD-10-CM | POA: Diagnosis not present

## 2021-06-28 DIAGNOSIS — R29898 Other symptoms and signs involving the musculoskeletal system: Secondary | ICD-10-CM | POA: Diagnosis not present

## 2021-06-28 DIAGNOSIS — Z79891 Long term (current) use of opiate analgesic: Secondary | ICD-10-CM | POA: Diagnosis not present

## 2021-06-28 DIAGNOSIS — I6782 Cerebral ischemia: Secondary | ICD-10-CM | POA: Diagnosis not present

## 2021-06-29 DIAGNOSIS — J449 Chronic obstructive pulmonary disease, unspecified: Secondary | ICD-10-CM | POA: Diagnosis not present

## 2021-06-29 DIAGNOSIS — I10 Essential (primary) hypertension: Secondary | ICD-10-CM | POA: Diagnosis not present

## 2021-06-29 DIAGNOSIS — G894 Chronic pain syndrome: Secondary | ICD-10-CM | POA: Diagnosis not present

## 2021-06-30 DIAGNOSIS — I10 Essential (primary) hypertension: Secondary | ICD-10-CM | POA: Diagnosis not present

## 2021-06-30 DIAGNOSIS — G894 Chronic pain syndrome: Secondary | ICD-10-CM | POA: Diagnosis not present

## 2021-06-30 DIAGNOSIS — J449 Chronic obstructive pulmonary disease, unspecified: Secondary | ICD-10-CM | POA: Diagnosis not present

## 2021-07-01 DIAGNOSIS — F419 Anxiety disorder, unspecified: Secondary | ICD-10-CM | POA: Diagnosis not present

## 2021-07-01 DIAGNOSIS — R29898 Other symptoms and signs involving the musculoskeletal system: Secondary | ICD-10-CM | POA: Diagnosis not present

## 2021-07-01 DIAGNOSIS — R531 Weakness: Secondary | ICD-10-CM | POA: Diagnosis not present

## 2021-07-01 DIAGNOSIS — I10 Essential (primary) hypertension: Secondary | ICD-10-CM | POA: Diagnosis not present

## 2021-07-01 DIAGNOSIS — I639 Cerebral infarction, unspecified: Secondary | ICD-10-CM | POA: Diagnosis not present

## 2021-07-01 DIAGNOSIS — F909 Attention-deficit hyperactivity disorder, unspecified type: Secondary | ICD-10-CM | POA: Diagnosis not present

## 2021-07-01 DIAGNOSIS — R29818 Other symptoms and signs involving the nervous system: Secondary | ICD-10-CM | POA: Diagnosis not present

## 2021-07-01 DIAGNOSIS — G894 Chronic pain syndrome: Secondary | ICD-10-CM | POA: Diagnosis not present

## 2021-07-01 DIAGNOSIS — F17210 Nicotine dependence, cigarettes, uncomplicated: Secondary | ICD-10-CM | POA: Diagnosis not present

## 2021-07-01 DIAGNOSIS — R202 Paresthesia of skin: Secondary | ICD-10-CM | POA: Diagnosis not present

## 2021-07-01 DIAGNOSIS — J449 Chronic obstructive pulmonary disease, unspecified: Secondary | ICD-10-CM | POA: Diagnosis not present

## 2021-07-01 DIAGNOSIS — R2990 Unspecified symptoms and signs involving the nervous system: Secondary | ICD-10-CM | POA: Diagnosis not present

## 2021-07-01 DIAGNOSIS — R200 Anesthesia of skin: Secondary | ICD-10-CM | POA: Diagnosis not present

## 2021-07-05 ENCOUNTER — Other Ambulatory Visit: Payer: Self-pay | Admitting: Internal Medicine

## 2021-07-05 DIAGNOSIS — F331 Major depressive disorder, recurrent, moderate: Secondary | ICD-10-CM

## 2021-07-09 ENCOUNTER — Other Ambulatory Visit: Payer: Self-pay | Admitting: Family Medicine

## 2021-07-09 MED ORDER — OXYCODONE HCL 5 MG PO TABS: ORAL_TABLET | ORAL | 0 refills | Status: DC

## 2021-07-09 MED ORDER — AMPHETAMINE-DEXTROAMPHETAMINE 20 MG PO TABS: ORAL_TABLET | ORAL | 0 refills | Status: DC

## 2021-07-16 ENCOUNTER — Ambulatory Visit (INDEPENDENT_AMBULATORY_CARE_PROVIDER_SITE_OTHER): Payer: Medicaid Other | Admitting: Internal Medicine

## 2021-07-16 ENCOUNTER — Encounter: Payer: Self-pay | Admitting: Internal Medicine

## 2021-07-16 VITALS — BP 132/88 | HR 105 | Resp 18 | Ht 63.0 in | Wt 216.8 lb

## 2021-07-16 DIAGNOSIS — E559 Vitamin D deficiency, unspecified: Secondary | ICD-10-CM | POA: Diagnosis not present

## 2021-07-16 DIAGNOSIS — Z09 Encounter for follow-up examination after completed treatment for conditions other than malignant neoplasm: Secondary | ICD-10-CM | POA: Diagnosis not present

## 2021-07-16 DIAGNOSIS — G459 Transient cerebral ischemic attack, unspecified: Secondary | ICD-10-CM

## 2021-07-16 DIAGNOSIS — I10 Essential (primary) hypertension: Secondary | ICD-10-CM | POA: Diagnosis not present

## 2021-07-16 DIAGNOSIS — Z0000 Encounter for general adult medical examination without abnormal findings: Secondary | ICD-10-CM

## 2021-07-16 DIAGNOSIS — F909 Attention-deficit hyperactivity disorder, unspecified type: Secondary | ICD-10-CM

## 2021-07-16 DIAGNOSIS — G894 Chronic pain syndrome: Secondary | ICD-10-CM | POA: Diagnosis not present

## 2021-07-16 DIAGNOSIS — M48062 Spinal stenosis, lumbar region with neurogenic claudication: Secondary | ICD-10-CM | POA: Diagnosis not present

## 2021-07-16 DIAGNOSIS — F331 Major depressive disorder, recurrent, moderate: Secondary | ICD-10-CM

## 2021-07-16 DIAGNOSIS — E782 Mixed hyperlipidemia: Secondary | ICD-10-CM | POA: Insufficient documentation

## 2021-07-16 DIAGNOSIS — F1190 Opioid use, unspecified, uncomplicated: Secondary | ICD-10-CM

## 2021-07-16 DIAGNOSIS — Z1211 Encounter for screening for malignant neoplasm of colon: Secondary | ICD-10-CM | POA: Diagnosis not present

## 2021-07-16 DIAGNOSIS — Z8673 Personal history of transient ischemic attack (TIA), and cerebral infarction without residual deficits: Secondary | ICD-10-CM | POA: Insufficient documentation

## 2021-07-16 MED ORDER — ROSUVASTATIN CALCIUM 10 MG PO TABS: 10.0000 mg | ORAL_TABLET | Freq: Every day | ORAL | 1 refills | Status: DC

## 2021-07-16 MED ORDER — CITALOPRAM HYDROBROMIDE 20 MG PO TABS: 20.0000 mg | ORAL_TABLET | Freq: Every day | ORAL | 2 refills | Status: DC

## 2021-07-16 NOTE — Assessment & Plan Note (Signed)
Recent hospitalization for TIA ?Had right-sided facial and UE numbness, now resolved ?Has chronic right-sided UE and LE weakness, referred to PT ?Continue aspirin ?Added statin ?

## 2021-07-16 NOTE — Assessment & Plan Note (Addendum)
BP Readings from Last 1 Encounters:  ?07/16/21 132/88  ? ?Well controlled with losartan 25 mg QD now ?Counseled for compliance with the medications ?Advised DASH diet and moderate exercise/walking, at least 150 mins/week ?

## 2021-07-16 NOTE — Assessment & Plan Note (Signed)
Uncontrolled currently ?Increased dose of Celexa to 20 mg daily now ?

## 2021-07-16 NOTE — Assessment & Plan Note (Signed)
Hospital chart reviewed, including imaging ?Medications reconciled and reviewed with the patient ?On aspirin currently, added statin for HLD and TIA ?Referred to PT ?

## 2021-07-16 NOTE — Assessment & Plan Note (Signed)
Last MRI lumbar spine reviewed - Progressive spondylosis at L4-L5 with worsening now moderate spinal canal stenosis. Unchanged mild to moderate bilateral neuroforaminal stenosis.  Has been evaluated by spine surgery, patient did not prefer surgical intervention. On chronic opioids for chronic low back pain 

## 2021-07-16 NOTE — Progress Notes (Signed)
? ?Established Patient Office Visit ? ?Subjective:  ?Patient ID: Leslie Lowery, female    DOB: 01-03-1971  Age: 51 y.o. MRN: [id number] ? ?CC:  ?Chief Complaint  ?Patient presents with  ? Follow-up  ?  Follow up HTN and chronic pain depression not any better   ? ? ?HPI ?Leslie Lowery is a 51 y.o. female with past medical history of HTN, chronic pain syndrome secondary to lumbar spinal stenosis, MDD, ADHD, morbid obesity and tobacco abuse who presents for f/u of her chronic medical conditions. ? ?She was recently admitted in [facility] for TIA from 03/31-04/03.  She was having right-sided facial numbness and right UE numbness and weakness along with dizziness, which had resolved when she went to hospital.  Imaging including CTA head and MRI brain was unremarkable.  She currently takes aspirin, but was not started on statin.  She has chronic right UE and LE weakness.  Her LE weakness is likely due to lumbar spinal stenosis. ? ?HTN: Her BP is better controlled now with losartan.  She denies any headache or dizziness now.  Denies any chest pain, dyspnea or palpitations. ? ?Lumbar spinal stenosis and chronic pain syndrome: She has started taking oxycodone again.  She has chronic, intermittent numbness and weakness of the LE along with chronic low back pain. ? ?Depression and ADHD: She is on Celexa 10 mg QD currently for MDD.  She states that she has been feeling depressed at home.  Her daughter is struggling with postpartum depression and she is anxious about it.  She has crying spells at home.  She denies any SI or HI currently.  She has been taking Adderall for ADHD as well. ? ?Past Medical History:  ?Diagnosis Date  ? Anxiety   ? Disc disease, degenerative, cervical   ? Hypertension   ? ? ?Past Surgical History:  ?Procedure Laterality Date  ? CESAREAN SECTION    ? X 2  ? CHOLECYSTECTOMY N/A 09/07/2018  ? Procedure: LAPAROSCOPIC CHOLECYSTECTOMY;  Surgeon: Virl Cagey, MD;  Location: [facility];   Service: General;  Laterality: N/A;  ? LIVER BIOPSY N/A 09/07/2018  ? Procedure: LAPAROSCOPIC  LIVER BIOPSY;  Surgeon: Virl Cagey, MD;  Location: [facility];  Service: General;  Laterality: N/A;  ? ? ?Family History  ?Problem Relation Age of Onset  ? Diabetes Mother   ? CAD Mother   ? CAD Father   ? Diabetes Father   ? Throat cancer Father   ? Colon cancer Neg Hx   ? Colon polyps Neg Hx   ?     no first degree relatives with polyps  ? Liver disease Neg Hx   ? ? ?Social History  ? ?Socioeconomic History  ? Marital status: Single  ?  Spouse name: Not on file  ? Number of children: Not on file  ? Years of education: Not on file  ? Highest education level: Not on file  ?Occupational History  ? Not on file  ?Tobacco Use  ? Smoking status: Every Day  ?  Packs/day: 1.00  ?  Types: Cigarettes  ? Smokeless tobacco: Never  ?Vaping Use  ? Vaping Use: Never used  ?Substance and Sexual Activity  ? Alcohol use: No  ? Drug use: Yes  ?  Types: Marijuana  ?  Comment: last use was today  ? Sexual activity: Not on file  ?Other Topics Concern  ? Not on file  ?Social History Narrative  ? Not on file  ? ?  Social Determinants of Health  ? ?Financial Resource Strain: Not on file  ?Food Insecurity: Not on file  ?Transportation Needs: Not on file  ?Physical Activity: Not on file  ?Stress: Not on file  ?Social Connections: Not on file  ?Intimate Partner Violence: Not on file  ? ? ?Outpatient Medications Prior to Visit  ?Medication Sig Dispense Refill  ? albuterol (VENTOLIN HFA) 108 (90 Base) MCG/ACT inhaler Inhale 1-2 puffs into the lungs every 6 (six) hours as needed for wheezing or shortness of breath.    ? amphetamine-dextroamphetamine (ADDERALL) 20 MG tablet Take one tablet by mouth once daily 30 tablet 0  ? aspirin EC 81 MG tablet Take 81 mg by mouth daily. Swallow whole.    ? losartan (COZAAR) 25 MG tablet Take 1 tablet (25 mg total) by mouth daily. 30 tablet 2  ? oxyCODONE (ROXICODONE) 5 MG immediate release tablet Take one tablet by  mouth three tiimes daily for pain 90 tablet 0  ? vitamin B-12 (CYANOCOBALAMIN) 500 MCG tablet Take 1 tablet (500 mcg total) by mouth daily.    ? citalopram (CELEXA) 10 MG tablet TAKE 1 TABLET BY MOUTH DAILY 30 tablet 2  ? fluticasone (FLONASE) 50 MCG/ACT nasal spray Place 1 spray into both nostrils daily for 14 days. 16 g 0  ? ?No facility-administered medications prior to visit.  ? ? ?No Known Allergies ? ?ROS ?Review of Systems  ?Constitutional:  Positive for fatigue. Negative for chills and fever.  ?HENT:  Negative for congestion, sinus pressure and sinus pain.   ?Eyes:  Negative for pain and discharge.  ?Respiratory:  Negative for cough and shortness of breath.   ?Cardiovascular:  Negative for chest pain and palpitations.  ?Gastrointestinal:  Negative for diarrhea, nausea and vomiting.  ?Genitourinary:  Negative for dysuria and hematuria.  ?Musculoskeletal:  Positive for arthralgias, back pain and neck pain. Negative for neck stiffness.  ?Skin:  Negative for rash.  ?Neurological:  Negative for dizziness and weakness.  ?Psychiatric/Behavioral:  Positive for agitation, decreased concentration, dysphoric mood and sleep disturbance. Negative for behavioral problems. The patient is nervous/anxious.   ? ?  ?Objective:  ?  ?Physical Exam ?Vitals reviewed.  ?Constitutional:   ?   General: She is not in acute distress. ?   Appearance: She is obese. She is not diaphoretic.  ?HENT:  ?   Head: Normocephalic and atraumatic.  ?   Nose: Nose normal.  ?   Mouth/Throat:  ?   Mouth: Mucous membranes are moist.  ?Eyes:  ?   General: No scleral icterus. ?   Extraocular Movements: Extraocular movements intact.  ?Cardiovascular:  ?   Rate and Rhythm: Normal rate and regular rhythm.  ?   Pulses: Normal pulses.  ?   Heart sounds: Normal heart sounds. No murmur heard. ?Pulmonary:  ?   Breath sounds: Normal breath sounds. No wheezing or rales.  ?Abdominal:  ?   Palpations: Abdomen is soft.  ?   Tenderness: There is no abdominal  tenderness.  ?Musculoskeletal:  ?   Cervical back: Neck supple. No tenderness.  ?   Right lower leg: No edema.  ?   Left lower leg: No edema.  ?Skin: ?   General: Skin is warm.  ?   Findings: No rash.  ?Neurological:  ?   General: No focal deficit present.  ?   Mental Status: She is alert and oriented to person, place, and time.  ?   Cranial Nerves: No cranial nerve deficit.  ?  Sensory: No sensory deficit.  ?   Motor: Weakness (B/l LE - 4/5) present.  ?Psychiatric:     ?   Mood and Affect: Mood is depressed. Affect is tearful.     ?   Behavior: Behavior is withdrawn.  ? ? ?BP 132/88 (BP Location: Right Arm, Patient Position: Sitting, Cuff Size: Normal)   Pulse (!) 105   Resp 18   Ht 5\' 3"  (1.6 m)   Wt 216 lb 12.8 oz (98.3 kg)   LMP 11/01/2008 (Approximate)   SpO2 96%   BMI 38.40 kg/m?  ?Wt Readings from Last 3 Encounters:  ?07/16/21 216 lb 12.8 oz (98.3 kg)  ?05/16/21 245 lb (111.1 kg)  ?04/16/21 241 lb (109.3 kg)  ? ? ?Lab Results  ?Component Value Date  ? TSH 1.654 03/21/2019  ? ?Lab Results  ?Component Value Date  ? WBC 8.5 12/15/2019  ? HGB 13.4 12/15/2019  ? HCT 40.7 12/15/2019  ? MCV 83.9 12/15/2019  ? PLT 314 12/15/2019  ? ?Lab Results  ?Component Value Date  ? NA 142 12/15/2019  ? K 3.4 (L) 12/15/2019  ? CO2 29 12/15/2019  ? GLUCOSE 101 (H) 12/15/2019  ? BUN 11 12/15/2019  ? CREATININE 0.75 12/15/2019  ? BILITOT 0.6 03/21/2019  ? ALKPHOS 72 03/21/2019  ? AST 14 (L) 03/21/2019  ? ALT 18 03/21/2019  ? PROT 6.9 03/21/2019  ? ALBUMIN 3.9 03/21/2019  ? CALCIUM 9.1 12/15/2019  ? ANIONGAP 10 12/15/2019  ? ?Lab Results  ?Component Value Date  ? CHOL 204 (H) 03/21/2019  ? ?Lab Results  ?Component Value Date  ? HDL 25 (L) 03/21/2019  ? ?Lab Results  ?Component Value Date  ? LDLCALC 142 (H) 03/21/2019  ? ?Lab Results  ?Component Value Date  ? TRIG 183 (H) 03/21/2019  ? ?Lab Results  ?Component Value Date  ? CHOLHDL 8.2 03/21/2019  ? ?Lab Results  ?Component Value Date  ? HGBA1C 5.6 03/21/2019  ? ? ?   ?Assessment & Plan:  ? ?Problem List Items Addressed This Visit   ? ?  ? Cardiovascular and Mediastinum  ? Essential hypertension  ?  BP Readings from Last 1 Encounters:  ?07/16/21 132/88  ?Well controlled with losartan 25 mg Q

## 2021-07-16 NOTE — Assessment & Plan Note (Signed)
Used to take Adderall 20 mg daily ?Refilled for now ?Advised patient to look for a new PCP who would be willing to do ADHD and chronic pain management, patient is in agreement. ?

## 2021-07-16 NOTE — Patient Instructions (Addendum)
Please start taking Citalopram 20 mg once daily instead of 10 mg. ? ?Please take Crestor for high cholesterol. ? ?Please continue to take other medications as prescribed. ? ?Please get fasting blood tests done before the next visit. ?

## 2021-07-16 NOTE — Assessment & Plan Note (Signed)
Was managed by Dr. Dondiego ° °Due to underlying DDD of lumbar spine °Has been evaluated by spine surgeon, patient did not want any surgical intervention °Currently in severe pain °Refilled oxycodone 5 mg 3 times daily as needed for now, advised patient that I do not do chronic pain management and to look for a new PCP, who would be willing to do pain management and ADHD management as well °

## 2021-07-16 NOTE — Assessment & Plan Note (Signed)
Lipid profile reviewed from care everywhere Started Crestor 

## 2021-07-17 ENCOUNTER — Encounter: Payer: Self-pay | Admitting: Internal Medicine

## 2021-07-17 LAB — CBC WITH DIFFERENTIAL/PLATELET
Basophils Absolute: 0.1 10*3/uL (ref 0.0–0.2)
Basos: 1 %
EOS (ABSOLUTE): 0.1 10*3/uL (ref 0.0–0.4)
Eos: 1 %
Hematocrit: 38.3 % (ref 34.0–46.6)
Hemoglobin: 12.8 g/dL (ref 11.1–15.9)
Immature Grans (Abs): 0 10*3/uL (ref 0.0–0.1)
Immature Granulocytes: 0 %
Lymphocytes Absolute: 2.8 10*3/uL (ref 0.7–3.1)
Lymphs: 30 %
MCH: 27.7 pg (ref 26.6–33.0)
MCHC: 33.4 g/dL (ref 31.5–35.7)
MCV: 83 fL (ref 79–97)
Monocytes Absolute: 0.6 10*3/uL (ref 0.1–0.9)
Monocytes: 6 %
Neutrophils Absolute: 5.9 10*3/uL (ref 1.4–7.0)
Neutrophils: 62 %
Platelets: 330 10*3/uL (ref 150–450)
RBC: 4.62 x10E6/uL (ref 3.77–5.28)
RDW: 13.2 % (ref 11.7–15.4)
WBC: 9.4 10*3/uL (ref 3.4–10.8)

## 2021-07-17 LAB — HEMOGLOBIN A1C
Est. average glucose Bld gHb Est-mCnc: 111 mg/dL
Hgb A1c MFr Bld: 5.5 % (ref 4.8–5.6)

## 2021-07-17 LAB — VITAMIN D 25 HYDROXY (VIT D DEFICIENCY, FRACTURES): Vit D, 25-Hydroxy: 17.8 ng/mL — ABNORMAL LOW (ref 30.0–100.0)

## 2021-07-17 LAB — LIPID PANEL
Chol/HDL Ratio: 5.8 ratio — ABNORMAL HIGH (ref 0.0–4.4)
Cholesterol, Total: 204 mg/dL — ABNORMAL HIGH (ref 100–199)
HDL: 35 mg/dL — ABNORMAL LOW (ref >39)
LDL Chol Calc (NIH): 148 mg/dL — ABNORMAL HIGH (ref 0–99)
Triglycerides: 116 mg/dL (ref 0–149)
VLDL Cholesterol Cal: 21 mg/dL (ref 5–40)

## 2021-07-17 LAB — CMP14+EGFR
ALT: 14 IU/L (ref 0–32)
AST: 14 IU/L (ref 0–40)
Albumin/Globulin Ratio: 1.7 (ref 1.2–2.2)
Albumin: 4.6 g/dL (ref 3.8–4.9)
Alkaline Phosphatase: 100 IU/L (ref 44–121)
BUN/Creatinine Ratio: 12 (ref 9–23)
BUN: 10 mg/dL (ref 6–24)
Bilirubin Total: 0.4 mg/dL (ref 0.0–1.2)
CO2: 25 mmol/L (ref 20–29)
Calcium: 9.4 mg/dL (ref 8.7–10.2)
Chloride: 96 mmol/L (ref 96–106)
Creatinine, Ser: 0.85 mg/dL (ref 0.57–1.00)
Globulin, Total: 2.7 g/dL (ref 1.5–4.5)
Glucose: 97 mg/dL (ref 70–99)
Potassium: 3.4 mmol/L — ABNORMAL LOW (ref 3.5–5.2)
Sodium: 137 mmol/L (ref 134–144)
Total Protein: 7.3 g/dL (ref 6.0–8.5)
eGFR: 83 mL/min/{1.73_m2} (ref >59)

## 2021-07-17 LAB — TSH: TSH: 2.56 u[IU]/mL (ref 0.450–4.500)

## 2021-07-20 LAB — TOXASSURE SELECT 13 (MW), URINE

## 2021-08-02 ENCOUNTER — Other Ambulatory Visit: Payer: Self-pay | Admitting: Internal Medicine

## 2021-08-05 ENCOUNTER — Other Ambulatory Visit: Payer: Self-pay | Admitting: Internal Medicine

## 2021-08-05 MED ORDER — OXYCODONE HCL 5 MG PO TABS: ORAL_TABLET | ORAL | 0 refills | Status: DC

## 2021-08-05 MED ORDER — AMPHETAMINE-DEXTROAMPHETAMINE 20 MG PO TABS: ORAL_TABLET | ORAL | 0 refills | Status: DC

## 2021-08-06 ENCOUNTER — Ambulatory Visit: Payer: Medicaid Other | Admitting: Gastroenterology

## 2021-08-06 ENCOUNTER — Ambulatory Visit (HOSPITAL_COMMUNITY): Payer: Medicaid Other | Attending: Internal Medicine

## 2021-08-06 NOTE — Progress Notes (Deleted)
OUTPATIENT PHYSICAL THERAPY THORACOLUMBAR EVALUATION   Patient Name: Leslie Lowery MRN: [id number] DOB:1970-06-05, 51 y.o., female Today's Date: 08/06/2021    Past Medical History:  Diagnosis Date   Anxiety    Disc disease, degenerative, cervical    Hypertension    Past Surgical History:  Procedure Laterality Date   CESAREAN SECTION     X 2   CHOLECYSTECTOMY N/A 09/07/2018   Procedure: LAPAROSCOPIC CHOLECYSTECTOMY;  Surgeon: Virl Cagey, MD;  Location: [facility];  Service: General;  Laterality: N/A;   LIVER BIOPSY N/A 09/07/2018   Procedure: LAPAROSCOPIC  LIVER BIOPSY;  Surgeon: Virl Cagey, MD;  Location: [facility];  Service: General;  Laterality: N/A;   Patient Active Problem List   Diagnosis Date Noted   Hospital discharge follow-up 07/16/2021   TIA (transient ischemic attack) 07/16/2021   Mixed hyperlipidemia 07/16/2021   Anxiety 06/28/2021   COPD (chronic obstructive pulmonary disease) ([place]) 06/28/2021   Moderate episode of recurrent major depressive disorder ([place]) 04/19/2021   Attention deficit hyperactivity disorder (ADHD) 04/19/2021   Spinal stenosis of lumbar region with neurogenic claudication 04/19/2021   Essential hypertension 04/16/2021   Right hemiparesis ([place]) 03/21/2019   Tobacco abuse 03/21/2019   Chronic pain syndrome 03/21/2019   Dyspepsia 10/27/2018   Chronic cholecystitis    Fatty liver    Hepatomegaly 09/04/2018    PCP: Lindell Spar, MD   REFERRING PROVIDER: Lindell Spar, MD   REFERRING DIAG: [phone] (ICD-10-CM) - Spinal stenosis of lumbar region with neurogenic claudication G45.9 (ICD-10-CM) - TIA (transient ischemic attack)   THERAPY DIAG:  No diagnosis found.  ONSET DATE: ***  SUBJECTIVE:                                                                                                                                                                                           SUBJECTIVE STATEMENT: *** PERTINENT HISTORY:   06/28/21 - per chart review - ED with stroke symptoms intermittent with altered mental state, left face dropping, and right side weakness all with full resolve   PAIN:  Are you having pain? Yes: {yespain:27235::"NPRS scale: ***/10","Pain location: ***","Pain description: ***","Aggravating factors: ***","Relieving factors: ***"}   PRECAUTIONS: {Therapy precautions:24002}  WEIGHT BEARING RESTRICTIONS {Yes ***/No:24003}  FALLS:  Has patient fallen in last 6 months? {fallsyesno:27318}  LIVING ENVIRONMENT: Lives with: {[facility] lives with:25569::"lives with their family"} Lives in: {Lives in:25570} Stairs: {opstairs:27293} Has following equipment at home: {Assistive devices:23999}  OCCUPATION: ***  PLOF: {PLOF:24004}  PATIENT GOALS ***   OBJECTIVE:   DIAGNOSTIC FINDINGS:  Chart review of recent CT with and without contrast on 06/28/21 after ED with  stroke like symptoms all with no findings  PATIENT SURVEYS:  {rehab surveys:24030}  SCREENING FOR RED FLAGS: Bowel or bladder incontinence: {Yes/No:304960894} Spinal tumors: {Yes/No:304960894} Cauda equina syndrome: {Yes/No:304960894} Compression fracture: {Yes/No:304960894} Abdominal aneurysm: {Yes/No:304960894}  COGNITION:  Overall cognitive status: {cognition:24006}     SENSATION: {sensation:27233}  MUSCLE LENGTH: Hamstrings: Right *** deg; Left *** deg Thomas test: Right *** deg; Left *** deg  POSTURE:  ***  PALPATION: ***  LUMBAR ROM:   {AROM/PROM:27142}  A/PROM  08/06/2021  Flexion   Extension   Right lateral flexion   Left lateral flexion   Right rotation   Left rotation    (Blank rows = not tested)  LE ROM:  {AROM/PROM:27142}  Right 08/06/2021 Left 08/06/2021  Hip flexion    Hip extension    Hip abduction    Hip adduction    Hip internal rotation    Hip external rotation    Knee flexion    Knee extension    Ankle dorsiflexion    Ankle plantarflexion    Ankle inversion    Ankle eversion      (Blank rows = not tested)  LE MMT:  MMT Right 08/06/2021 Left 08/06/2021  Hip flexion    Hip extension    Hip abduction    Hip adduction    Hip internal rotation    Hip external rotation    Knee flexion    Knee extension    Ankle dorsiflexion    Ankle plantarflexion    Ankle inversion    Ankle eversion     (Blank rows = not tested)  LUMBAR SPECIAL TESTS:  {lumbar special test:25242}  FUNCTIONAL TESTS:  {Functional tests:24029}  GAIT: Distance walked: *** Assistive device utilized: {Assistive devices:23999} Level of assistance: {Levels of assistance:24026} Comments: ***    TODAY'S TREATMENT  ***   PATIENT EDUCATION:  Education details: *** Person educated: {Person educated:25204} Education method: {Education Method:25205} Education comprehension: {Education Comprehension:25206}   HOME EXERCISE PROGRAM: ***  ASSESSMENT:  CLINICAL IMPRESSION: Patient is a *** y.o. *** who was seen today for physical therapy evaluation and treatment for ***.    OBJECTIVE IMPAIRMENTS {opptimpairments:25111}.   ACTIVITY LIMITATIONS {activity limitations:25113}.   PERSONAL FACTORS {Personal factors:25162} are also affecting patient's functional outcome.    REHAB POTENTIAL: {rehabpotential:25112}  CLINICAL DECISION MAKING: {clinical decision making:25114}  EVALUATION COMPLEXITY: {Evaluation complexity:25115}   GOALS: Goals reviewed with patient? {yes/no:20286}  SHORT TERM GOALS: Target date: {follow up:25551}  *** Baseline: Goal status: {GOALSTATUS:25110}  2.  *** Baseline:  Goal status: {GOALSTATUS:25110}  3.  *** Baseline:  Goal status: {GOALSTATUS:25110}  4.  *** Baseline:  Goal status: {GOALSTATUS:25110}  5.  *** Baseline:  Goal status: {GOALSTATUS:25110}  6.  *** Baseline:  Goal status: {GOALSTATUS:25110}  LONG TERM GOALS: Target date: {follow up:25551}  *** Baseline:  Goal status: {GOALSTATUS:25110}  2.  *** Baseline:  Goal status:  {GOALSTATUS:25110}  3.  *** Baseline:  Goal status: {GOALSTATUS:25110}  4.  *** Baseline:  Goal status: {GOALSTATUS:25110}  5.  *** Baseline:  Goal status: {GOALSTATUS:25110}  6.  *** Baseline:  Goal status: {GOALSTATUS:25110}   PLAN: PT FREQUENCY: {rehab frequency:25116}  PT DURATION: {rehab duration:25117}  PLANNED INTERVENTIONS: {rehab planned interventions:25118::"Therapeutic exercises","Therapeutic activity","Neuromuscular re-education","Balance training","Gait training","Patient/Family education","Joint mobilization"}.  PLAN FOR NEXT SESSION: ***   Jamse Belfast, PT 08/06/2021, 10:30 AM

## 2021-08-15 ENCOUNTER — Encounter: Payer: Self-pay | Admitting: Internal Medicine

## 2021-08-15 ENCOUNTER — Ambulatory Visit (INDEPENDENT_AMBULATORY_CARE_PROVIDER_SITE_OTHER): Payer: Medicaid Other | Admitting: Internal Medicine

## 2021-08-15 DIAGNOSIS — Z1211 Encounter for screening for malignant neoplasm of colon: Secondary | ICD-10-CM

## 2021-08-26 ENCOUNTER — Other Ambulatory Visit: Payer: Self-pay | Admitting: Internal Medicine

## 2021-08-26 DIAGNOSIS — I10 Essential (primary) hypertension: Secondary | ICD-10-CM

## 2021-09-02 ENCOUNTER — Other Ambulatory Visit: Payer: Self-pay | Admitting: Internal Medicine

## 2021-09-03 ENCOUNTER — Other Ambulatory Visit: Payer: Self-pay | Admitting: Internal Medicine

## 2021-09-04 MED ORDER — AMPHETAMINE-DEXTROAMPHETAMINE 20 MG PO TABS: ORAL_TABLET | ORAL | 0 refills | Status: DC

## 2021-09-04 MED ORDER — OXYCODONE HCL 5 MG PO TABS: ORAL_TABLET | ORAL | 0 refills | Status: DC

## 2021-09-27 ENCOUNTER — Other Ambulatory Visit: Payer: Self-pay | Admitting: Internal Medicine

## 2021-09-27 MED ORDER — AMPHETAMINE-DEXTROAMPHETAMINE 20 MG PO TABS: ORAL_TABLET | ORAL | 0 refills | Status: DC

## 2021-09-27 MED ORDER — OXYCODONE HCL 5 MG PO TABS: ORAL_TABLET | ORAL | 0 refills | Status: DC

## 2021-09-30 ENCOUNTER — Telehealth: Payer: Self-pay | Admitting: Internal Medicine

## 2021-09-30 NOTE — Telephone Encounter (Signed)
Pt called needing a refill on oxyCODONE (ROXICODONE) 5 MG immediate release tablet  and amphetamine-dextroamphetamine (ADDERALL) 20 MG tablet. She is going out of town and will be out before she comes back on Sunday. Wants to know if you can please refill today?    [facility]

## 2021-10-01 DIAGNOSIS — N200 Calculus of kidney: Secondary | ICD-10-CM | POA: Diagnosis not present

## 2021-10-01 DIAGNOSIS — Z9049 Acquired absence of other specified parts of digestive tract: Secondary | ICD-10-CM | POA: Diagnosis not present

## 2021-10-01 DIAGNOSIS — K5730 Diverticulosis of large intestine without perforation or abscess without bleeding: Secondary | ICD-10-CM | POA: Diagnosis not present

## 2021-10-01 DIAGNOSIS — I700 Atherosclerosis of aorta: Secondary | ICD-10-CM | POA: Diagnosis not present

## 2021-10-01 DIAGNOSIS — N3000 Acute cystitis without hematuria: Secondary | ICD-10-CM | POA: Diagnosis not present

## 2021-10-01 DIAGNOSIS — I10 Essential (primary) hypertension: Secondary | ICD-10-CM | POA: Diagnosis not present

## 2021-10-01 DIAGNOSIS — N3289 Other specified disorders of bladder: Secondary | ICD-10-CM | POA: Diagnosis not present

## 2021-10-01 DIAGNOSIS — J449 Chronic obstructive pulmonary disease, unspecified: Secondary | ICD-10-CM | POA: Diagnosis not present

## 2021-10-01 DIAGNOSIS — F17210 Nicotine dependence, cigarettes, uncomplicated: Secondary | ICD-10-CM | POA: Diagnosis not present

## 2021-10-04 DIAGNOSIS — R0602 Shortness of breath: Secondary | ICD-10-CM | POA: Diagnosis not present

## 2021-10-04 DIAGNOSIS — R519 Headache, unspecified: Secondary | ICD-10-CM | POA: Diagnosis not present

## 2021-10-04 DIAGNOSIS — F419 Anxiety disorder, unspecified: Secondary | ICD-10-CM | POA: Diagnosis not present

## 2021-10-04 DIAGNOSIS — R410 Disorientation, unspecified: Secondary | ICD-10-CM | POA: Diagnosis not present

## 2021-10-04 DIAGNOSIS — F17210 Nicotine dependence, cigarettes, uncomplicated: Secondary | ICD-10-CM | POA: Diagnosis not present

## 2021-10-04 DIAGNOSIS — I10 Essential (primary) hypertension: Secondary | ICD-10-CM | POA: Diagnosis not present

## 2021-10-04 DIAGNOSIS — G894 Chronic pain syndrome: Secondary | ICD-10-CM | POA: Diagnosis not present

## 2021-10-04 DIAGNOSIS — M79604 Pain in right leg: Secondary | ICD-10-CM | POA: Diagnosis not present

## 2021-10-04 DIAGNOSIS — J449 Chronic obstructive pulmonary disease, unspecified: Secondary | ICD-10-CM | POA: Diagnosis not present

## 2021-10-04 DIAGNOSIS — I4510 Unspecified right bundle-branch block: Secondary | ICD-10-CM | POA: Diagnosis not present

## 2021-10-04 DIAGNOSIS — R1031 Right lower quadrant pain: Secondary | ICD-10-CM | POA: Diagnosis not present

## 2021-10-04 DIAGNOSIS — M79605 Pain in left leg: Secondary | ICD-10-CM | POA: Diagnosis not present

## 2021-10-15 ENCOUNTER — Ambulatory Visit: Payer: Medicaid Other | Admitting: Internal Medicine

## 2021-10-28 ENCOUNTER — Other Ambulatory Visit: Payer: Self-pay | Admitting: Internal Medicine

## 2021-10-29 ENCOUNTER — Telehealth: Payer: Self-pay

## 2021-10-29 ENCOUNTER — Encounter: Payer: Self-pay | Admitting: Internal Medicine

## 2021-10-29 ENCOUNTER — Ambulatory Visit: Payer: Medicaid Other | Admitting: Internal Medicine

## 2021-10-29 VITALS — BP 104/62 | HR 66 | Resp 18 | Ht 63.0 in | Wt 216.2 lb

## 2021-10-29 DIAGNOSIS — G894 Chronic pain syndrome: Secondary | ICD-10-CM

## 2021-10-29 DIAGNOSIS — F331 Major depressive disorder, recurrent, moderate: Secondary | ICD-10-CM | POA: Diagnosis not present

## 2021-10-29 DIAGNOSIS — F909 Attention-deficit hyperactivity disorder, unspecified type: Secondary | ICD-10-CM

## 2021-10-29 DIAGNOSIS — G8929 Other chronic pain: Secondary | ICD-10-CM | POA: Diagnosis not present

## 2021-10-29 DIAGNOSIS — M48062 Spinal stenosis, lumbar region with neurogenic claudication: Secondary | ICD-10-CM | POA: Diagnosis not present

## 2021-10-29 DIAGNOSIS — J439 Emphysema, unspecified: Secondary | ICD-10-CM | POA: Diagnosis not present

## 2021-10-29 DIAGNOSIS — I10 Essential (primary) hypertension: Secondary | ICD-10-CM

## 2021-10-29 DIAGNOSIS — R109 Unspecified abdominal pain: Secondary | ICD-10-CM | POA: Diagnosis not present

## 2021-10-29 DIAGNOSIS — F419 Anxiety disorder, unspecified: Secondary | ICD-10-CM | POA: Diagnosis not present

## 2021-10-29 DIAGNOSIS — Z23 Encounter for immunization: Secondary | ICD-10-CM

## 2021-10-29 MED ORDER — STIOLTO RESPIMAT 2.5-2.5 MCG/ACT IN AERS: 2.0000 | INHALATION_SPRAY | Freq: Every day | RESPIRATORY_TRACT | 11 refills | Status: DC

## 2021-10-29 MED ORDER — OXYCODONE HCL 5 MG PO TABS: ORAL_TABLET | ORAL | 0 refills | Status: DC

## 2021-10-29 MED ORDER — LOSARTAN POTASSIUM 25 MG PO TABS: 25.0000 mg | ORAL_TABLET | Freq: Every day | ORAL | 1 refills | Status: DC

## 2021-10-29 MED ORDER — CITALOPRAM HYDROBROMIDE 20 MG PO TABS: 20.0000 mg | ORAL_TABLET | Freq: Every day | ORAL | 1 refills | Status: DC

## 2021-10-29 NOTE — Assessment & Plan Note (Signed)
Uncontrolled with Albuterol inhaler PRN Added Stiolto as maintenance inhaler

## 2021-10-29 NOTE — Assessment & Plan Note (Signed)
BP Readings from Last 1 Encounters:  10/29/21 104/62   Well controlled with losartan 25 mg QD now Counseled for compliance with the medications Advised DASH diet and moderate exercise/walking, at least 150 mins/week

## 2021-10-29 NOTE — Assessment & Plan Note (Signed)
On Adderall 20 mg daily Refilled for now 

## 2021-10-29 NOTE — Assessment & Plan Note (Signed)
Unclear etiology Has chronic diarrhea as well Recent CT abdomen showed nonobstructive renal stone, less likely to be a reason for chronic abdominal pain Referred to GI

## 2021-10-29 NOTE — Assessment & Plan Note (Addendum)
Was managed by Dr. Janna Arch  Due to underlying DDD of lumbar spine Has been evaluated by spine surgeon, patient did not want any surgical intervention Currently in severe pain Refilled oxycodone 5 mg 3 times daily as needed for now -  Warned to avoid early refills, will allow just today  Warned to avoid THC products

## 2021-10-29 NOTE — Assessment & Plan Note (Signed)
Uncontrolled currently On Celexa to 20 mg daily now Avoid adding multiple medication for now

## 2021-10-29 NOTE — Telephone Encounter (Signed)
Patient called said the nurse never called her pharmacy to fill in the Wednesday morning.   oxyCODONE (OXY IR/ROXICODONE) 5 MG immediate release tablet    amphetamine-dextroamphetamine (ADDERALL) 20 MG tablet [[id number]   Pharmacy:[facility]

## 2021-10-29 NOTE — Patient Instructions (Addendum)
Please continue to take medications as prescribed.  Please use Stiolto inhaler regularly and albuterol inhaler as needed for shortness of breath or wheezing.  Please continue to follow low salt diet and ambulate as tolerated.

## 2021-10-29 NOTE — Assessment & Plan Note (Signed)
On Celexa Would avoid BZD as she is on chronic opiates

## 2021-10-29 NOTE — Progress Notes (Signed)
Established Patient Office Visit  Subjective:  Patient ID: Leslie Lowery, female    DOB: 10/27/1970  Age: 51 y.o. MRN: [id number]  CC:  Chief Complaint  Patient presents with   Follow-up    3 month follow up Chronic pain ADHD patient COPD is bad she also has swelling in legs and ankles and has had a chrons flare up     HPI Leslie Lowery is a 51 y.o. female with past medical history of HTN, chronic pain syndrome secondary to lumbar spinal stenosis, MDD, ADHD, morbid obesity and tobacco abuse who presents for f/u of her chronic medical conditions.  HTN: Her BP is better controlled now with losartan.  She denies any headache or dizziness now.  Denies any chest pain, dyspnea or palpitations.  COPD: She reports recent worsening of dyspnea and wheezing.  She is still smokes, but is trying to cut down.  She has been using albuterol inhaler about 4 times in a day.  Denies any recent fever or chills.  Lumbar spinal stenosis and chronic pain syndrome: She has started taking oxycodone again, but she had to take more doses for abdominal pain. She has chronic, intermittent numbness and weakness of the LE along with chronic low back pain.  She recently went to [facility] ER for abdominal pain.  She had CT abdomen, which showed 3 mm nonobstructive renal stone.  She was treated for UTI.  She reports being told of colitis (Crohn's disease) in the past, but she has never had any colonoscopy.   Depression and ADHD: She is on Celexa 10 mg QD currently for MDD.  She states that she has been feeling depressed at home.  Her daughter is struggling with depression and she is anxious about it.  She has crying spells at home.  She denies any SI or HI currently.  She has been taking Adderall for ADHD as well.     Past Medical History:  Diagnosis Date   Anxiety    Disc disease, degenerative, cervical    Hypertension     Past Surgical History:  Procedure Laterality Date   CESAREAN SECTION      X 2   CHOLECYSTECTOMY N/A 09/07/2018   Procedure: LAPAROSCOPIC CHOLECYSTECTOMY;  Surgeon: Virl Cagey, MD;  Location: [facility];  Service: General;  Laterality: N/A;   LIVER BIOPSY N/A 09/07/2018   Procedure: LAPAROSCOPIC  LIVER BIOPSY;  Surgeon: Virl Cagey, MD;  Location: [facility];  Service: General;  Laterality: N/A;    Family History  Problem Relation Age of Onset   Diabetes Mother    CAD Mother    CAD Father    Diabetes Father    Throat cancer Father    Colon cancer Neg Hx    Colon polyps Neg Hx        no first degree relatives with polyps   Liver disease Neg Hx     Social History   Socioeconomic History   Marital status: Single    Spouse name: Not on file   Number of children: Not on file   Years of education: Not on file   Highest education level: Not on file  Occupational History   Not on file  Tobacco Use   Smoking status: Every Day    Packs/day: 1.00    Types: Cigarettes   Smokeless tobacco: Never  Vaping Use   Vaping Use: Never used  Substance and Sexual Activity   Alcohol use: No   Drug use:  Yes    Types: Marijuana    Comment: last use was today   Sexual activity: Not on file  Other Topics Concern   Not on file  Social History Narrative   Not on file   Social Determinants of Health   Financial Resource Strain: Not on file  Food Insecurity: Not on file  Transportation Needs: Not on file  Physical Activity: Not on file  Stress: Not on file  Social Connections: Not on file  Intimate Partner Violence: Not on file    Outpatient Medications Prior to Visit  Medication Sig Dispense Refill   albuterol (VENTOLIN HFA) 108 (90 Base) MCG/ACT inhaler Inhale 1-2 puffs into the lungs every 6 (six) hours as needed for wheezing or shortness of breath.     amphetamine-dextroamphetamine (ADDERALL) 20 MG tablet TAKE 1 TABLET BY MOUTH DAILY 30 tablet 0   aspirin EC 81 MG tablet Take 81 mg by mouth daily. Swallow whole.     rosuvastatin (CRESTOR) 10 MG tablet  Take 1 tablet (10 mg total) by mouth daily. 90 tablet 1   vitamin B-12 (CYANOCOBALAMIN) 500 MCG tablet Take 1 tablet (500 mcg total) by mouth daily.     citalopram (CELEXA) 20 MG tablet Take 1 tablet (20 mg total) by mouth daily. 30 tablet 2   losartan (COZAAR) 25 MG tablet TAKE 1 TABLET BY MOUTH DAILY 30 tablet 2   oxyCODONE (OXY IR/ROXICODONE) 5 MG immediate release tablet TAKE 1 TABLET BY MOUTH THREE TIMES DAILY AS NEEDED FOR PAIN 90 tablet 0   fluticasone (FLONASE) 50 MCG/ACT nasal spray Place 1 spray into both nostrils daily for 14 days. 16 g 0   No facility-administered medications prior to visit.    No Known Allergies  ROS Review of Systems  Constitutional:  Positive for fatigue. Negative for chills and fever.  HENT:  Negative for congestion, sinus pressure and sinus pain.   Eyes:  Negative for pain and discharge.  Respiratory:  Negative for cough and shortness of breath.   Cardiovascular:  Negative for chest pain and palpitations.  Gastrointestinal:  Negative for diarrhea, nausea and vomiting.  Genitourinary:  Negative for dysuria and hematuria.  Musculoskeletal:  Positive for arthralgias, back pain and neck pain. Negative for neck stiffness.  Skin:  Negative for rash.  Neurological:  Negative for dizziness and weakness.  Psychiatric/Behavioral:  Positive for agitation, decreased concentration, dysphoric mood and sleep disturbance. Negative for behavioral problems. The patient is nervous/anxious.       Objective:    Physical Exam Vitals reviewed.  Constitutional:      General: She is not in acute distress.    Appearance: She is obese. She is not diaphoretic.  HENT:     Head: Normocephalic and atraumatic.     Nose: Nose normal.     Mouth/Throat:     Mouth: Mucous membranes are moist.  Eyes:     General: No scleral icterus.    Extraocular Movements: Extraocular movements intact.  Cardiovascular:     Rate and Rhythm: Normal rate and regular rhythm.     Pulses: Normal  pulses.     Heart sounds: Normal heart sounds. No murmur heard. Pulmonary:     Breath sounds: Normal breath sounds. No wheezing or rales.  Abdominal:     Palpations: Abdomen is soft.     Tenderness: There is no abdominal tenderness.  Musculoskeletal:     Cervical back: Neck supple. No tenderness.     Right lower leg: No edema.  Left lower leg: No edema.  Skin:    General: Skin is warm.     Findings: No rash.  Neurological:     General: No focal deficit present.     Mental Status: She is alert and oriented to person, place, and time.     Cranial Nerves: No cranial nerve deficit.     Sensory: No sensory deficit.     Motor: Weakness (B/l LE - 4/5) present.  Psychiatric:        Mood and Affect: Mood is depressed. Affect is tearful.        Behavior: Behavior is withdrawn.     BP 104/62 (BP Location: Right Arm, Patient Position: Sitting, Cuff Size: Normal)   Pulse 66   Resp 18   Ht 5\' 3"  (1.6 m)   Wt 216 lb 3.2 oz (98.1 kg)   LMP 11/01/2008 (Approximate)   SpO2 92%   BMI 38.30 kg/m  Wt Readings from Last 3 Encounters:  10/29/21 216 lb 3.2 oz (98.1 kg)  07/16/21 216 lb 12.8 oz (98.3 kg)  05/16/21 245 lb (111.1 kg)    Lab Results  Component Value Date   TSH 2.560 07/16/2021   Lab Results  Component Value Date   WBC 9.4 07/16/2021   HGB 12.8 07/16/2021   HCT 38.3 07/16/2021   MCV 83 07/16/2021   PLT 330 07/16/2021   Lab Results  Component Value Date   NA 137 07/16/2021   K 3.4 (L) 07/16/2021   CO2 25 07/16/2021   GLUCOSE 97 07/16/2021   BUN 10 07/16/2021   CREATININE 0.85 07/16/2021   BILITOT 0.4 07/16/2021   ALKPHOS 100 07/16/2021   AST 14 07/16/2021   ALT 14 07/16/2021   PROT 7.3 07/16/2021   ALBUMIN 4.6 07/16/2021   CALCIUM 9.4 07/16/2021   ANIONGAP 10 12/15/2019   EGFR 83 07/16/2021   Lab Results  Component Value Date   CHOL 204 (H) 07/16/2021   Lab Results  Component Value Date   HDL 35 (L) 07/16/2021   Lab Results  Component Value Date    LDLCALC 148 (H) 07/16/2021   Lab Results  Component Value Date   TRIG 116 07/16/2021   Lab Results  Component Value Date   CHOLHDL 5.8 (H) 07/16/2021   Lab Results  Component Value Date   HGBA1C 5.5 07/16/2021      Assessment & Plan:   Problem List Items Addressed This Visit       Cardiovascular and Mediastinum   Essential hypertension - Primary    BP Readings from Last 1 Encounters:  10/29/21 104/62  Well controlled with losartan 25 mg QD now Counseled for compliance with the medications Advised DASH diet and moderate exercise/walking, at least 150 mins/week      Relevant Medications   losartan (COZAAR) 25 MG tablet     Respiratory   COPD (chronic obstructive pulmonary disease) (HCC)    Uncontrolled with Albuterol inhaler PRN Added Stiolto as maintenance inhaler      Relevant Medications   Tiotropium Bromide-Olodaterol (STIOLTO RESPIMAT) 2.5-2.5 MCG/ACT AERS     Other   Chronic pain syndrome    Was managed by Dr. 12/29/21  Due to underlying DDD of lumbar spine Has been evaluated by spine surgeon, patient did not want any surgical intervention Currently in severe pain Refilled oxycodone 5 mg 3 times daily as needed for now -  Warned to avoid early refills, will allow just today  Warned to avoid THC products  Relevant Medications   citalopram (CELEXA) 20 MG tablet   oxyCODONE (OXY IR/ROXICODONE) 5 MG immediate release tablet (Start on 10/30/2021)   Moderate episode of recurrent major depressive disorder (HCC)    Uncontrolled currently On Celexa to 20 mg daily now Avoid adding multiple medication for now      Relevant Medications   citalopram (CELEXA) 20 MG tablet   Attention deficit hyperactivity disorder (ADHD)    On Adderall 20 mg daily Refilled for now      Spinal stenosis of lumbar region with neurogenic claudication    Last MRI lumbar spine reviewed - Progressive spondylosis at L4-L5 with worsening now moderate spinal canal stenosis.  Unchanged mild to moderate bilateral neuroforaminal stenosis.  Has been evaluated by spine surgery, patient did not prefer surgical intervention. On chronic opioids for chronic low back pain      Relevant Medications   citalopram (CELEXA) 20 MG tablet   oxyCODONE (OXY IR/ROXICODONE) 5 MG immediate release tablet (Start on 10/30/2021)   Anxiety    On Celexa Would avoid BZD as she is on chronic opiates      Relevant Medications   citalopram (CELEXA) 20 MG tablet   Chronic abdominal pain    Unclear etiology Has chronic diarrhea as well Recent CT abdomen showed nonobstructive renal stone, less likely to be a reason for chronic abdominal pain Referred to GI      Relevant Medications   citalopram (CELEXA) 20 MG tablet   oxyCODONE (OXY IR/ROXICODONE) 5 MG immediate release tablet (Start on 10/30/2021)   Other Relevant Orders   Ambulatory referral to Gastroenterology   Other Visit Diagnoses     Need for varicella vaccine       Relevant Orders   Zoster Recombinant (Shingrix ) (Completed)       Meds ordered this encounter  Medications   Tiotropium Bromide-Olodaterol (STIOLTO RESPIMAT) 2.5-2.5 MCG/ACT AERS    Sig: Inhale 2 puffs into the lungs daily.    Dispense:  4 g    Refill:  11   losartan (COZAAR) 25 MG tablet    Sig: Take 1 tablet (25 mg total) by mouth daily.    Dispense:  90 tablet    Refill:  1   citalopram (CELEXA) 20 MG tablet    Sig: Take 1 tablet (20 mg total) by mouth daily.    Dispense:  90 tablet    Refill:  1   DISCONTD: oxyCODONE (OXY IR/ROXICODONE) 5 MG immediate release tablet    Sig: TAKE 1 TABLET BY MOUTH THREE TIMES DAILY AS NEEDED FOR PAIN    Dispense:  90 tablet    Refill:  0   oxyCODONE (OXY IR/ROXICODONE) 5 MG immediate release tablet    Sig: TAKE 1 TABLET BY MOUTH THREE TIMES DAILY AS NEEDED FOR PAIN    Dispense:  90 tablet    Refill:  0    Okay to fill on 08/02 - Patient warned of avoiding early refills.    Follow-up: Return in about 3  months (around 01/29/2022) for Chronic pain and ADHD.    Lindell Spar, MD

## 2021-10-29 NOTE — Assessment & Plan Note (Signed)
Last MRI lumbar spine reviewed - Progressive spondylosis at L4-L5 with worsening now moderate spinal canal stenosis. Unchanged mild to moderate bilateral neuroforaminal stenosis.  Has been evaluated by spine surgery, patient did not prefer surgical intervention. On chronic opioids for chronic low back pain 

## 2021-10-30 ENCOUNTER — Encounter: Payer: Self-pay | Admitting: *Deleted

## 2021-10-30 NOTE — Telephone Encounter (Signed)
Attempted to call patient NA NVM I did call pharmacy yesterday and Allena Katz has resent prescription saying it is ok to fill on 8-2. Ultimately this is up to the pharmacist if they will fill early and refill is not due until 8-5

## 2021-11-20 NOTE — Progress Notes (Deleted)
GI Office Note    Referring Provider: Anabel Halon, MD Primary Care Physician:  Anabel Halon, MD  Primary Gastroenterologist:   Chief Complaint   No chief complaint on file.    History of Present Illness   Leslie Lowery is a 51 y.o. female presenting today at the request of Anabel Halon, MD for ***   Last seen in the office July 2020 for hospital follow-up for dyspepsia, nausea, diarrhea, and right lower quadrant abdominal pain.  Of note patient is status postcholecystectomy.  She had fatty liver and hepatomegaly on imaging with normal LFTs.  It was felt to be that hepatomegaly was due to fatty liver disease.  She had no evidence of a large spleen or thrombocytopenia.  Mild steatosis on liver biopsy at time of cholecystectomy.  Due to increased NSAIDs, consideration raised for EGD as outpatient persistent epigastric pain.  Patient was taking ibuprofen 3 times a day and Goody powders at least once a day.  At the time of visit patient reported she had a few days of more constant right lower quadrant pain that radiated across to her umbilicus.  She reported it was hard to rest and movement causes worsening discomfort with a stabbing pain.  She reported nausea as well as no appetite or taste for food.  Eating causes discomfort in her upper abdomen.  She has history of looser stools prior to cholecystectomy the pain when she ate.  Currently reporting intermittent watery stool 3-4 times per day.  She was given Bentyl in the ED which she took for 4 to 5 days she denied weight loss.  She also admitted to not taking Protonix on regular basis.  She has CT A/P with contrast in June 2020 with postop changes, possible appendicolith that was reviewed with Dr. Henreitta Leber.  Felt as a appendicolith was very patent and there was no signs of appendicitis.  Reportedly patient had drug-seeking behaviors.  She was given trial of Levsin.  She was to schedule upper endoscopy with Dr. Jena Gauss and advised to  resume her PPI twice daily.  See telephone notes in July 2020 regarding patient's pain.  Patient was no-show for her COVID test prior to her endoscopy in September 2020 therefore procedure was canceled.  Patient was a no-show for her office visit in October 2020.  Patient was scheduled for an appointment on 08/06/2021 but was unable to be reached about patient coming in at a different time for her appointment.  It appears her appointment was rescheduled to 08/15/2021 and patient was a no-show.  2 recent ED visits at [facility] on 7/4 and 7/7 for right lower quadrant abdominal pain.  On 7/4 she presented with RLQ pain that radiates to her back that was worse with moving, walking, and trying to lay down.  He reported some nausea with intermittent vomiting.  She denied any urinary symptoms.  She was given 1 L of IV fluids as well as Dilaudid for pain and Zofran for nausea.  UA showed few bacteria and no nitrites or leukocytes.  Chemistry, CBC, lipase all normal.  CT scan did a 3 mm left renal stone without ureteral stones and mild bladder wall thickening concerning for cystitis.  She was given Keflex and Pyridium.  On 7/7 she reported persistent RLQ pain and tenderness, concern for sepsis, and concerned about leg swelling.  EKG that was essentially unremarkable.  Sepsis markers were negative.  She was advised to stay hydrated.  She was scheduled  for outpatient Doppler ultrasound of her leg to rule out DVT and was advised to follow-up with her PCP in 12 to 24 hours.  Per visit with PCP on 10/29/2021 patient reported taking more doses of her oxycodone due to her abdominal pain.  She reported to her PCP being told she had colitis or possible Crohn's disease in the past but has never had colonoscopy.  She also reported worsening dyspnea and wheezing related to her COPD.  Reportedly was still smoking but trying to cut down.  Needing to use her albuterol inhaler about 4 times per day.  She was given strict  instructions regarding her chronic pain management with opioids and advised to avoid THC products.   Today:     Current Outpatient Medications  Medication Sig Dispense Refill   albuterol (VENTOLIN HFA) 108 (90 Base) MCG/ACT inhaler Inhale 1-2 puffs into the lungs every 6 (six) hours as needed for wheezing or shortness of breath.     amphetamine-dextroamphetamine (ADDERALL) 20 MG tablet TAKE 1 TABLET BY MOUTH DAILY 30 tablet 0   aspirin EC 81 MG tablet Take 81 mg by mouth daily. Swallow whole.     citalopram (CELEXA) 20 MG tablet Take 1 tablet (20 mg total) by mouth daily. 90 tablet 1   fluticasone (FLONASE) 50 MCG/ACT nasal spray Place 1 spray into both nostrils daily for 14 days. 16 g 0   losartan (COZAAR) 25 MG tablet Take 1 tablet (25 mg total) by mouth daily. 90 tablet 1   oxyCODONE (OXY IR/ROXICODONE) 5 MG immediate release tablet TAKE 1 TABLET BY MOUTH THREE TIMES DAILY AS NEEDED FOR PAIN 90 tablet 0   rosuvastatin (CRESTOR) 10 MG tablet Take 1 tablet (10 mg total) by mouth daily. 90 tablet 1   Tiotropium Bromide-Olodaterol (STIOLTO RESPIMAT) 2.5-2.5 MCG/ACT AERS Inhale 2 puffs into the lungs daily. 4 g 11   vitamin B-12 (CYANOCOBALAMIN) 500 MCG tablet Take 1 tablet (500 mcg total) by mouth daily.     No current facility-administered medications for this visit.    Past Medical History:  Diagnosis Date   Anxiety    Disc disease, degenerative, cervical    Hypertension     Past Surgical History:  Procedure Laterality Date   CESAREAN SECTION     X 2   CHOLECYSTECTOMY N/A 09/07/2018   Procedure: LAPAROSCOPIC CHOLECYSTECTOMY;  Surgeon: Lucretia Roers, MD;  Location: [facility];  Service: General;  Laterality: N/A;   LIVER BIOPSY N/A 09/07/2018   Procedure: LAPAROSCOPIC  LIVER BIOPSY;  Surgeon: Lucretia Roers, MD;  Location: [facility];  Service: General;  Laterality: N/A;    Family History  Problem Relation Age of Onset   Diabetes Mother    CAD Mother    CAD Father     Diabetes Father    Throat cancer Father    Colon cancer Neg Hx    Colon polyps Neg Hx        no first degree relatives with polyps   Liver disease Neg Hx     Allergies as of 11/21/2021   (No Known Allergies)    Social History   Socioeconomic History   Marital status: Single    Spouse name: Not on file   Number of children: Not on file   Years of education: Not on file   Highest education level: Not on file  Occupational History   Not on file  Tobacco Use   Smoking status: Every Day    Packs/day: 1.00  Types: Cigarettes   Smokeless tobacco: Never  Vaping Use   Vaping Use: Never used  Substance and Sexual Activity   Alcohol use: No   Drug use: Yes    Types: Marijuana    Comment: last use was today   Sexual activity: Not on file  Other Topics Concern   Not on file  Social History Narrative   Not on file   Social Determinants of Health   Financial Resource Strain: Not on file  Food Insecurity: Not on file  Transportation Needs: Not on file  Physical Activity: Not on file  Stress: Not on file  Social Connections: Not on file  Intimate Partner Violence: Not on file     Review of Systems   Gen: Denies any fever, chills, fatigue, weight loss, lack of appetite.  CV: Denies chest pain, heart palpitations, peripheral edema, syncope.  Resp: Denies shortness of breath at rest or with exertion. Denies wheezing or cough.  GI: see HPI GU : Denies urinary burning, urinary frequency, urinary hesitancy MS: Denies joint pain, muscle weakness, cramps, or limitation of movement.  Derm: Denies rash, itching, dry skin Psych: Denies depression, anxiety, memory loss, and confusion Heme: Denies bruising, bleeding, and enlarged lymph nodes.   Physical Exam   LMP 11/01/2008 (Approximate)   General:   Alert and oriented. Pleasant and cooperative. Well-nourished and well-developed.  Head:  Normocephalic and atraumatic. Eyes:  Without icterus, sclera clear and conjunctiva  pink.  Ears:  Normal auditory acuity. Mouth:  No deformity or lesions, oral mucosa pink.  Lungs:  Clear to auscultation bilaterally. No wheezes, rales, or rhonchi. No distress.  Heart:  S1, S2 present without murmurs appreciated.  Abdomen:  +BS, soft, non-tender and non-distended. No HSM noted. No guarding or rebound. No masses appreciated.  Rectal:  Deferred *** Msk:  Symmetrical without gross deformities. Normal posture. Extremities:  Without edema. Neurologic:  Alert and  oriented x4;  grossly normal neurologically. Skin:  Intact without significant lesions or rashes. Psych:  Alert and cooperative. Normal mood and affect.   Assessment   Leslie Lowery is a 51 y.o. female with a history of hypertension, anxiety and depression, ADHD, COPD degenerative cervical disc disease, and lumbar spinal stenosis with chronic pain syndrome*** presenting today for evaluation of chronic abdominal pain.  Chronic abdominal pain: Described as mostly in the right lower quadrant.  She is postcholecystectomy in 2020.  She has had extensive work-up with multiple abdominal images with most recent being in early July without any significant intra-abdominal process other than mild cystitis and left renal calculus and noninflamed diverticula in the sigmoid colon.  No prior endoscopies on file.  Most recent lab work was unremarkable with CBC, CMP, lactate, and lipase all unremarkable.  Did have some mild anemia with hemoglobin 11.2 on 10/04/2021.   PLAN   ***    Brooke Bonito, MSN, FNP-BC, AGACNP-BC [facility]

## 2021-11-21 ENCOUNTER — Ambulatory Visit: Payer: Medicaid Other | Admitting: Gastroenterology

## 2021-11-25 ENCOUNTER — Other Ambulatory Visit: Payer: Self-pay | Admitting: Internal Medicine

## 2021-11-25 DIAGNOSIS — M48062 Spinal stenosis, lumbar region with neurogenic claudication: Secondary | ICD-10-CM

## 2021-11-25 MED ORDER — AMPHETAMINE-DEXTROAMPHETAMINE 20 MG PO TABS: ORAL_TABLET | ORAL | 0 refills | Status: DC

## 2021-11-25 MED ORDER — OXYCODONE HCL 5 MG PO TABS: ORAL_TABLET | ORAL | 0 refills | Status: DC

## 2021-12-23 ENCOUNTER — Other Ambulatory Visit: Payer: Self-pay | Admitting: Internal Medicine

## 2021-12-23 DIAGNOSIS — M48062 Spinal stenosis, lumbar region with neurogenic claudication: Secondary | ICD-10-CM

## 2021-12-23 MED ORDER — AMPHETAMINE-DEXTROAMPHETAMINE 20 MG PO TABS: ORAL_TABLET | ORAL | 0 refills | Status: DC

## 2021-12-23 MED ORDER — OXYCODONE HCL 5 MG PO TABS: ORAL_TABLET | ORAL | 0 refills | Status: DC

## 2021-12-28 NOTE — Progress Notes (Signed)
Patient no show for appointment.

## 2022-01-22 ENCOUNTER — Other Ambulatory Visit: Payer: Self-pay | Admitting: Internal Medicine

## 2022-01-22 DIAGNOSIS — M48062 Spinal stenosis, lumbar region with neurogenic claudication: Secondary | ICD-10-CM

## 2022-01-22 MED ORDER — AMPHETAMINE-DEXTROAMPHETAMINE 20 MG PO TABS: ORAL_TABLET | ORAL | 0 refills | Status: DC

## 2022-01-22 MED ORDER — OXYCODONE HCL 5 MG PO TABS: ORAL_TABLET | ORAL | 0 refills | Status: DC

## 2022-01-29 ENCOUNTER — Ambulatory Visit (INDEPENDENT_AMBULATORY_CARE_PROVIDER_SITE_OTHER): Payer: Medicaid Other | Admitting: Internal Medicine

## 2022-01-29 ENCOUNTER — Encounter: Payer: Self-pay | Admitting: Internal Medicine

## 2022-01-29 VITALS — BP 132/80 | HR 107 | Resp 18 | Ht 63.0 in | Wt 208.8 lb

## 2022-01-29 DIAGNOSIS — J439 Emphysema, unspecified: Secondary | ICD-10-CM

## 2022-01-29 DIAGNOSIS — Z1211 Encounter for screening for malignant neoplasm of colon: Secondary | ICD-10-CM

## 2022-01-29 DIAGNOSIS — M48062 Spinal stenosis, lumbar region with neurogenic claudication: Secondary | ICD-10-CM | POA: Diagnosis not present

## 2022-01-29 DIAGNOSIS — E782 Mixed hyperlipidemia: Secondary | ICD-10-CM | POA: Diagnosis not present

## 2022-01-29 DIAGNOSIS — Z8673 Personal history of transient ischemic attack (TIA), and cerebral infarction without residual deficits: Secondary | ICD-10-CM | POA: Diagnosis not present

## 2022-01-29 DIAGNOSIS — F331 Major depressive disorder, recurrent, moderate: Secondary | ICD-10-CM | POA: Diagnosis not present

## 2022-01-29 DIAGNOSIS — Z720 Tobacco use: Secondary | ICD-10-CM | POA: Diagnosis not present

## 2022-01-29 DIAGNOSIS — I10 Essential (primary) hypertension: Secondary | ICD-10-CM

## 2022-01-29 DIAGNOSIS — E559 Vitamin D deficiency, unspecified: Secondary | ICD-10-CM

## 2022-01-29 DIAGNOSIS — F17210 Nicotine dependence, cigarettes, uncomplicated: Secondary | ICD-10-CM

## 2022-01-29 DIAGNOSIS — Z122 Encounter for screening for malignant neoplasm of respiratory organs: Secondary | ICD-10-CM

## 2022-01-29 DIAGNOSIS — Z2821 Immunization not carried out because of patient refusal: Secondary | ICD-10-CM

## 2022-01-29 DIAGNOSIS — G459 Transient cerebral ischemic attack, unspecified: Secondary | ICD-10-CM

## 2022-01-29 DIAGNOSIS — R7303 Prediabetes: Secondary | ICD-10-CM

## 2022-01-29 MED ORDER — STIOLTO RESPIMAT 2.5-2.5 MCG/ACT IN AERS: 2.0000 | INHALATION_SPRAY | Freq: Every day | RESPIRATORY_TRACT | 11 refills | Status: DC

## 2022-01-29 MED ORDER — CITALOPRAM HYDROBROMIDE 20 MG PO TABS: 20.0000 mg | ORAL_TABLET | Freq: Every day | ORAL | 1 refills | Status: DC

## 2022-01-29 MED ORDER — ROSUVASTATIN CALCIUM 10 MG PO TABS: 10.0000 mg | ORAL_TABLET | Freq: Every day | ORAL | 1 refills | Status: DC

## 2022-01-29 MED ORDER — LOSARTAN POTASSIUM 25 MG PO TABS: 25.0000 mg | ORAL_TABLET | Freq: Every day | ORAL | 1 refills | Status: DC

## 2022-01-29 NOTE — Patient Instructions (Signed)
Please use Stiolto regularly.  Please take Citalopram as prescribed for depression.  Please continue taking other medications as prescribed.  Please continue to follow low salt diet and ambulate as tolerated.

## 2022-01-29 NOTE — Progress Notes (Unsigned)
cologuard

## 2022-01-30 ENCOUNTER — Encounter: Payer: Self-pay | Admitting: Internal Medicine

## 2022-01-30 NOTE — Assessment & Plan Note (Signed)
Last MRI lumbar spine reviewed - Progressive spondylosis at L4-L5 with worsening now moderate spinal canal stenosis. Unchanged mild to moderate bilateral neuroforaminal stenosis.  Has been evaluated by spine surgery, patient did not prefer surgical intervention. On chronic opioids for chronic low back pain

## 2022-01-30 NOTE — Assessment & Plan Note (Signed)
Lipid profile reviewed from care everywhere Started Crestor

## 2022-01-30 NOTE — Assessment & Plan Note (Addendum)
Smokes about 0.5 pack/day, has cut down from 1 pack/day  Asked about quitting: confirms that he/she currently smokes cigarettes Advise to quit smoking: Educated about QUITTING to reduce the risk of cancer, cardio and cerebrovascular disease. Assess willingness: Unwilling to quit at this time, but is working on cutting back. Assist with counseling and pharmacotherapy: Counseled for 5 minutes and literature provided. Arrange for follow up: follow up and continue to offer help. 

## 2022-01-30 NOTE — Progress Notes (Signed)
Established Patient Office Visit  Subjective:  Patient ID: Leslie Lowery, female    DOB: September 26, 1970  Age: 51 y.o. MRN: [id number]  CC:  Chief Complaint  Patient presents with   Follow-up    Follow up pain and adhd pt sob is worse sometimes she wakes up and cant breathe    HPI Leslie Lowery is a 51 y.o. female with past medical history of HTN, chronic pain syndrome secondary to lumbar spinal stenosis, MDD, ADHD, morbid obesity and tobacco abuse who presents for f/u of her chronic medical conditions.  HTN: Her BP is better controlled now with losartan.  She denies any headache or dizziness now.  Denies any chest pain or palpitations.  Lumbar spinal stenosis and chronic pain syndrome: She has been taking oxycodone.  She has chronic, intermittent numbness and weakness of the LE along with chronic low back pain.  Depression and ADHD: She is on Celexa 20 mg QD currently for MDD.  She states that she has been feeling depressed at home. She has crying spells at home.  Of note, she has run out of her Celexa as she has not been refilling it due to financial constraints.  She denies any SI or HI currently.  She has been taking Adderall for ADHD as well.  COPD: She is still smoking about half pack per day.  She reports increasing dyspnea despite using albuterol inhaler.  She was prescribed Stiolto inhaler, but her compliance is questionable.  She has chronic cough, but denies hemoptysis, night sweats or weight loss.    Past Medical History:  Diagnosis Date   Anxiety    Disc disease, degenerative, cervical    Hypertension     Past Surgical History:  Procedure Laterality Date   CESAREAN SECTION     X 2   CHOLECYSTECTOMY N/A 09/07/2018   Procedure: LAPAROSCOPIC CHOLECYSTECTOMY;  Surgeon: Virl Cagey, MD;  Location: [facility];  Service: General;  Laterality: N/A;   LIVER BIOPSY N/A 09/07/2018   Procedure: LAPAROSCOPIC  LIVER BIOPSY;  Surgeon: Virl Cagey, MD;  Location: [facility];  Service: General;  Laterality: N/A;    Family History  Problem Relation Age of Onset   Diabetes Mother    CAD Mother    CAD Father    Diabetes Father    Throat cancer Father    Colon cancer Neg Hx    Colon polyps Neg Hx        no first degree relatives with polyps   Liver disease Neg Hx     Social History   Socioeconomic History   Marital status: Single    Spouse name: Not on file   Number of children: Not on file   Years of education: Not on file   Highest education level: Not on file  Occupational History   Not on file  Tobacco Use   Smoking status: Every Day    Packs/day: 1.00    Types: Cigarettes   Smokeless tobacco: Never  Vaping Use   Vaping Use: Never used  Substance and Sexual Activity   Alcohol use: No   Drug use: Yes    Types: Marijuana    Comment: last use was today   Sexual activity: Not on file  Other Topics Concern   Not on file  Social History Narrative   Not on file   Social Determinants of Health   Financial Resource Strain: Not on file  Food Insecurity: Not on file  Transportation Needs:  Not on file  Physical Activity: Not on file  Stress: Not on file  Social Connections: Not on file  Intimate Partner Violence: Not on file    Outpatient Medications Prior to Visit  Medication Sig Dispense Refill   albuterol (VENTOLIN HFA) 108 (90 Base) MCG/ACT inhaler Inhale 1-2 puffs into the lungs every 6 (six) hours as needed for wheezing or shortness of breath.     amphetamine-dextroamphetamine (ADDERALL) 20 MG tablet TAKE 1 TABLET BY MOUTH DAILY 30 tablet 0   aspirin EC 81 MG tablet Take 81 mg by mouth daily. Swallow whole.     oxyCODONE (OXY IR/ROXICODONE) 5 MG immediate release tablet TAKE 1 TABLET BY MOUTH THREE TIMES DAILY AS NEEDED FOR PAIN 90 tablet 0   vitamin B-12 (CYANOCOBALAMIN) 500 MCG tablet Take 1 tablet (500 mcg total) by mouth daily.     citalopram (CELEXA) 20 MG tablet Take 1 tablet (20 mg total) by mouth daily. 90 tablet 1    losartan (COZAAR) 25 MG tablet Take 1 tablet (25 mg total) by mouth daily. 90 tablet 1   rosuvastatin (CRESTOR) 10 MG tablet Take 1 tablet (10 mg total) by mouth daily. 90 tablet 1   Tiotropium Bromide-Olodaterol (STIOLTO RESPIMAT) 2.5-2.5 MCG/ACT AERS Inhale 2 puffs into the lungs daily. 4 g 11   fluticasone (FLONASE) 50 MCG/ACT nasal spray Place 1 spray into both nostrils daily for 14 days. 16 g 0   No facility-administered medications prior to visit.    No Known Allergies  ROS Review of Systems  Constitutional:  Positive for fatigue. Negative for chills and fever.  HENT:  Negative for congestion, sinus pressure and sinus pain.   Eyes:  Negative for pain and discharge.  Respiratory:  Positive for cough and shortness of breath.   Cardiovascular:  Negative for chest pain and palpitations.  Gastrointestinal:  Negative for diarrhea, nausea and vomiting.  Genitourinary:  Negative for dysuria and hematuria.  Musculoskeletal:  Positive for arthralgias, back pain and neck pain. Negative for neck stiffness.  Skin:  Negative for rash.  Neurological:  Negative for dizziness and weakness.  Psychiatric/Behavioral:  Positive for agitation, decreased concentration, dysphoric mood and sleep disturbance. Negative for behavioral problems. The patient is nervous/anxious.       Objective:    Physical Exam Vitals reviewed.  Constitutional:      General: She is not in acute distress.    Appearance: She is obese. She is not diaphoretic.  HENT:     Head: Normocephalic and atraumatic.     Nose: Nose normal.     Mouth/Throat:     Mouth: Mucous membranes are moist.  Eyes:     General: No scleral icterus.    Extraocular Movements: Extraocular movements intact.  Cardiovascular:     Rate and Rhythm: Normal rate and regular rhythm.     Pulses: Normal pulses.     Heart sounds: Normal heart sounds. No murmur heard. Pulmonary:     Breath sounds: Wheezing (Bilateral, diffuse) present. No rales.   Abdominal:     Palpations: Abdomen is soft.     Tenderness: There is no abdominal tenderness.  Musculoskeletal:     Cervical back: Neck supple. No tenderness.     Right lower leg: No edema.     Left lower leg: No edema.  Skin:    General: Skin is warm.     Findings: No rash.  Neurological:     General: No focal deficit present.     Mental Status:  She is alert and oriented to person, place, and time.     Cranial Nerves: No cranial nerve deficit.     Sensory: No sensory deficit.     Motor: Weakness (B/l LE - 4/5) present.  Psychiatric:        Mood and Affect: Mood is depressed. Affect is tearful.        Behavior: Behavior is withdrawn.     BP 132/80 (BP Location: Right Arm, Patient Position: Sitting, Cuff Size: Normal)   Pulse (!) 107   Resp 18   Ht _0  (1.6 m)   Wt 208 lb 12.8 oz (94.7 kg)   LMP 11/01/2008 (Approximate)   SpO2 96%   BMI 36.99 kg/m  Wt Readings from Last 3 Encounters:  01/29/22 208 lb 12.8 oz (94.7 kg)  10/29/21 216 lb 3.2 oz (98.1 kg)  07/16/21 216 lb 12.8 oz (98.3 kg)    Lab Results  Component Value Date   TSH 2.560 07/16/2021   Lab Results  Component Value Date   WBC 9.4 07/16/2021   HGB 12.8 07/16/2021   HCT 38.3 07/16/2021   MCV 83 07/16/2021   PLT 330 07/16/2021   Lab Results  Component Value Date   NA 137 07/16/2021   K 3.4 (L) 07/16/2021   CO2 25 07/16/2021   GLUCOSE 97 07/16/2021   BUN 10 07/16/2021   CREATININE 0.85 07/16/2021   BILITOT 0.4 07/16/2021   ALKPHOS 100 07/16/2021   AST 14 07/16/2021   ALT 14 07/16/2021   PROT 7.3 07/16/2021   ALBUMIN 4.6 07/16/2021   CALCIUM 9.4 07/16/2021   ANIONGAP 10 12/15/2019   EGFR 83 07/16/2021   Lab Results  Component Value Date   CHOL 204 (H) 07/16/2021   Lab Results  Component Value Date   HDL 35 (L) 07/16/2021   Lab Results  Component Value Date   LDLCALC 148 (H) 07/16/2021   Lab Results  Component Value Date   TRIG 116 07/16/2021   Lab Results  Component Value  Date   CHOLHDL 5.8 (H) 07/16/2021   Lab Results  Component Value Date   HGBA1C 5.5 07/16/2021      Assessment & Plan:   Problem List Items Addressed This Visit       Cardiovascular and Mediastinum   Essential hypertension    BP Readings from Last 1 Encounters:  01/29/22 132/80  Well controlled with losartan 25 mg QD now Counseled for compliance with the medications Advised DASH diet and moderate exercise/walking, at least 150 mins/week      Relevant Medications   losartan (COZAAR) 25 MG tablet   rosuvastatin (CRESTOR) 10 MG tablet   Other Relevant Orders   TSH   CMP14+EGFR   CBC with Differential/Platelet     Respiratory   COPD (chronic obstructive pulmonary disease) ([place]) - Primary    Uncontrolled with Albuterol inhaler PRN Added Stiolto as maintenance inhaler, but compliance is questionable Needs to quit smoking Low low-dose CT chest for lung cancer screening Referred to pulmonology      Relevant Medications   Tiotropium Bromide-Olodaterol (STIOLTO RESPIMAT) 2.5-2.5 MCG/ACT AERS   Other Relevant Orders   Ambulatory referral to Pulmonology   CT Chest Wo Contrast     Other   Tobacco abuse    Smokes about 0.5 pack/day, has cut down from 1 pack/day  Asked about quitting: confirms that he/she currently smokes cigarettes Advise to quit smoking: Educated about QUITTING to reduce the risk of cancer, cardio and cerebrovascular disease.  Assess willingness: Unwilling to quit at this time, but is working on cutting back. Assist with counseling and pharmacotherapy: Counseled for 5 minutes and literature provided. Arrange for follow up: follow up and continue to offer help.      Moderate episode of recurrent major depressive disorder (HCC)    Uncontrolled currently On Celexa to 20 mg daily now, needs to be compliant Avoid adding multiple medication for now      Relevant Medications   citalopram (CELEXA) 20 MG tablet   Spinal stenosis of lumbar region with  neurogenic claudication    Last MRI lumbar spine reviewed - Progressive spondylosis at L4-L5 with worsening now moderate spinal canal stenosis. Unchanged mild to moderate bilateral neuroforaminal stenosis.  Has been evaluated by spine surgery, patient did not prefer surgical intervention. On chronic opioids for chronic low back pain      Relevant Medications   citalopram (CELEXA) 20 MG tablet   History of CVA (cerebrovascular accident)    Recent hospitalization for TIA Had right-sided facial and UE numbness, now resolved Has chronic right-sided UE and LE weakness, referred to PT Continue aspirin On statin      Mixed hyperlipidemia    Lipid profile reviewed from care everywhere Started Crestor      Relevant Medications   losartan (COZAAR) 25 MG tablet   rosuvastatin (CRESTOR) 10 MG tablet   Other Relevant Orders   Lipid panel   Refused influenza vaccine   Other Visit Diagnoses     Vitamin D deficiency       Relevant Orders   VITAMIN D 25 Hydroxy (Vit-D Deficiency, Fractures)   Prediabetes       Relevant Orders   Hemoglobin A1c   Special screening for malignant neoplasms, colon           Meds ordered this encounter  Medications   citalopram (CELEXA) 20 MG tablet    Sig: Take 1 tablet (20 mg total) by mouth daily.    Dispense:  90 tablet    Refill:  1   losartan (COZAAR) 25 MG tablet    Sig: Take 1 tablet (25 mg total) by mouth daily.    Dispense:  90 tablet    Refill:  1   rosuvastatin (CRESTOR) 10 MG tablet    Sig: Take 1 tablet (10 mg total) by mouth daily.    Dispense:  90 tablet    Refill:  1   Tiotropium Bromide-Olodaterol (STIOLTO RESPIMAT) 2.5-2.5 MCG/ACT AERS    Sig: Inhale 2 puffs into the lungs daily.    Dispense:  4 g    Refill:  11    Follow-up: Return in about 3 months (around 05/01/2022) for Annual physical.    Lindell Spar, MD

## 2022-01-30 NOTE — Assessment & Plan Note (Signed)
Uncontrolled currently On Celexa to 20 mg daily now, needs to be compliant Avoid adding multiple medication for now

## 2022-01-30 NOTE — Assessment & Plan Note (Signed)
Uncontrolled with Albuterol inhaler PRN Added Stiolto as maintenance inhaler, but compliance is questionable Needs to quit smoking Low low-dose CT chest for lung cancer screening Referred to pulmonology 

## 2022-01-30 NOTE — Assessment & Plan Note (Addendum)
Recent hospitalization for TIA Had right-sided facial and UE numbness, now resolved Has chronic right-sided UE and LE weakness, referred to PT Continue aspirin On statin

## 2022-01-30 NOTE — Assessment & Plan Note (Signed)
BP Readings from Last 1 Encounters:  01/29/22 132/80   Well controlled with losartan 25 mg QD now Counseled for compliance with the medications Advised DASH diet and moderate exercise/walking, at least 150 mins/week

## 2022-02-04 ENCOUNTER — Telehealth: Payer: Self-pay | Admitting: Internal Medicine

## 2022-02-04 NOTE — Addendum Note (Signed)
Addended byIhor Dow on: 02/04/2022 07:17 PM   Modules accepted: Orders

## 2022-02-04 NOTE — Telephone Encounter (Signed)
Leslie Lowery, [employer] stating the authorization request had been cancelled because it was too confusing and she did not understand it. States she has called multiple places to find you because the # was wrong?? States she will be faxing over the information.

## 2022-02-13 ENCOUNTER — Ambulatory Visit (HOSPITAL_COMMUNITY): Payer: Medicaid Other

## 2022-02-18 ENCOUNTER — Other Ambulatory Visit: Payer: Self-pay | Admitting: Family Medicine

## 2022-02-18 ENCOUNTER — Other Ambulatory Visit: Payer: Self-pay | Admitting: Internal Medicine

## 2022-02-18 DIAGNOSIS — M48062 Spinal stenosis, lumbar region with neurogenic claudication: Secondary | ICD-10-CM

## 2022-02-18 DIAGNOSIS — F908 Attention-deficit hyperactivity disorder, other type: Secondary | ICD-10-CM

## 2022-02-18 MED ORDER — OXYCODONE HCL 5 MG PO TABS: ORAL_TABLET | ORAL | 0 refills | Status: DC

## 2022-02-18 MED ORDER — AMPHETAMINE-DEXTROAMPHETAMINE 20 MG PO TABS: ORAL_TABLET | ORAL | 0 refills | Status: DC

## 2022-02-18 NOTE — Telephone Encounter (Signed)
Rx sent 

## 2022-02-19 ENCOUNTER — Telehealth: Payer: Self-pay

## 2022-02-19 ENCOUNTER — Telehealth: Payer: Self-pay | Admitting: Internal Medicine

## 2022-02-19 NOTE — Telephone Encounter (Signed)
Pt was transferred to [facility] because she was being rude & hateful and was demanding I go & fix her medication. Attempted to explain to her that Dr. Allena Katz is out of the country & another provider refilled to hold her over till Dr. Allena Katz returned on Monday.

## 2022-02-23 ENCOUNTER — Other Ambulatory Visit: Payer: Self-pay | Admitting: Internal Medicine

## 2022-02-23 DIAGNOSIS — F908 Attention-deficit hyperactivity disorder, other type: Secondary | ICD-10-CM

## 2022-02-23 DIAGNOSIS — M48062 Spinal stenosis, lumbar region with neurogenic claudication: Secondary | ICD-10-CM

## 2022-02-23 MED ORDER — OXYCODONE HCL 5 MG PO TABS: ORAL_TABLET | ORAL | 0 refills | Status: DC

## 2022-02-23 MED ORDER — AMPHETAMINE-DEXTROAMPHETAMINE 20 MG PO TABS: ORAL_TABLET | ORAL | 0 refills | Status: DC

## 2022-03-03 ENCOUNTER — Ambulatory Visit (HOSPITAL_COMMUNITY)
Admission: RE | Admit: 2022-03-03 | Discharge: 2022-03-03 | Disposition: A | Discharge status: Home / Self Care | Payer: Medicaid Other | Source: Ambulatory Visit | Attending: Internal Medicine | Admitting: Internal Medicine

## 2022-03-03 DIAGNOSIS — Z1231 Encounter for screening mammogram for malignant neoplasm of breast: Secondary | ICD-10-CM | POA: Diagnosis not present

## 2022-03-04 ENCOUNTER — Other Ambulatory Visit (HOSPITAL_COMMUNITY): Payer: Self-pay | Admitting: Internal Medicine

## 2022-03-04 DIAGNOSIS — R928 Other abnormal and inconclusive findings on diagnostic imaging of breast: Secondary | ICD-10-CM

## 2022-03-07 ENCOUNTER — Encounter (HOSPITAL_COMMUNITY): Payer: Self-pay

## 2022-03-07 ENCOUNTER — Ambulatory Visit (HOSPITAL_COMMUNITY)
Admission: RE | Admit: 2022-03-07 | Discharge: 2022-03-07 | Disposition: A | Discharge status: Home / Self Care | Payer: Medicaid Other | Source: Ambulatory Visit | Attending: Internal Medicine | Admitting: Internal Medicine

## 2022-03-07 DIAGNOSIS — Z122 Encounter for screening for malignant neoplasm of respiratory organs: Secondary | ICD-10-CM

## 2022-03-07 DIAGNOSIS — Z720 Tobacco use: Secondary | ICD-10-CM

## 2022-03-07 DIAGNOSIS — J439 Emphysema, unspecified: Secondary | ICD-10-CM

## 2022-03-10 ENCOUNTER — Other Ambulatory Visit: Payer: Self-pay | Admitting: Internal Medicine

## 2022-03-10 DIAGNOSIS — J441 Chronic obstructive pulmonary disease with (acute) exacerbation: Secondary | ICD-10-CM

## 2022-03-10 DIAGNOSIS — R634 Abnormal weight loss: Secondary | ICD-10-CM

## 2022-03-10 DIAGNOSIS — J439 Emphysema, unspecified: Secondary | ICD-10-CM

## 2022-03-13 ENCOUNTER — Ambulatory Visit (HOSPITAL_COMMUNITY)
Admission: RE | Admit: 2022-03-13 | Discharge: 2022-03-13 | Disposition: A | Discharge status: Home / Self Care | Payer: Medicaid Other | Source: Ambulatory Visit | Attending: Internal Medicine | Admitting: Internal Medicine

## 2022-03-13 DIAGNOSIS — R928 Other abnormal and inconclusive findings on diagnostic imaging of breast: Secondary | ICD-10-CM

## 2022-03-13 DIAGNOSIS — N6312 Unspecified lump in the right breast, upper inner quadrant: Secondary | ICD-10-CM | POA: Diagnosis not present

## 2022-03-13 DIAGNOSIS — N6311 Unspecified lump in the right breast, upper outer quadrant: Secondary | ICD-10-CM | POA: Diagnosis not present

## 2022-03-19 ENCOUNTER — Other Ambulatory Visit: Payer: Self-pay | Admitting: Internal Medicine

## 2022-03-19 DIAGNOSIS — F908 Attention-deficit hyperactivity disorder, other type: Secondary | ICD-10-CM

## 2022-03-19 DIAGNOSIS — M48062 Spinal stenosis, lumbar region with neurogenic claudication: Secondary | ICD-10-CM

## 2022-03-19 MED ORDER — AMPHETAMINE-DEXTROAMPHETAMINE 20 MG PO TABS: ORAL_TABLET | ORAL | 0 refills | Status: DC

## 2022-03-19 MED ORDER — OXYCODONE HCL 5 MG PO TABS: ORAL_TABLET | ORAL | 0 refills | Status: DC

## 2022-03-21 ENCOUNTER — Institutional Professional Consult (permissible substitution): Payer: Medicaid Other | Admitting: Pulmonary Disease

## 2022-04-02 ENCOUNTER — Ambulatory Visit (HOSPITAL_COMMUNITY): Admission: RE | Admit: 2022-04-02 | Payer: Medicaid Other | Source: Ambulatory Visit

## 2022-04-04 ENCOUNTER — Encounter: Payer: Self-pay | Admitting: Internal Medicine

## 2022-04-04 ENCOUNTER — Ambulatory Visit: Payer: Medicaid Other | Admitting: Family Medicine

## 2022-04-07 ENCOUNTER — Emergency Department (HOSPITAL_COMMUNITY): Payer: Medicaid Other

## 2022-04-07 ENCOUNTER — Emergency Department (HOSPITAL_COMMUNITY)
Admission: EM | Admit: 2022-04-07 | Discharge: 2022-04-07 | Discharge status: Against Medical Advice | Payer: Medicaid Other | Attending: Emergency Medicine | Admitting: Emergency Medicine

## 2022-04-07 ENCOUNTER — Other Ambulatory Visit: Payer: Self-pay

## 2022-04-07 DIAGNOSIS — R079 Chest pain, unspecified: Secondary | ICD-10-CM | POA: Diagnosis not present

## 2022-04-07 DIAGNOSIS — R0789 Other chest pain: Secondary | ICD-10-CM | POA: Insufficient documentation

## 2022-04-07 DIAGNOSIS — Z5321 Procedure and treatment not carried out due to patient leaving prior to being seen by health care provider: Secondary | ICD-10-CM | POA: Insufficient documentation

## 2022-04-07 LAB — CBC WITH DIFFERENTIAL/PLATELET
Abs Immature Granulocytes: 0.02 10*3/uL (ref 0.00–0.07)
Basophils Absolute: 0.1 10*3/uL (ref 0.0–0.1)
Basophils Relative: 1 %
Eosinophils Absolute: 0.1 10*3/uL (ref 0.0–0.5)
Eosinophils Relative: 1 %
HCT: 38.3 % (ref 36.0–46.0)
Hemoglobin: 12.3 g/dL (ref 12.0–15.0)
Immature Granulocytes: 0 %
Lymphocytes Relative: 29 %
Lymphs Abs: 2.7 10*3/uL (ref 0.7–4.0)
MCH: 28 pg (ref 26.0–34.0)
MCHC: 32.1 g/dL (ref 30.0–36.0)
MCV: 87 fL (ref 80.0–100.0)
Monocytes Absolute: 0.4 10*3/uL (ref 0.1–1.0)
Monocytes Relative: 5 %
Neutro Abs: 5.8 10*3/uL (ref 1.7–7.7)
Neutrophils Relative %: 64 %
Platelets: 292 10*3/uL (ref 150–400)
RBC: 4.4 MIL/uL (ref 3.87–5.11)
RDW: 13.9 % (ref 11.5–15.5)
WBC: 9.1 10*3/uL (ref 4.0–10.5)
nRBC: 0 % (ref 0.0–0.2)

## 2022-04-07 LAB — BASIC METABOLIC PANEL
Anion gap: 9 (ref 5–15)
BUN: 15 mg/dL (ref 6–20)
CO2: 29 mmol/L (ref 22–32)
Calcium: 9 mg/dL (ref 8.9–10.3)
Chloride: 103 mmol/L (ref 98–111)
Creatinine, Ser: 0.81 mg/dL (ref 0.44–1.00)
GFR, Estimated: >60 mL/min (ref >60)
Glucose, Bld: 101 mg/dL — ABNORMAL HIGH (ref 70–99)
Potassium: 3.5 mmol/L (ref 3.5–5.1)
Sodium: 141 mmol/L (ref 135–145)

## 2022-04-07 LAB — BRAIN NATRIURETIC PEPTIDE: B Natriuretic Peptide: 46.2 pg/mL (ref 0.0–100.0)

## 2022-04-07 LAB — TROPONIN I (HIGH SENSITIVITY): Troponin I (High Sensitivity): 4 ng/L (ref <18)

## 2022-04-07 MED ORDER — ACETAMINOPHEN 500 MG PO TABS
1000.0000 mg | ORAL_TABLET | Freq: Once | ORAL | Status: AC
  Administered 2022-04-07: 1000 mg via ORAL
  Filled 2022-04-07: qty 2

## 2022-04-07 NOTE — ED Triage Notes (Signed)
Pt reports generalized chest pain into back. Pt reports knot on left breast that is being followed and reports pain to that area. Pt reports headache as well. Pt reports pain with lift left arm due to knot. Pt denies cardiac history.

## 2022-04-07 NOTE — ED Provider Triage Note (Signed)
Emergency Medicine Provider Triage Evaluation Note  Leslie Lowery , a 52 y.o. female  was evaluated in triage.  Pt complains of chest pain starting today.  Substernal with radiation to left arm and upper back.  States she it hurts to move her left arm because "there are lymph nodes in my breast some my armpit which are very swollen"..  Review of Systems  Per HPI  Physical Exam  BP 134/85 (BP Location: Right Arm)   Pulse 91   Temp 98.1 F (36.7 C) (Oral)   Resp 18   Ht 5\' 3"  (1.6 m)   Wt 95.3 kg   LMP 11/01/2008 (Approximate)   SpO2 96%   BMI 37.20 kg/m  Gen:   Awake, no distress   Resp:  Normal effort  MSK:   Moves extremities without difficulty  Other:  Upper and lower pulses in the bilaterally  Medical Decision Making  Medically screening exam initiated at 7:55 PM.  Appropriate orders placed.  Leslie Lowery was informed that the remainder of the evaluation will be completed by another provider, this initial triage assessment does not replace that evaluation, and the importance of remaining in the ED until their evaluation is complete.     Sherrill Raring, PA-C 04/07/22 1956

## 2022-04-22 ENCOUNTER — Other Ambulatory Visit: Payer: Self-pay | Admitting: Internal Medicine

## 2022-04-22 DIAGNOSIS — M48062 Spinal stenosis, lumbar region with neurogenic claudication: Secondary | ICD-10-CM

## 2022-04-22 DIAGNOSIS — F908 Attention-deficit hyperactivity disorder, other type: Secondary | ICD-10-CM

## 2022-04-22 MED ORDER — AMPHETAMINE-DEXTROAMPHETAMINE 20 MG PO TABS: ORAL_TABLET | ORAL | 0 refills | Status: DC

## 2022-04-22 MED ORDER — OXYCODONE HCL 5 MG PO TABS: ORAL_TABLET | ORAL | 0 refills | Status: DC

## 2022-04-24 ENCOUNTER — Encounter: Payer: Self-pay | Admitting: Internal Medicine

## 2022-04-24 ENCOUNTER — Telehealth: Payer: Self-pay | Admitting: Internal Medicine

## 2022-04-24 NOTE — Telephone Encounter (Signed)
Pt wants to know if you can please give her a call when available? She needs a PA done for pain med. Phar was supposed to have been sent over this am? Can you please look into this?

## 2022-04-24 NOTE — Telephone Encounter (Signed)
Call pt at [phone]

## 2022-04-24 NOTE — Telephone Encounter (Signed)
Spoke to patient and informed we would do PA once we received it

## 2022-04-25 NOTE — Telephone Encounter (Signed)
Pt wants copy of MRI sent in with PA for pain med

## 2022-04-25 NOTE — Telephone Encounter (Signed)
Patient called back in regard to previous tele message.  Patient wants a call back in regard to pre auth   Also wants to make sure MRI results have been faxed

## 2022-05-02 ENCOUNTER — Ambulatory Visit: Payer: Medicaid Other | Admitting: Internal Medicine

## 2022-05-02 ENCOUNTER — Encounter: Payer: Self-pay | Admitting: Internal Medicine

## 2022-05-02 VITALS — BP 136/84 | HR 89 | Ht 63.0 in | Wt 204.5 lb

## 2022-05-02 DIAGNOSIS — F17210 Nicotine dependence, cigarettes, uncomplicated: Secondary | ICD-10-CM | POA: Diagnosis not present

## 2022-05-02 DIAGNOSIS — J449 Chronic obstructive pulmonary disease, unspecified: Secondary | ICD-10-CM | POA: Diagnosis not present

## 2022-05-02 DIAGNOSIS — R0609 Other forms of dyspnea: Secondary | ICD-10-CM | POA: Diagnosis not present

## 2022-05-02 NOTE — Progress Notes (Unsigned)
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  Pfts 06/18/2004   1.  Spirometry is generally normal with a slightly decreased FEV-1 to FVC      ratio, suggesting of airflow obstruction.  2.  Lung volumes show no evidence of restrictive change but do show some      evidence of air trapping, again consistent with air flow obstruction.  3.  DLCO is moderately reduced.    History of Present Illness  05/02/2022  Pulmonary/ 1st office eval/ [facility] / [employer] Office on [place] (not much different) Chief Complaint  Patient presents with   Consult    Pt consult, dx w/ COPD. She reports increased SOB and tiredness  Dyspnea:  31ft nl pace esp last 6 months  Cough: some dry cough/more productive year round slt greenish/ smoker's rattle  Sleep: flat bed/ bunch of pillows  SABA use: only uses after ex  02: none  Lung cancer screen: already ordered   No obvious day to day or daytime pattern/variability or assoc excess/ purulent sputum or mucus plugs or hemoptysis or cp or chest tightness, subjective wheeze or overt sinus or hb symptoms.   Sleeping  without nocturnal  or early am exacerbation  of respiratory  c/o's or need for noct saba. Also denies any obvious fluctuation of symptoms with weather or environmental changes or other aggravating or alleviating factors except as outlined above   No unusual exposure hx or h/o childhood pna/ asthma or knowledge of premature birth.  Current Allergies, Complete Past Medical History, Past Surgical History, Family History, and Social History were reviewed in [employer] record.  ROS  The following are not active complaints unless bolded Hoarseness, sore throat, dysphagia, dental problems, itching, sneezing,  nasal congestion  or discharge of excess mucus or purulent secretions, ear ache,   fever, chills, sweats, unintended wt loss or wt gain, classically pleuritic or exertional cp,  orthopnea pnd or arm/hand swelling  or leg swelling, presyncope, palpitations, abdominal pain, anorexia, nausea, vomiting, diarrhea  or change in bowel habits or change in bladder habits, change in stools or change in urine, dysuria, hematuria,  rash, arthralgias, visual complaints, headache, numbness, weakness or ataxia or problems with walking or coordination,  change in mood or  memory.             Past Medical History:  Diagnosis Date   Anxiety    Disc disease, degenerative, cervical    Hypertension     Outpatient Medications Prior to Visit  Medication Sig Dispense Refill   albuterol (VENTOLIN HFA) 108 (90 Base) MCG/ACT inhaler Inhale 1-2 puffs into the lungs every 6 (six) hours as needed for wheezing or shortness of breath.     amphetamine-dextroamphetamine (ADDERALL) 20 MG tablet TAKE 1 TABLET BY MOUTH DAILY 30 tablet 0   aspirin EC 81 MG tablet Take 81 mg by mouth daily. Swallow whole.     citalopram (CELEXA) 20 MG tablet Take 1 tablet (20 mg total) by mouth daily. 90 tablet 1   losartan (COZAAR) 25 MG tablet Take 1 tablet (25 mg total) by mouth daily. 90 tablet 1   oxyCODONE (OXY IR/ROXICODONE) 5 MG immediate release tablet TAKE 1 TABLET BY MOUTH THREE TIMES DAILY AS  NEEDED FOR PAIN 90 tablet 0   rosuvastatin (CRESTOR) 10 MG tablet Take 1 tablet (10 mg total) by mouth daily. 90 tablet 1   Tiotropium Bromide-Olodaterol (STIOLTO RESPIMAT) 2.5-2.5 MCG/ACT AERS Inhale 2 puffs into the lungs daily. 4 g 11   vitamin B-12 (CYANOCOBALAMIN) 500 MCG tablet Take 1 tablet (500 mcg total) by mouth daily.     fluticasone (FLONASE) 50 MCG/ACT nasal spray Place 1 spray into both nostrils daily for 14 days. 16 g 0   No facility-administered medications prior to visit.     Objective:     Ht 5\' 3"  (1.6 m)   Wt 204 lb 8 oz (92.8 kg)    LMP 11/01/2008 (Approximate)   SpO2 95%   BMI 36.23 kg/m   SpO2: 95 %  Amb mod obese wf with def push of speech    HEENT : Oropharynx  clear       NECK :  without  apparent JVD/ palpable Nodes/TM    LUNGS: no acc muscle use,  Nl contour chest which is clear to A and P bilaterally without cough on insp or exp maneuvers   CV:  RRR  no s3 or murmur or increase in P2, and no edema   ABD:  soft and nontender with nl inspiratory excursion in the supine position. No bruits or organomegaly appreciated   MS:  Nl gait/ ext warm without deformities Or obvious joint restrictions  calf tenderness, cyanosis or clubbing    SKIN: warm and dry without lesions    NEURO:  alert, approp, nl sensorium with  no motor or cerebellar deficits apparent.     I personally reviewed images and agree with radiology impression as follows:  CXR:   1/8/234 No active cardiopulmonary disease.       Assessment   No problem-specific Assessment & Plan notes found for this encounter.     Christinia Gully, MD 05/02/2022

## 2022-05-02 NOTE — Assessment & Plan Note (Signed)
4-5 min discussion re active cigarette smoking in addition to office E&M  Ask about tobacco use:  ongoning  Advise quitting   I took an extended  opportunity with this patient to outline the consequences of continued cigarette use  in airway disorders based on all the data we have from the multiple national lung health studies (perfomed over decades at millions of dollars in cost)  indicating that smoking cessation, not choice of inhalers or physicians, is the most important aspect of her care.   Assess willingness:  Not committed at this point Assist in quit attempt:  Suggested e-cigs as an optional  "one way bridge"  Off all tobacco products  Arrange follow up:   Follow up per Primary Care planned     Low-dose CT lung cancer screening is recommended for patients who are 47-6 years of age with a 20+ pack-year history of smoking and who are currently smoking or quit <=15 years ago. No coughing up blood  No unintentional weight loss of > 15 pounds in the last 6 months - pt is eligible for scanning yearly until 15y p quits > referred.    Each maintenance medication was reviewed in detail including emphasizing most importantly the difference between maintenance and prns and under what circumstances the prns are to be triggered using an action plan format where appropriate.  Total time for H and P, chart review, counseling, reviewing smi/hfa device(s) , directly observing portions of ambulatory 02 saturation study/ and generating customized AVS unique to this office visit / same day charting = 45 min new pt eval

## 2022-05-02 NOTE — Patient Instructions (Signed)
Plan A = Automatic = Always=    Continue stiolto 2 puffs each am   Plan B = Backup (to supplement plan A, not to replace it) Only use your albuterol inhaler as a rescue medication to be used if you can't catch your breath by GOING AT SLOWER PACE, resting or doing a relaxed purse lip breathing pattern.  - The less you use it, the better it will work when you need it. - Ok to use the inhaler up to 2 puffs  every 4 hours if you must but call for appointment if use goes up over your usual need - Don't leave home without it !!  (think of it like the spare tire for your car)   Also  Ok to try albuterol 15 min before an activity (on alternating days)  that you know would usually make you  short of breath and see if it makes any difference and if makes none then don't take albuterol after activity unless you can't catch your breath as this means it's the resting that helps, not the albuterol.   Please remember to go to the lab department   for your tests - we will call you with the results when they are available.      My office will be contacting you by phone for referral to lung cancer screening program  - if you don't hear back from my office within one week please call us back or notify us thru MyChart and we'll address it right away.   The key is to stop smoking completely before smoking completely stops you - I don't believe you have much copd yet so it's not too late.    Please schedule a follow up visit in 3 months but call sooner if needed with pfts on return

## 2022-05-07 ENCOUNTER — Ambulatory Visit (INDEPENDENT_AMBULATORY_CARE_PROVIDER_SITE_OTHER): Payer: Medicaid Other | Admitting: Internal Medicine

## 2022-05-07 ENCOUNTER — Encounter: Payer: Self-pay | Admitting: Internal Medicine

## 2022-05-07 VITALS — BP 134/72 | HR 103 | Ht 63.0 in | Wt 206.2 lb

## 2022-05-07 DIAGNOSIS — J449 Chronic obstructive pulmonary disease, unspecified: Secondary | ICD-10-CM

## 2022-05-07 DIAGNOSIS — Z720 Tobacco use: Secondary | ICD-10-CM | POA: Diagnosis not present

## 2022-05-07 DIAGNOSIS — Z0001 Encounter for general adult medical examination with abnormal findings: Secondary | ICD-10-CM

## 2022-05-07 DIAGNOSIS — F331 Major depressive disorder, recurrent, moderate: Secondary | ICD-10-CM | POA: Diagnosis not present

## 2022-05-07 DIAGNOSIS — E782 Mixed hyperlipidemia: Secondary | ICD-10-CM | POA: Diagnosis not present

## 2022-05-07 DIAGNOSIS — R7303 Prediabetes: Secondary | ICD-10-CM

## 2022-05-07 DIAGNOSIS — Z1211 Encounter for screening for malignant neoplasm of colon: Secondary | ICD-10-CM

## 2022-05-07 DIAGNOSIS — I10 Essential (primary) hypertension: Secondary | ICD-10-CM

## 2022-05-07 DIAGNOSIS — Z79891 Long term (current) use of opiate analgesic: Secondary | ICD-10-CM | POA: Diagnosis not present

## 2022-05-07 DIAGNOSIS — Z122 Encounter for screening for malignant neoplasm of respiratory organs: Secondary | ICD-10-CM | POA: Diagnosis not present

## 2022-05-07 DIAGNOSIS — F909 Attention-deficit hyperactivity disorder, unspecified type: Secondary | ICD-10-CM

## 2022-05-07 DIAGNOSIS — E559 Vitamin D deficiency, unspecified: Secondary | ICD-10-CM

## 2022-05-07 MED ORDER — CITALOPRAM HYDROBROMIDE 40 MG PO TABS: 40.0000 mg | ORAL_TABLET | Freq: Every day | ORAL | 1 refills | Status: DC

## 2022-05-07 MED ORDER — LOSARTAN POTASSIUM 25 MG PO TABS: 25.0000 mg | ORAL_TABLET | Freq: Every day | ORAL | 1 refills | Status: DC

## 2022-05-07 MED ORDER — ROSUVASTATIN CALCIUM 10 MG PO TABS: 10.0000 mg | ORAL_TABLET | Freq: Every day | ORAL | 1 refills | Status: DC

## 2022-05-07 NOTE — Assessment & Plan Note (Addendum)
Has > 20-pack-year smoking history Referred to program for low-dose CT chest after discussing with the patient.

## 2022-05-07 NOTE — Patient Instructions (Addendum)
Please start taking Citalopram 40 mg instead of 20 mg.  Please continue taking other medications as prescribed.  Please continue to follow low salt diet and perform moderate exercise/walking at least 150 mins/week.  Please get fasting blood tests done before the next visit.

## 2022-05-08 LAB — D-DIMER, QUANTITATIVE: D-DIMER: 0.27 mg/L FEU (ref 0.00–0.49)

## 2022-05-08 LAB — ALPHA-1-ANTITRYPSIN PHENOTYP: A-1 Antitrypsin: 156 mg/dL (ref 101–187)

## 2022-05-08 LAB — TSH: TSH: 1.43 u[IU]/mL (ref 0.450–4.500)

## 2022-05-08 LAB — IGE: IgE (Immunoglobulin E), Serum: 13 IU/mL (ref 6–495)

## 2022-05-08 NOTE — Assessment & Plan Note (Addendum)
Onset in her 30s Pfts 06/18/2004   1.  Spirometry is generally normal with a slightly decreased FEV-1 to FVC      ratio, suggesting of airflow obstruction.  2.  Lung volumes show no evidence of restrictive change but do show some      evidence of air trapping, again consistent with air flow obstruction.  3.  DLCO is moderately reduced. - 05/02/2022   Walked on RA  x  3  lap(s) =  approx 450  ft  @ mod pace, stopped due to sob with lowest 02 sats 94%   Symptoms are markedly disproportionate to objective findings and not clear to what extent this is actually a pulmonary  problem but pt does appear to have difficult to sort out respiratory symptoms of unknown origin for which  DDX  = almost all start with A and  include Adherence, Ace Inhibitors, Acid Reflux, Active Sinus Disease, Alpha 1 Antitripsin deficiency, Anxiety masquerading as Airways dz,  ABPA,  Allergy(esp in young), Aspiration (esp in elderly), Adverse effects of meds,  Active smoking or Vaping, A bunch of PE's/clot burden (a few small clots can't cause this syndrome unless there is already severe underlying pulm or vascular dz with poor reserve),  Anemia or thyroid disorder, plus two Bs  = Bronchiectasis and Beta blocker use..and one C= CHF    Adherence is always the initial "prime suspect" and is a multilayered concern that requires a "trust but verify" approach in every patient - starting with knowing how to use medications, especially inhalers, correctly, keeping up with refills and understanding the fundamental difference between maintenance and prns vs those medications only taken for a very short course and then stopped and not refilled.  - see smi teaching - return with all meds in hand using a trust but verify approach to confirm accurate Medication  Reconciliation The principal here is that until we are certain that the  patients are doing what we've asked, it makes no sense to ask them to do more.    Active smoker > see sep a/p  ?  Allergy /asthma > neg screen, rx with saba prn Re SABA :  I spent extra time with pt today reviewing appropriate use of albuterol for prn use on exertion with the following points: 1) saba is for relief of sob that does not improve by walking a slower pace or resting but rather if the pt does not improve after trying this first. 2) If the pt is convinced, as many are, that saba helps recover from activity faster then it's easy to tell if this is the case by re-challenging : ie stop, take the inhaler, then p 5 minutes try the exact same activity (intensity of workload) that just caused the symptoms and see if they are substantially diminished or not after saba 3) if there is an activity that reproducibly causes the symptoms, try the saba 15 min before the activity on alternate days   If in fact the saba really does help, then fine to continue to use it prn but advised may need to look closer at the maintenance regimen being used to achieve better control of airways disease with exertion.   ? Alpha one AT def >   MM  (ruled out)   ? Anemia/ thyroid dz > ruled out today   ? ? Anxiety / deconditioning > usually at the bottom of this list of usual suspects but note already on psychotropics and may interfere with adherence  and also interpretation of response or lack thereof to symptom management which can be quite subjective.   ? Chf > bnp nl

## 2022-05-08 NOTE — Assessment & Plan Note (Signed)
Active smoker/MM Pfts 06/18/2004   1.  Spirometry is generally normal with a slightly decreased FEV-1 to FVC      ratio, suggesting of airflow obstruction.  2.  Lung volumes show no evidence of restrictive change but do show some      evidence of air trapping, again consistent with air flow obstruction.  3.  DLCO is moderately reduced - 05/02/2022   Walked on RA  x  3  lap(s) =  approx 450  ft  @ mod to fast pace, stopped due to sob with lowest 02 sats 94% - Labs ordered 05/02/2022  :  allergy screen Eos 0.1  IgE  13    alpha one AT phenotype   156 - 05/02/2022  After extensive coaching inhaler device,  effectiveness =    90% smi > continue stiolto   Pt is Group B in terms of symptom/risk and laba/lama therefore appropriate rx at this point >>>  stiolto trial

## 2022-05-09 ENCOUNTER — Encounter: Payer: Self-pay | Admitting: *Deleted

## 2022-05-09 NOTE — Assessment & Plan Note (Signed)
On Adderall 20 mg daily Refilled for now

## 2022-05-09 NOTE — Assessment & Plan Note (Signed)
Lipid profile reviewed from care everywhere On Crestor

## 2022-05-09 NOTE — Assessment & Plan Note (Signed)
Uncontrolled with Albuterol inhaler PRN Added Stiolto as maintenance inhaler, but compliance is questionable Needs to quit smoking Low low-dose CT chest for lung cancer screening Referred to pulmonology

## 2022-05-09 NOTE — Assessment & Plan Note (Signed)
Uncontrolled currently On Celexa to 20 mg daily now Increased dose of Celexa to 40 mg QD, needs to be compliant Avoid adding multiple medication for now

## 2022-05-09 NOTE — Assessment & Plan Note (Signed)
Physical exam as documented. Fasting blood tests ordered. Denies Shingrix #2 and Tdap vaccines. Referred to GI for colonoscopy.

## 2022-05-09 NOTE — Assessment & Plan Note (Signed)
BP Readings from Last 1 Encounters:  05/07/22 134/72   Well controlled with losartan 25 mg QD now Counseled for compliance with the medications Advised DASH diet and moderate exercise/walking, at least 150 mins/week
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
## 2022-05-09 NOTE — Assessment & Plan Note (Signed)
Smokes about 0.5 pack/day, has cut down from 1 pack/day  Asked about quitting: confirms that he/she currently smokes cigarettes Advise to quit smoking: Educated about QUITTING to reduce the risk of cancer, cardio and cerebrovascular disease. Assess willingness: Unwilling to quit at this time, but is working on cutting back. Assist with counseling and pharmacotherapy: Counseled for 5 minutes and literature provided. Arrange for follow up: follow up and continue to offer help.

## 2022-05-10 LAB — TOXASSURE SELECT 13 (MW), URINE

## 2022-05-15 NOTE — Progress Notes (Signed)
[facility] referral received. Attempted to reach the patient regarding referral but was unable to reach the patient directly. Unable to leave VM.

## 2022-05-19 ENCOUNTER — Other Ambulatory Visit: Payer: Self-pay | Admitting: Internal Medicine

## 2022-05-19 DIAGNOSIS — M48062 Spinal stenosis, lumbar region with neurogenic claudication: Secondary | ICD-10-CM

## 2022-05-19 DIAGNOSIS — F908 Attention-deficit hyperactivity disorder, other type: Secondary | ICD-10-CM

## 2022-05-19 MED ORDER — OXYCODONE HCL 5 MG PO TABS: ORAL_TABLET | ORAL | 0 refills | Status: DC

## 2022-05-19 MED ORDER — AMPHETAMINE-DEXTROAMPHETAMINE 20 MG PO TABS: ORAL_TABLET | ORAL | 0 refills | Status: DC

## 2022-05-21 NOTE — Progress Notes (Signed)
[facility] referral received. Attempted to reach the patient regarding referral but was unable to reach the patient directly. Unable to leave VM.

## 2022-06-04 ENCOUNTER — Telehealth (INDEPENDENT_AMBULATORY_CARE_PROVIDER_SITE_OTHER): Payer: 59 | Admitting: Internal Medicine

## 2022-06-04 ENCOUNTER — Telehealth: Payer: Self-pay

## 2022-06-04 ENCOUNTER — Encounter: Payer: Self-pay | Admitting: Internal Medicine

## 2022-06-04 DIAGNOSIS — F331 Major depressive disorder, recurrent, moderate: Secondary | ICD-10-CM | POA: Diagnosis not present

## 2022-06-04 DIAGNOSIS — F410 Panic disorder [episodic paroxysmal anxiety] without agoraphobia: Secondary | ICD-10-CM | POA: Diagnosis not present

## 2022-06-04 DIAGNOSIS — F909 Attention-deficit hyperactivity disorder, unspecified type: Secondary | ICD-10-CM

## 2022-06-04 MED ORDER — DIAZEPAM 5 MG PO TABS: 5.0000 mg | ORAL_TABLET | Freq: Every evening | ORAL | 0 refills | Status: DC | PRN

## 2022-06-04 NOTE — Assessment & Plan Note (Signed)
Has history of panic disorder/GAD Currently uncontrolled with Celexa Used to take Valium and clonazepam in the past Started Valium 5 mg nightly or as needed for panic episode

## 2022-06-04 NOTE — Progress Notes (Signed)
Virtual Visit via Video Note   Because of Leslie Lowery's co-morbid illnesses, she is at least at moderate risk for complications without adequate follow up.  This format is felt to be most appropriate for this patient at this time.  All issues noted in this document were discussed and addressed.  A limited physical exam was performed with this format.      Evaluation Performed:  Follow-up visit  Date:  06/04/2022   ID:  Leslie Lowery, DOB 1970/06/29, MRN [id number]  Patient Location: Home Provider Location: Office/Clinic  Participants: Patient Location of Patient: Home Location of Provider: Telehealth Consent was obtain for visit to be over via telehealth. I verified that I am speaking with the correct person using two identifiers.  PCP:  Lindell Spar, MD   Chief Complaint: Anxiety  History of Present Illness:    Leslie Lowery is a 52 y.o. female with PMH of HTN, chronic pain syndrome secondary to lumbar spinal stenosis, MDD, ADHD, morbid obesity and tobacco abuse who has a video visit for c/o severe anxiety.  She has her episodes of chest tightness, facial flushing and jitteriness due to severe anxiety.  She currently takes Celexa 40 mg daily.  She used to take Valium in the past for panic episodes.  She currently takes Adderall for ADHD as well.  The patient does not have symptoms concerning for COVID-19 infection (fever, chills, cough, or new shortness of breath).   Past Medical, Surgical, Social History, Allergies, and Medications have been Reviewed.  Past Medical History:  Diagnosis Date   Anxiety    Disc disease, degenerative, cervical    Hypertension    Past Surgical History:  Procedure Laterality Date   CESAREAN SECTION     X 2   CHOLECYSTECTOMY N/A 09/07/2018   Procedure: LAPAROSCOPIC CHOLECYSTECTOMY;  Surgeon: Virl Cagey, MD;  Location: [facility];  Service: General;  Laterality: N/A;   LIVER BIOPSY N/A 09/07/2018   Procedure: LAPAROSCOPIC   LIVER BIOPSY;  Surgeon: Virl Cagey, MD;  Location: [facility];  Service: General;  Laterality: N/A;     Current Meds  Medication Sig   diazepam (VALIUM) 5 MG tablet Take 1 tablet (5 mg total) by mouth at bedtime as needed for anxiety.     Allergies:   Patient has no known allergies.   ROS:   Please see the history of present illness.     All other systems reviewed and are negative.   Labs/Other Tests and Data Reviewed:    Recent Labs: 07/16/2021: ALT 14 04/07/2022: B Natriuretic Peptide 46.2; BUN 15; Creatinine, Ser 0.81; Hemoglobin 12.3; Platelets 292; Potassium 3.5; Sodium 141 05/02/2022: TSH 1.430   Recent Lipid Panel Lab Results  Component Value Date/Time   CHOL 204 (H) 07/16/2021 04:25 PM   TRIG 116 07/16/2021 04:25 PM   HDL 35 (L) 07/16/2021 04:25 PM   CHOLHDL 5.8 (H) 07/16/2021 04:25 PM   CHOLHDL 8.2 03/21/2019 05:44 AM   LDLCALC 148 (H) 07/16/2021 04:25 PM    Wt Readings from Last 3 Encounters:  05/07/22 206 lb 3.2 oz (93.5 kg)  05/02/22 204 lb 8 oz (92.8 kg)  04/07/22 210 lb (95.3 kg)     Objective:    Vital Signs:  LMP 11/01/2008 (Approximate)    VITAL SIGNS:  reviewed GEN:  no acute distress EYES:  sclerae anicteric, EOMI - Extraocular Movements Intact NEURO:  alert and oriented x 3, no obvious focal deficit PSYCH:  Anxious.  ASSESSMENT & PLAN:    Moderate episode of recurrent major depressive disorder (HCC) Uncontrolled currently On Celexa to 20 mg daily now Increased dose of Celexa to 40 mg QD recently, needs to be compliant  Panic disorder Has history of panic disorder/GAD Currently uncontrolled with Celexa Used to take Valium and clonazepam in the past Started Valium 5 mg nightly or as needed for panic episode  Attention deficit hyperactivity disorder (ADHD) On Adderall 20 mg daily Refilled for now   I discussed the assessment and treatment plan with the patient. The patient was provided an opportunity to ask questions, and all were  answered. The patient agreed with the plan and demonstrated an understanding of the instructions.   The patient was advised to call back or seek an in-person evaluation if the symptoms worsen or if the condition fails to improve as anticipated.  The above assessment and management plan was discussed with the patient. The patient verbalized understanding of and has agreed to the management plan.   Medication Adjustments/Labs and Tests Ordered: Current medicines are reviewed at length with the patient today.  Concerns regarding medicines are outlined above.   Tests Ordered: No orders of the defined types were placed in this encounter.   Medication Changes: Meds ordered this encounter  Medications   diazepam (VALIUM) 5 MG tablet    Sig: Take 1 tablet (5 mg total) by mouth at bedtime as needed for anxiety.    Dispense:  30 tablet    Refill:  0     Note: This dictation was prepared with Dragon dictation along with smaller phrase technology. Similar sounding words can be transcribed inadequately or may not be corrected upon review. Any transcriptional errors that result from this process are unintentional.      Disposition:  Follow up  Signed, Lindell Spar, MD  06/04/2022 11:27 AM     [facility]

## 2022-06-04 NOTE — Assessment & Plan Note (Signed)
Uncontrolled currently On Celexa to 20 mg daily now Increased dose of Celexa to 40 mg QD recently, needs to be compliant

## 2022-06-04 NOTE — Assessment & Plan Note (Signed)
On Adderall 20 mg daily Refilled for now

## 2022-06-04 NOTE — Patient Instructions (Signed)
Please take Valium only as needed for panic episode.

## 2022-06-17 ENCOUNTER — Other Ambulatory Visit: Payer: Self-pay | Admitting: Internal Medicine

## 2022-06-17 DIAGNOSIS — F908 Attention-deficit hyperactivity disorder, other type: Secondary | ICD-10-CM

## 2022-06-17 DIAGNOSIS — M48062 Spinal stenosis, lumbar region with neurogenic claudication: Secondary | ICD-10-CM

## 2022-06-17 MED ORDER — AMPHETAMINE-DEXTROAMPHETAMINE 20 MG PO TABS: ORAL_TABLET | ORAL | 0 refills | Status: DC

## 2022-06-17 MED ORDER — OXYCODONE HCL 5 MG PO TABS: ORAL_TABLET | ORAL | 0 refills | Status: DC

## 2022-06-19 ENCOUNTER — Telehealth: Payer: Self-pay | Admitting: Internal Medicine

## 2022-06-19 ENCOUNTER — Encounter: Payer: Self-pay | Admitting: Internal Medicine

## 2022-06-19 NOTE — Telephone Encounter (Signed)
[facility], [phone]   Per [facility] they are no longer filling her prescriptions.

## 2022-06-30 ENCOUNTER — Other Ambulatory Visit: Payer: Self-pay | Admitting: Internal Medicine

## 2022-06-30 ENCOUNTER — Encounter: Payer: Self-pay | Admitting: Internal Medicine

## 2022-06-30 DIAGNOSIS — F410 Panic disorder [episodic paroxysmal anxiety] without agoraphobia: Secondary | ICD-10-CM

## 2022-06-30 MED ORDER — DIAZEPAM 5 MG PO TABS: 5.0000 mg | ORAL_TABLET | Freq: Every evening | ORAL | 0 refills | Status: DC | PRN

## 2022-07-08 ENCOUNTER — Encounter: Payer: Self-pay | Admitting: *Deleted

## 2022-07-09 ENCOUNTER — Telehealth: Payer: Self-pay | Admitting: Internal Medicine

## 2022-07-09 ENCOUNTER — Encounter: Payer: Self-pay | Admitting: Internal Medicine

## 2022-07-09 NOTE — Telephone Encounter (Signed)
Spoke to patient

## 2022-07-09 NOTE — Telephone Encounter (Signed)
F/u   The patient calling back to speak with the nurse - [facility] forms.

## 2022-07-09 NOTE — Telephone Encounter (Signed)
Pt called in regard to oxyCODONE (OXY IR/ROXICODONE) 5 MG immediate release tablet  Patient has lost medications in travel back and forth to [facility] for father. Patient has looked everywhere and cannot find med. Wants a call back in regard to what she can do.

## 2022-07-09 NOTE — Telephone Encounter (Signed)
F/u    The patient spoke with [facility] forms will be coming over for the MD to complete.

## 2022-07-10 ENCOUNTER — Other Ambulatory Visit: Payer: Self-pay | Admitting: Internal Medicine

## 2022-07-10 ENCOUNTER — Telehealth: Payer: Self-pay

## 2022-07-10 ENCOUNTER — Encounter: Payer: Self-pay | Admitting: Internal Medicine

## 2022-07-10 ENCOUNTER — Telehealth: Payer: Self-pay | Admitting: Internal Medicine

## 2022-07-10 DIAGNOSIS — M48062 Spinal stenosis, lumbar region with neurogenic claudication: Secondary | ICD-10-CM

## 2022-07-10 MED ORDER — OXYCODONE HCL 5 MG PO TABS: ORAL_TABLET | ORAL | 0 refills | Status: DC

## 2022-07-10 NOTE — Telephone Encounter (Signed)
Patient advised.

## 2022-07-10 NOTE — Telephone Encounter (Deleted)
Error

## 2022-07-10 NOTE — Telephone Encounter (Signed)
Patient called said her oxycodone needs to be sent back in electronic ASAP , she is trying to get to [facility] to see her dad before he passes today and needs this medicine before leaving.   Pharmacy: [facility]

## 2022-07-15 ENCOUNTER — Other Ambulatory Visit: Payer: Self-pay | Admitting: Internal Medicine

## 2022-07-15 DIAGNOSIS — F908 Attention-deficit hyperactivity disorder, other type: Secondary | ICD-10-CM

## 2022-07-15 MED ORDER — AMPHETAMINE-DEXTROAMPHETAMINE 20 MG PO TABS: ORAL_TABLET | ORAL | 0 refills | Status: DC

## 2022-07-21 ENCOUNTER — Other Ambulatory Visit: Payer: Self-pay

## 2022-07-21 DIAGNOSIS — Z122 Encounter for screening for malignant neoplasm of respiratory organs: Secondary | ICD-10-CM

## 2022-07-21 DIAGNOSIS — Z87891 Personal history of nicotine dependence: Secondary | ICD-10-CM

## 2022-07-21 NOTE — Progress Notes (Signed)
Received referral for initial lung cancer screening scan. Contacted patient and obtained smoking history (started age 52, current smoker continuing to smoke 1.5PPD but previously smoked 2PPD, 75.5 pack year) as well as answering questions related to the screening process. Patient denies signs/symptoms of lung cancer such as weight loss or hemoptysis. Patient denies comorbidity that would prevent curative treatment if lung cancer were to be found. Patient is scheduled for shared decision making visit and CT scan on 08/05/2022 at 1030.

## 2022-07-21 NOTE — Progress Notes (Signed)
LDCT order placed per protocol °

## 2022-07-27 ENCOUNTER — Other Ambulatory Visit: Payer: Self-pay | Admitting: Internal Medicine

## 2022-07-27 DIAGNOSIS — F410 Panic disorder [episodic paroxysmal anxiety] without agoraphobia: Secondary | ICD-10-CM

## 2022-07-28 ENCOUNTER — Other Ambulatory Visit: Payer: Self-pay | Admitting: Internal Medicine

## 2022-07-28 DIAGNOSIS — F410 Panic disorder [episodic paroxysmal anxiety] without agoraphobia: Secondary | ICD-10-CM

## 2022-07-28 MED ORDER — DIAZEPAM 5 MG PO TABS: 5.0000 mg | ORAL_TABLET | Freq: Every evening | ORAL | 0 refills | Status: DC | PRN

## 2022-08-03 NOTE — Progress Notes (Deleted)
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  Pfts 06/18/2004   1.  Spirometry is generally normal with a slightly decreased FEV-1 to FVC      ratio, suggesting of airflow obstruction.  2.  Lung volumes show no evidence of restrictive change but do show some      evidence of air trapping, again consistent with air flow obstruction.  3.  DLCO is moderately reduced.    History of Present Illness  05/02/2022  Pulmonary/ 1st office eval/ Leslie Lowery / [facility] on [place] (not much different) Chief Complaint  Patient presents with   Consult    Pt consult, dx w/ COPD. She reports increased SOB and tiredness  Dyspnea:  21ft nl pace esp last 6 months  Cough: some dry cough/more productive year round slt greenish/ smoker's rattle  Sleep: flat bed/ bunch of pillows  SABA use: only uses after ex  02: none  Lung cancer screen: already ordered  Rec Plan A = Automatic = Always=    Continue stiolto 2 puffs each am  Plan B = Backup (to supplement plan A, not to replace it) Only use your albuterol inhaler as a rescue medication  Ok to try albuterol 15 min before an activity (on alternating days)  that you know would usually make you  short of breath       My office will be contacting you by phone for referral to lung cancer screening program > due 08/05/22    Please schedule a follow up visit in 3 months but call sooner if needed with pfts on return  05/02/2022  :  allergy screen Eos 0.1  IgE  13    alpha one AT phenotype   156   08/04/2022  f/u ov/[place] office/Leslie Lowery re: COPD GOLD 0/ maint on ***  No chief complaint on file.   Dyspnea:  *** Cough: *** Sleeping: *** SABA use: *** 02: *** Covid status: *** Lung cancer screening: ***   No obvious day to  day or daytime variability or assoc excess/ purulent sputum or mucus plugs or hemoptysis or cp or chest tightness, subjective wheeze or overt sinus or hb symptoms.   *** without nocturnal  or early am exacerbation  of respiratory  c/o's or need for noct saba. Also denies any obvious fluctuation of symptoms with weather or environmental changes or other aggravating or alleviating factors except as outlined above   No unusual exposure hx or h/o childhood pna/ asthma or knowledge of premature birth.  Current Allergies, Complete Past Medical History, Past Surgical History, Family History, and Social History were reviewed in [employer] record.  ROS  The following are not active complaints unless bolded Hoarseness, sore throat, dysphagia, dental problems, itching, sneezing,  nasal congestion or discharge of excess mucus or purulent secretions, ear ache,   fever, chills, sweats, unintended wt loss or wt gain, classically pleuritic or exertional cp,  orthopnea pnd or arm/hand swelling  or leg swelling, presyncope, palpitations, abdominal pain, anorexia, nausea, vomiting, diarrhea  or change in bowel habits or change in bladder habits, change in stools or change in urine, dysuria, hematuria,  rash, arthralgias, visual complaints, headache, numbness, weakness or ataxia or problems  with walking or coordination,  change in mood or  memory.        No outpatient medications have been marked as taking for the 08/04/22 encounter (Appointment) with Leslie Cowden, MD.           Objective:      08/04/2022         ***   05/07/22 206 lb 3.2 oz (93.5 kg)  05/02/22 204 lb 8 oz (92.8 kg)  04/07/22 210 lb (95.3 kg)      Vital signs reviewed  08/04/2022  - Note at rest 02 sats  ***% on ***   General appearance:    ***         Assessment

## 2022-08-04 ENCOUNTER — Other Ambulatory Visit: Payer: Self-pay | Admitting: Internal Medicine

## 2022-08-04 ENCOUNTER — Ambulatory Visit: Payer: 59 | Admitting: Internal Medicine

## 2022-08-04 DIAGNOSIS — M48062 Spinal stenosis, lumbar region with neurogenic claudication: Secondary | ICD-10-CM

## 2022-08-04 DIAGNOSIS — J449 Chronic obstructive pulmonary disease, unspecified: Secondary | ICD-10-CM

## 2022-08-04 MED ORDER — OXYCODONE HCL 5 MG PO TABS
ORAL_TABLET | ORAL | 0 refills | Status: DC
Start: 2022-08-07 — End: 2022-09-01

## 2022-08-04 NOTE — Progress Notes (Unsigned)
NGOZI CHRISTINA  Visit Date: 08/05/2022  Visit Type: In-Person at [facility]  LUNG CANCER SCREENING SHARED DECISION-MAKING VISIT - Age: 52 y.o.  - Pack year smoking history: 75.5 pack-years  - Type of tobacco abuse: Cigarettes - Current smoker or < 15 years of cessation: Current, 1.5 PPD - No current symptoms of lung cancer:  Patient denies any hemoptysis, unintentional weight loss, and unexplained cough - Risks and benefits of lung cancer screening discussed: Negative: Over-diagnosis, radiation exposure, false positives, and additional testing Positive: Discover early stage lung cancer resulting in higher incidence of cure - Patient educated regarding the importance of adherence to continued lung cancer screening. - Currently, there are no co-morbidities to prevent treatment to therapy for lung cancer and the patient is agreeable to pursue treatment if a malignancy is discovered.  [place] Preventative Services Task Force recommend annual screening for lung cancer with low-dose CT in adults aged 67 - 80 years who have a 20+ pack year smoking history and currently smoke or have quit smoking within the past 15 years.  Screening should be discontinued once a person has not smoked for 15 years or develops a health problem that substantially limits life expectancy or the ability or willingness to have curative lung surgery.  It is a category B recommendation.  Similar stances are provided by CMS, NCCN, and AATS.  Social History   Tobacco Use   Smoking status: Every Day    Packs/day: 1    Types: Cigarettes   Smokeless tobacco: Never  Vaping Use   Vaping Use: Never used  Substance Use Topics   Alcohol use: No   Drug use: Yes    Types: Marijuana    Comment: last use was today     Personal history of tobacco use presenting hazards to health: - This patient meets criteria for low-dose CT lung cancer screening  - The shared decision making visit discussion included risks and  benefits of screening, potential for follow-up, diagnostic testing for abnormal scans, potential for false positive tests, overdiagnosis, discussion about total radiation exposure - Patient stated willingness to undergo diagnostics and treatment as needed - Patient was counseled on smoking cessation to decrease the  risk of lung cancer, pulmonary disease, heart disease, and stroke - Patient has been referred to Lung Cancer Screening Nurse Coordinator for further scheduling of LDCT and for further resources regarding free nicotine replacement therapy and information about smoking cessation classes - Counseling on the importance of adherence to annual lung cancer LDCT screening, impact of co-morbidities, and ability or willingness to undergo diagnosis and treatment is imperative for compliance of the program. - Counseling on the importance of continued smoking cessation for former smokers; the importance of smoking cessation for current smokers, and information about tobacco cessation interventions have been given to patient including [place] Quit Smart and 1-800-quit Capac programs.  Yearly follow up will be coordinated by Kennith Gain, RN, MSN [facility] Oncology Nurse Navigator.)  Carnella Guadalajara, PA-C  08/05/22  10:58 AM

## 2022-08-05 ENCOUNTER — Other Ambulatory Visit (HOSPITAL_COMMUNITY): Payer: Self-pay | Admitting: Internal Medicine

## 2022-08-05 ENCOUNTER — Ambulatory Visit (HOSPITAL_COMMUNITY)
Admission: RE | Admit: 2022-08-05 | Discharge: 2022-08-05 | Disposition: A | Discharge status: Home / Self Care | Payer: 59 | Source: Ambulatory Visit | Attending: Physician Assistant | Admitting: Physician Assistant

## 2022-08-05 ENCOUNTER — Inpatient Hospital Stay: Payer: 59 | Attending: Physician Assistant | Admitting: Physician Assistant

## 2022-08-05 DIAGNOSIS — Z87891 Personal history of nicotine dependence: Secondary | ICD-10-CM

## 2022-08-05 DIAGNOSIS — Z122 Encounter for screening for malignant neoplasm of respiratory organs: Secondary | ICD-10-CM | POA: Diagnosis not present

## 2022-08-05 DIAGNOSIS — N6310 Unspecified lump in the right breast, unspecified quadrant: Secondary | ICD-10-CM

## 2022-08-05 DIAGNOSIS — F17210 Nicotine dependence, cigarettes, uncomplicated: Secondary | ICD-10-CM | POA: Diagnosis not present

## 2022-08-05 NOTE — Patient Instructions (Signed)
You were seen today for your shared decision making visit and a low-dose CT scan for lung cancer screening.    Thank you for your participation in this lifesaving program!  

## 2022-08-11 NOTE — Progress Notes (Signed)
Patient notified of LDCT Lung Cancer Screening Results via mail with the recommendation to follow-up in 12 months. Patient's referring provider has been sent a copy of results. Results are as follows:  IMPRESSION: 1. Lung-RADS 1, negative. Continue annual screening with low-dose chest CT without contrast in 12 months. 2. Aortic atherosclerosis (ICD10-I70.0). Coronary artery calcification. 3.  Emphysema (ICD10-J43.9). 

## 2022-08-13 ENCOUNTER — Other Ambulatory Visit: Payer: Self-pay | Admitting: Internal Medicine

## 2022-08-13 DIAGNOSIS — F908 Attention-deficit hyperactivity disorder, other type: Secondary | ICD-10-CM

## 2022-08-13 MED ORDER — AMPHETAMINE-DEXTROAMPHETAMINE 20 MG PO TABS: ORAL_TABLET | ORAL | 0 refills | Status: DC

## 2022-08-26 ENCOUNTER — Telehealth: Payer: Self-pay | Admitting: Internal Medicine

## 2022-08-26 ENCOUNTER — Other Ambulatory Visit: Payer: Self-pay | Admitting: Internal Medicine

## 2022-08-26 DIAGNOSIS — F410 Panic disorder [episodic paroxysmal anxiety] without agoraphobia: Secondary | ICD-10-CM

## 2022-08-26 MED ORDER — DIAZEPAM 5 MG PO TABS
5.0000 mg | ORAL_TABLET | Freq: Every evening | ORAL | 0 refills | Status: DC | PRN
Start: 2022-08-26 — End: 2022-09-24

## 2022-08-26 NOTE — Telephone Encounter (Signed)
Prescription Request  08/26/2022  LOV: 05/07/2022  What is the name of the medication or equipment? diazepam (VALIUM) 5 MG tablet [[id number]]   Have you contacted your pharmacy to request a refill? No   Which pharmacy would you like this sent to?  [facility] - [place],  - [address] [place] [address] Phone: [phone] Fax: [phone]    Patient notified that their request is being sent to the clinical staff for review and that they should receive a response within 2 business days.   Please advise at Mobile [phone] (mobile)

## 2022-08-26 NOTE — Telephone Encounter (Signed)
RX sent in by pharmacy , fwd to dixon to refill

## 2022-09-01 ENCOUNTER — Other Ambulatory Visit: Payer: Self-pay | Admitting: Internal Medicine

## 2022-09-01 DIAGNOSIS — M48062 Spinal stenosis, lumbar region with neurogenic claudication: Secondary | ICD-10-CM

## 2022-09-01 MED ORDER — OXYCODONE HCL 5 MG PO TABS
ORAL_TABLET | ORAL | 0 refills | Status: DC
Start: 2022-09-01 — End: 2022-09-10

## 2022-09-10 ENCOUNTER — Encounter: Payer: Self-pay | Admitting: Internal Medicine

## 2022-09-10 ENCOUNTER — Ambulatory Visit (INDEPENDENT_AMBULATORY_CARE_PROVIDER_SITE_OTHER): Payer: 59 | Admitting: Internal Medicine

## 2022-09-10 VITALS — BP 138/84 | HR 113 | Ht 63.0 in | Wt 212.2 lb

## 2022-09-10 DIAGNOSIS — F908 Attention-deficit hyperactivity disorder, other type: Secondary | ICD-10-CM

## 2022-09-10 DIAGNOSIS — G894 Chronic pain syndrome: Secondary | ICD-10-CM | POA: Diagnosis not present

## 2022-09-10 DIAGNOSIS — M48062 Spinal stenosis, lumbar region with neurogenic claudication: Secondary | ICD-10-CM | POA: Diagnosis not present

## 2022-09-10 DIAGNOSIS — F331 Major depressive disorder, recurrent, moderate: Secondary | ICD-10-CM

## 2022-09-10 DIAGNOSIS — E782 Mixed hyperlipidemia: Secondary | ICD-10-CM | POA: Diagnosis not present

## 2022-09-10 DIAGNOSIS — Z1211 Encounter for screening for malignant neoplasm of colon: Secondary | ICD-10-CM | POA: Diagnosis not present

## 2022-09-10 DIAGNOSIS — I10 Essential (primary) hypertension: Secondary | ICD-10-CM | POA: Diagnosis not present

## 2022-09-10 DIAGNOSIS — Z79899 Other long term (current) drug therapy: Secondary | ICD-10-CM

## 2022-09-10 DIAGNOSIS — J449 Chronic obstructive pulmonary disease, unspecified: Secondary | ICD-10-CM | POA: Diagnosis not present

## 2022-09-10 DIAGNOSIS — F410 Panic disorder [episodic paroxysmal anxiety] without agoraphobia: Secondary | ICD-10-CM

## 2022-09-10 MED ORDER — AMPHETAMINE-DEXTROAMPHETAMINE 20 MG PO TABS
ORAL_TABLET | ORAL | 0 refills | Status: DC
Start: 2022-09-10 — End: 2022-10-08

## 2022-09-10 MED ORDER — OXYCODONE HCL 5 MG PO TABS
5.0000 mg | ORAL_TABLET | Freq: Four times a day (QID) | ORAL | 0 refills | Status: DC | PRN
Start: 2022-09-26 — End: 2022-10-17
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
## 2022-09-10 NOTE — Patient Instructions (Signed)
Please start taking Oxycodone 4 times daily instead of 3 times daily.  Please continue to take medications as prescribed.  Please continue to follow low salt diet and perform moderate exercise/walking as tolerated.

## 2022-09-12 DIAGNOSIS — R109 Unspecified abdominal pain: Secondary | ICD-10-CM | POA: Diagnosis not present

## 2022-09-12 DIAGNOSIS — Z5941 Food insecurity: Secondary | ICD-10-CM | POA: Diagnosis not present

## 2022-09-12 DIAGNOSIS — N200 Calculus of kidney: Secondary | ICD-10-CM | POA: Diagnosis not present

## 2022-09-12 DIAGNOSIS — I700 Atherosclerosis of aorta: Secondary | ICD-10-CM | POA: Diagnosis not present

## 2022-09-12 DIAGNOSIS — F17210 Nicotine dependence, cigarettes, uncomplicated: Secondary | ICD-10-CM | POA: Diagnosis not present

## 2022-09-12 DIAGNOSIS — L03316 Cellulitis of umbilicus: Secondary | ICD-10-CM | POA: Diagnosis not present

## 2022-09-12 NOTE — Assessment & Plan Note (Addendum)
Lipid profile reviewed On Crestor now 

## 2022-09-12 NOTE — Assessment & Plan Note (Signed)
Last MRI lumbar spine reviewed - Progressive spondylosis at L4-L5 with worsening now moderate spinal canal stenosis. Unchanged mild to moderate bilateral neuroforaminal stenosis.  Has been evaluated by spine surgery, patient did not prefer surgical intervention. On chronic opioids for chronic low back pain 

## 2022-09-12 NOTE — Assessment & Plan Note (Signed)
Has history of panic disorder/GAD Currently better controlled with Celexa and Valium Used to take Valium and clonazepam in the past Takes Valium 5 mg nightly or as needed for panic episode now

## 2022-09-12 NOTE — Assessment & Plan Note (Signed)
BP Readings from Last 1 Encounters:  09/10/22 138/84   Well controlled with losartan 25 mg QD now Counseled for compliance with the medications Advised DASH diet and moderate exercise/walking, at least 150 mins/week
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
## 2022-09-12 NOTE — Assessment & Plan Note (Signed)
Was managed by Dr. Janna Arch  Due to underlying DDD of lumbar spine Has been evaluated by spine surgeon, patient did not want any surgical intervention Currently in severe pain Increased frequency to oxycodone 5 mg 4 times daily as needed for now -  Warned to avoid early refills  Warned to avoid THC products

## 2022-09-12 NOTE — Assessment & Plan Note (Signed)
Uncontrolled with Albuterol inhaler PRN Added Stiolto as maintenance inhaler, but compliance is questionable Needs to quit smoking Had low-dose CT chest for lung cancer screening Referred to pulmonology

## 2022-09-12 NOTE — Assessment & Plan Note (Signed)
On Adderall 20 mg daily Refilled for now 

## 2022-09-15 LAB — TOXASSURE SELECT 13 (MW), URINE

## 2022-09-16 ENCOUNTER — Ambulatory Visit (HOSPITAL_COMMUNITY): Payer: 59

## 2022-09-16 ENCOUNTER — Other Ambulatory Visit: Payer: Self-pay

## 2022-09-16 ENCOUNTER — Inpatient Hospital Stay (HOSPITAL_COMMUNITY): Admission: RE | Admit: 2022-09-16 | Payer: 59 | Source: Ambulatory Visit

## 2022-09-16 DIAGNOSIS — G894 Chronic pain syndrome: Secondary | ICD-10-CM

## 2022-09-24 ENCOUNTER — Other Ambulatory Visit: Payer: Self-pay | Admitting: Internal Medicine

## 2022-09-24 ENCOUNTER — Encounter: Payer: Self-pay | Admitting: Internal Medicine

## 2022-09-24 DIAGNOSIS — F410 Panic disorder [episodic paroxysmal anxiety] without agoraphobia: Secondary | ICD-10-CM

## 2022-09-24 MED ORDER — DIAZEPAM 5 MG PO TABS
5.0000 mg | ORAL_TABLET | Freq: Every evening | ORAL | 0 refills | Status: DC | PRN
Start: 2022-09-24 — End: 2022-10-21

## 2022-09-25 ENCOUNTER — Ambulatory Visit (HOSPITAL_COMMUNITY): Payer: 59

## 2022-09-25 ENCOUNTER — Encounter (HOSPITAL_COMMUNITY): Payer: 59

## 2022-10-08 ENCOUNTER — Other Ambulatory Visit: Payer: Self-pay | Admitting: Internal Medicine

## 2022-10-08 DIAGNOSIS — F908 Attention-deficit hyperactivity disorder, other type: Secondary | ICD-10-CM

## 2022-10-08 MED ORDER — AMPHETAMINE-DEXTROAMPHETAMINE 20 MG PO TABS: ORAL_TABLET | ORAL | 0 refills | Status: DC

## 2022-10-17 ENCOUNTER — Other Ambulatory Visit: Payer: Self-pay | Admitting: Internal Medicine

## 2022-10-17 DIAGNOSIS — M48062 Spinal stenosis, lumbar region with neurogenic claudication: Secondary | ICD-10-CM

## 2022-10-17 MED ORDER — OXYCODONE HCL 5 MG PO TABS
5.0000 mg | ORAL_TABLET | Freq: Four times a day (QID) | ORAL | 0 refills | Status: DC | PRN
Start: 2022-10-17 — End: 2022-11-17

## 2022-10-21 ENCOUNTER — Other Ambulatory Visit: Payer: Self-pay | Admitting: Internal Medicine

## 2022-10-21 DIAGNOSIS — F410 Panic disorder [episodic paroxysmal anxiety] without agoraphobia: Secondary | ICD-10-CM

## 2022-10-21 MED ORDER — DIAZEPAM 5 MG PO TABS
5.0000 mg | ORAL_TABLET | Freq: Every evening | ORAL | 0 refills | Status: DC | PRN
Start: 2022-10-23 — End: 2022-11-17

## 2022-11-04 ENCOUNTER — Encounter: Payer: Self-pay | Admitting: *Deleted

## 2022-11-05 ENCOUNTER — Other Ambulatory Visit: Payer: Self-pay | Admitting: Internal Medicine

## 2022-11-05 DIAGNOSIS — F908 Attention-deficit hyperactivity disorder, other type: Secondary | ICD-10-CM

## 2022-11-05 MED ORDER — AMPHETAMINE-DEXTROAMPHETAMINE 20 MG PO TABS: ORAL_TABLET | ORAL | 0 refills | Status: DC

## 2022-11-17 ENCOUNTER — Other Ambulatory Visit: Payer: Self-pay | Admitting: Internal Medicine

## 2022-11-17 DIAGNOSIS — F410 Panic disorder [episodic paroxysmal anxiety] without agoraphobia: Secondary | ICD-10-CM

## 2022-11-17 DIAGNOSIS — M48062 Spinal stenosis, lumbar region with neurogenic claudication: Secondary | ICD-10-CM

## 2022-11-17 MED ORDER — DIAZEPAM 5 MG PO TABS
5.0000 mg | ORAL_TABLET | Freq: Every evening | ORAL | 0 refills | Status: DC | PRN
Start: 2022-11-19 — End: 2022-12-18

## 2022-11-17 MED ORDER — OXYCODONE HCL 5 MG PO TABS
5.0000 mg | ORAL_TABLET | Freq: Four times a day (QID) | ORAL | 0 refills | Status: DC | PRN
Start: 2022-11-21 — End: 2022-12-18

## 2022-12-03 ENCOUNTER — Other Ambulatory Visit: Payer: Self-pay | Admitting: Internal Medicine

## 2022-12-03 DIAGNOSIS — F908 Attention-deficit hyperactivity disorder, other type: Secondary | ICD-10-CM

## 2022-12-03 MED ORDER — AMPHETAMINE-DEXTROAMPHETAMINE 20 MG PO TABS
ORAL_TABLET | ORAL | 0 refills | Status: AC
Start: 2022-12-03 — End: ?

## 2022-12-11 ENCOUNTER — Ambulatory Visit: Payer: 59 | Admitting: Internal Medicine

## 2022-12-18 ENCOUNTER — Other Ambulatory Visit: Payer: Self-pay | Admitting: Internal Medicine

## 2022-12-18 DIAGNOSIS — M48062 Spinal stenosis, lumbar region with neurogenic claudication: Secondary | ICD-10-CM

## 2022-12-18 DIAGNOSIS — F410 Panic disorder [episodic paroxysmal anxiety] without agoraphobia: Secondary | ICD-10-CM

## 2022-12-18 MED ORDER — DIAZEPAM 5 MG PO TABS
5.0000 mg | ORAL_TABLET | Freq: Every evening | ORAL | 0 refills | Status: DC | PRN
Start: 2022-12-18 — End: 2023-01-07

## 2022-12-18 MED ORDER — OXYCODONE HCL 5 MG PO TABS
5.0000 mg | ORAL_TABLET | Freq: Four times a day (QID) | ORAL | 0 refills | Status: DC | PRN
Start: 2022-12-18 — End: 2023-01-07

## 2022-12-22 ENCOUNTER — Ambulatory Visit: Payer: 59 | Admitting: Internal Medicine

## 2022-12-29 ENCOUNTER — Telehealth: Payer: Self-pay | Admitting: Internal Medicine
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
## 2022-12-29 NOTE — Telephone Encounter (Signed)
Vm not set up

## 2022-12-30 ENCOUNTER — Other Ambulatory Visit: Payer: Self-pay | Admitting: Internal Medicine

## 2022-12-30 DIAGNOSIS — F908 Attention-deficit hyperactivity disorder, other type: Secondary | ICD-10-CM

## 2022-12-30 DIAGNOSIS — F909 Attention-deficit hyperactivity disorder, unspecified type: Secondary | ICD-10-CM

## 2022-12-30 MED ORDER — AMPHETAMINE-DEXTROAMPHETAMINE 20 MG PO TABS: ORAL_TABLET | ORAL | 0 refills | Status: DC

## 2023-01-01 ENCOUNTER — Ambulatory Visit: Payer: 59 | Admitting: Internal Medicine

## 2023-01-07 ENCOUNTER — Other Ambulatory Visit: Payer: Self-pay | Admitting: Internal Medicine

## 2023-01-07 ENCOUNTER — Telehealth: Payer: Self-pay | Admitting: Internal Medicine

## 2023-01-07 DIAGNOSIS — F410 Panic disorder [episodic paroxysmal anxiety] without agoraphobia: Secondary | ICD-10-CM

## 2023-01-07 DIAGNOSIS — M48062 Spinal stenosis, lumbar region with neurogenic claudication: Secondary | ICD-10-CM

## 2023-01-07 MED ORDER — OXYCODONE HCL 5 MG PO TABS: 5.0000 mg | ORAL_TABLET | Freq: Four times a day (QID) | ORAL | 0 refills | Status: DC | PRN

## 2023-01-07 MED ORDER — DIAZEPAM 5 MG PO TABS
5.0000 mg | ORAL_TABLET | Freq: Every evening | ORAL | 0 refills | Status: DC | PRN
Start: 2023-01-07 — End: 2023-01-29

## 2023-01-07 NOTE — Telephone Encounter (Signed)
Pt called in regard to Valium and Oxy   Needs provider to call pharm to OK mediatation being released today

## 2023-01-07 NOTE — Telephone Encounter (Signed)
Per Dr Allena Katz we are not filling patients medication early

## 2023-01-07 NOTE — Telephone Encounter (Signed)
Patient is calling to follow up on note below

## 2023-01-08 ENCOUNTER — Encounter: Payer: Self-pay | Admitting: Internal Medicine

## 2023-01-29 ENCOUNTER — Encounter: Payer: Self-pay | Admitting: Internal Medicine

## 2023-01-29 ENCOUNTER — Telehealth (INDEPENDENT_AMBULATORY_CARE_PROVIDER_SITE_OTHER): Payer: 59 | Admitting: Internal Medicine

## 2023-01-29 DIAGNOSIS — F909 Attention-deficit hyperactivity disorder, unspecified type: Secondary | ICD-10-CM

## 2023-01-29 DIAGNOSIS — I10 Essential (primary) hypertension: Secondary | ICD-10-CM | POA: Diagnosis not present

## 2023-01-29 DIAGNOSIS — F410 Panic disorder [episodic paroxysmal anxiety] without agoraphobia: Secondary | ICD-10-CM

## 2023-01-29 DIAGNOSIS — J449 Chronic obstructive pulmonary disease, unspecified: Secondary | ICD-10-CM

## 2023-01-29 DIAGNOSIS — G894 Chronic pain syndrome: Secondary | ICD-10-CM

## 2023-01-29 DIAGNOSIS — M48062 Spinal stenosis, lumbar region with neurogenic claudication: Secondary | ICD-10-CM | POA: Diagnosis not present

## 2023-01-29 MED ORDER — OXYCODONE HCL 5 MG PO TABS: 5.0000 mg | ORAL_TABLET | Freq: Four times a day (QID) | ORAL | 0 refills | Status: DC | PRN

## 2023-01-29 MED ORDER — AMPHETAMINE-DEXTROAMPHETAMINE 20 MG PO TABS
ORAL_TABLET | ORAL | 0 refills | Status: DC
Start: 2023-01-29 — End: 2023-02-25

## 2023-01-29 MED ORDER — ALPRAZOLAM 0.5 MG PO TABS
0.5000 mg | ORAL_TABLET | Freq: Every evening | ORAL | 0 refills | Status: DC | PRN
Start: 2023-01-29 — End: 2023-02-25

## 2023-01-29 NOTE — Assessment & Plan Note (Signed)
Last MRI lumbar spine reviewed - Progressive spondylosis at L4-L5 with worsening now moderate spinal canal stenosis. Unchanged mild to moderate bilateral neuroforaminal stenosis.  Has been evaluated by spine surgery, patient did not prefer surgical intervention. On chronic opioids for chronic low back pain

## 2023-01-29 NOTE — Assessment & Plan Note (Signed)
On Adderall 20 mg daily Refilled for now 

## 2023-01-29 NOTE — Assessment & Plan Note (Signed)
Uncontrolled with Albuterol inhaler PRN Added Stiolto as maintenance inhaler, but compliance is questionable Needs to quit smoking Had low-dose CT chest for lung cancer screening Referred to pulmonology

## 2023-01-29 NOTE — Assessment & Plan Note (Signed)
Was managed by Dr. Janna Arch  Due to underlying DDD of lumbar spine Has been evaluated by spine surgeon, patient did not want any surgical intervention Currently in severe pain Increased frequency to oxycodone 5 mg 4 times daily as needed for now -  Warned to avoid early refills  Warned to avoid THC products

## 2023-01-29 NOTE — Patient Instructions (Signed)
Please start taking Xanax instead of Valium.  Please continue to take medications as prescribed.  Please continue to follow low carb diet and perform moderate exercise/walking at least 150 mins/week.
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
## 2023-01-29 NOTE — Assessment & Plan Note (Signed)
Has history of panic disorder/GAD Currently uncontrolled with Celexa and Valium Used to take Valium and clonazepam in the past Takes Valium 5 mg nightly or as needed for panic episode now - switched to Xanax 0.5 mg at bedtime  Strictly advised to avoid any illicit drug use

## 2023-01-29 NOTE — Assessment & Plan Note (Signed)
BP Readings from Last 1 Encounters:  09/10/22 138/84   Well controlled with losartan 25 mg QD now Counseled for compliance with the medications Advised DASH diet and moderate exercise/walking, at least 150 mins/week

## 2023-02-25 ENCOUNTER — Ambulatory Visit (INDEPENDENT_AMBULATORY_CARE_PROVIDER_SITE_OTHER): Payer: 59 | Admitting: Internal Medicine

## 2023-02-25 ENCOUNTER — Encounter: Payer: Self-pay | Admitting: Internal Medicine

## 2023-02-25 VITALS — BP 148/88 | HR 93 | Ht 63.0 in | Wt 216.6 lb

## 2023-02-25 DIAGNOSIS — F909 Attention-deficit hyperactivity disorder, unspecified type: Secondary | ICD-10-CM | POA: Diagnosis not present

## 2023-02-25 DIAGNOSIS — M48062 Spinal stenosis, lumbar region with neurogenic claudication: Secondary | ICD-10-CM | POA: Diagnosis not present

## 2023-02-25 DIAGNOSIS — Z79899 Other long term (current) drug therapy: Secondary | ICD-10-CM

## 2023-02-25 DIAGNOSIS — F331 Major depressive disorder, recurrent, moderate: Secondary | ICD-10-CM

## 2023-02-25 DIAGNOSIS — E782 Mixed hyperlipidemia: Secondary | ICD-10-CM

## 2023-02-25 DIAGNOSIS — G894 Chronic pain syndrome: Secondary | ICD-10-CM | POA: Diagnosis not present

## 2023-02-25 DIAGNOSIS — I10 Essential (primary) hypertension: Secondary | ICD-10-CM

## 2023-02-25 DIAGNOSIS — F410 Panic disorder [episodic paroxysmal anxiety] without agoraphobia: Secondary | ICD-10-CM

## 2023-02-25 MED ORDER — CITALOPRAM HYDROBROMIDE 20 MG PO TABS
20.0000 mg | ORAL_TABLET | Freq: Every day | ORAL | 1 refills | Status: DC
Start: 2023-02-25 — End: 2024-02-10

## 2023-02-25 MED ORDER — AMPHETAMINE-DEXTROAMPHETAMINE 20 MG PO TABS: ORAL_TABLET | ORAL | 0 refills | Status: DC

## 2023-02-25 MED ORDER — ROSUVASTATIN CALCIUM 10 MG PO TABS
10.0000 mg | ORAL_TABLET | Freq: Every day | ORAL | 1 refills | Status: DC
Start: 2023-02-25 — End: 2024-02-09

## 2023-02-25 MED ORDER — LOSARTAN POTASSIUM 25 MG PO TABS: 25.0000 mg | ORAL_TABLET | Freq: Every day | ORAL | 1 refills | Status: AC

## 2023-02-25 MED ORDER — OXYCODONE HCL 5 MG PO TABS: 5.0000 mg | ORAL_TABLET | Freq: Four times a day (QID) | ORAL | 0 refills | Status: DC | PRN

## 2023-02-25 MED ORDER — ALPRAZOLAM 0.5 MG PO TABS
0.5000 mg | ORAL_TABLET | Freq: Every evening | ORAL | 0 refills | Status: DC | PRN
Start: 2023-02-25 — End: 2023-03-23

## 2023-02-25 NOTE — Assessment & Plan Note (Signed)
Was managed by Dr. Janna Arch in the past  Due to underlying DDD of lumbar spine Has been evaluated by spine surgeon, patient did not want any surgical intervention Currently in severe pain Increased frequency to oxycodone 5 mg 4 times daily as needed for now -  Warned to avoid early refills  Warned to avoid THC products
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
## 2023-02-25 NOTE — Patient Instructions (Signed)
Please start taking Losartan 25 mg once daily.  Please start taking Celexa 20 mg once daily.  Please continue to take medications as prescribed.  Please continue to follow low carb diet and perform moderate exercise/walking at least 150 mins/week.

## 2023-02-25 NOTE — Assessment & Plan Note (Signed)
Has history of panic disorder/GAD Better controlled with Xanax Used to take Valium and clonazepam in the past Switched to Xanax 0.5 mg at bedtime in the last visit Needs to restart Celexa at a lower dose - 20 mg QD Strictly advised to avoid any illicit drug use

## 2023-02-25 NOTE — Assessment & Plan Note (Signed)
Last MRI lumbar spine reviewed - Progressive spondylosis at L4-L5 with worsening now moderate spinal canal stenosis. Unchanged mild to moderate bilateral neuroforaminal stenosis.  Has been evaluated by spine surgery, patient did not prefer surgical intervention. On chronic opioids for chronic low back pain

## 2023-02-25 NOTE — Assessment & Plan Note (Addendum)
Well-controlled On Adderall 20 mg daily Refilled, PDMP reviewed

## 2023-02-25 NOTE — Assessment & Plan Note (Addendum)
BP Readings from Last 1 Encounters:  02/25/23 (!) 148/88   Uncontrolled due to noncompliance, restart losartan 25 mg QD now - advised to take 1/2 tablet if BP is close to 100s/60s Counseled for compliance with the medications Advised DASH diet and moderate exercise/walking, at least 150 mins/week

## 2023-02-25 NOTE — Assessment & Plan Note (Signed)
Lipid profile reviewed On Crestor now
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
## 2023-02-26 LAB — CBC
Hematocrit: 43 % (ref 34.0–46.6)
Hemoglobin: 14.2 g/dL (ref 11.1–15.9)
MCH: 28.1 pg (ref 26.6–33.0)
MCHC: 33 g/dL (ref 31.5–35.7)
MCV: 85 fL (ref 79–97)
Platelets: 348 10*3/uL (ref 150–450)
RBC: 5.05 x10E6/uL (ref 3.77–5.28)
RDW: 13.1 % (ref 11.7–15.4)
WBC: 10.2 10*3/uL (ref 3.4–10.8)

## 2023-02-26 LAB — BASIC METABOLIC PANEL
BUN/Creatinine Ratio: 12 (ref 9–23)
BUN: 11 mg/dL (ref 6–24)
CO2: 23 mmol/L (ref 20–29)
Calcium: 9.7 mg/dL (ref 8.7–10.2)
Chloride: 105 mmol/L (ref 96–106)
Creatinine, Ser: 0.92 mg/dL (ref 0.57–1.00)
Glucose: 96 mg/dL (ref 70–99)
Potassium: 4.4 mmol/L (ref 3.5–5.2)
Sodium: 146 mmol/L — ABNORMAL HIGH (ref 134–144)
eGFR: 74 mL/min/{1.73_m2} (ref >59)

## 2023-03-01 LAB — TOXASSURE SELECT 13 (MW), URINE

## 2023-03-23 ENCOUNTER — Other Ambulatory Visit: Payer: Self-pay | Admitting: Internal Medicine

## 2023-03-23 DIAGNOSIS — M48062 Spinal stenosis, lumbar region with neurogenic claudication: Secondary | ICD-10-CM

## 2023-03-23 DIAGNOSIS — F909 Attention-deficit hyperactivity disorder, unspecified type: Secondary | ICD-10-CM

## 2023-03-23 DIAGNOSIS — F410 Panic disorder [episodic paroxysmal anxiety] without agoraphobia: Secondary | ICD-10-CM

## 2023-03-23 MED ORDER — ALPRAZOLAM 0.5 MG PO TABS: 0.5000 mg | ORAL_TABLET | Freq: Every evening | ORAL | 2 refills | Status: DC | PRN

## 2023-03-23 MED ORDER — OXYCODONE HCL 5 MG PO TABS: 5.0000 mg | ORAL_TABLET | Freq: Four times a day (QID) | ORAL | 0 refills | Status: DC | PRN

## 2023-03-23 MED ORDER — AMPHETAMINE-DEXTROAMPHETAMINE 20 MG PO TABS: ORAL_TABLET | ORAL | 0 refills | Status: DC

## 2023-04-21 ENCOUNTER — Other Ambulatory Visit: Payer: Self-pay | Admitting: Internal Medicine

## 2023-04-21 DIAGNOSIS — F909 Attention-deficit hyperactivity disorder, unspecified type: Secondary | ICD-10-CM

## 2023-04-21 DIAGNOSIS — M48062 Spinal stenosis, lumbar region with neurogenic claudication: Secondary | ICD-10-CM

## 2023-04-21 MED ORDER — OXYCODONE HCL 5 MG PO TABS: 5.0000 mg | ORAL_TABLET | Freq: Four times a day (QID) | ORAL | 0 refills | Status: DC | PRN

## 2023-04-21 MED ORDER — AMPHETAMINE-DEXTROAMPHETAMINE 20 MG PO TABS: ORAL_TABLET | ORAL | 0 refills | Status: DC

## 2023-05-06 ENCOUNTER — Other Ambulatory Visit: Payer: Self-pay

## 2023-05-06 DIAGNOSIS — Z122 Encounter for screening for malignant neoplasm of respiratory organs: Secondary | ICD-10-CM

## 2023-05-06 DIAGNOSIS — Z87891 Personal history of nicotine dependence: Secondary | ICD-10-CM

## 2023-05-18 ENCOUNTER — Other Ambulatory Visit: Payer: Self-pay | Admitting: Internal Medicine

## 2023-05-18 ENCOUNTER — Encounter: Payer: Self-pay | Admitting: Internal Medicine

## 2023-05-18 DIAGNOSIS — F909 Attention-deficit hyperactivity disorder, unspecified type: Secondary | ICD-10-CM

## 2023-05-18 DIAGNOSIS — M48062 Spinal stenosis, lumbar region with neurogenic claudication: Secondary | ICD-10-CM

## 2023-05-19 ENCOUNTER — Other Ambulatory Visit: Payer: Self-pay | Admitting: Internal Medicine

## 2023-05-19 DIAGNOSIS — F909 Attention-deficit hyperactivity disorder, unspecified type: Secondary | ICD-10-CM

## 2023-05-19 DIAGNOSIS — M48062 Spinal stenosis, lumbar region with neurogenic claudication: Secondary | ICD-10-CM

## 2023-05-19 MED ORDER — OXYCODONE HCL 5 MG PO TABS: 5.0000 mg | ORAL_TABLET | Freq: Four times a day (QID) | ORAL | 0 refills | Status: DC | PRN

## 2023-05-19 MED ORDER — AMPHETAMINE-DEXTROAMPHETAMINE 20 MG PO TABS: ORAL_TABLET | ORAL | 0 refills | Status: DC

## 2023-05-27 ENCOUNTER — Ambulatory Visit: Payer: 59 | Admitting: Internal Medicine

## 2023-06-04 ENCOUNTER — Encounter: Payer: Self-pay | Admitting: Internal Medicine

## 2023-06-11 ENCOUNTER — Other Ambulatory Visit: Payer: Self-pay | Admitting: Internal Medicine

## 2023-06-11 DIAGNOSIS — F410 Panic disorder [episodic paroxysmal anxiety] without agoraphobia: Secondary | ICD-10-CM

## 2023-06-18 ENCOUNTER — Telehealth (INDEPENDENT_AMBULATORY_CARE_PROVIDER_SITE_OTHER): Payer: Self-pay | Admitting: Internal Medicine

## 2023-06-18 ENCOUNTER — Other Ambulatory Visit: Payer: Self-pay | Admitting: Internal Medicine

## 2023-06-18 ENCOUNTER — Encounter: Payer: Self-pay | Admitting: Internal Medicine

## 2023-06-18 ENCOUNTER — Ambulatory Visit: Payer: Self-pay

## 2023-06-18 DIAGNOSIS — J441 Chronic obstructive pulmonary disease with (acute) exacerbation: Secondary | ICD-10-CM | POA: Diagnosis not present

## 2023-06-18 DIAGNOSIS — F909 Attention-deficit hyperactivity disorder, unspecified type: Secondary | ICD-10-CM

## 2023-06-18 DIAGNOSIS — M48062 Spinal stenosis, lumbar region with neurogenic claudication: Secondary | ICD-10-CM

## 2023-06-18 DIAGNOSIS — F410 Panic disorder [episodic paroxysmal anxiety] without agoraphobia: Secondary | ICD-10-CM

## 2023-06-18 MED ORDER — AMPHETAMINE-DEXTROAMPHETAMINE 20 MG PO TABS: ORAL_TABLET | ORAL | 0 refills | Status: DC

## 2023-06-18 MED ORDER — BENZONATATE 100 MG PO CAPS: 200.0000 mg | ORAL_CAPSULE | Freq: Three times a day (TID) | ORAL | 0 refills | Status: DC | PRN

## 2023-06-18 MED ORDER — OXYCODONE HCL 5 MG PO TABS: 5.0000 mg | ORAL_TABLET | Freq: Four times a day (QID) | ORAL | 0 refills | Status: DC | PRN

## 2023-06-18 MED ORDER — PREDNISONE 20 MG PO TABS: 40.0000 mg | ORAL_TABLET | Freq: Every day | ORAL | 0 refills | Status: AC

## 2023-06-18 MED ORDER — AZITHROMYCIN 250 MG PO TABS: ORAL_TABLET | ORAL | 0 refills | Status: DC

## 2023-06-18 NOTE — Telephone Encounter (Signed)
 Patient seeing dr Durwin Nora today 06/18/2023

## 2023-06-18 NOTE — Progress Notes (Signed)
 Virtual Visit via Video Note  I connected with Leslie Lowery on 06/18/23 at  3:00 PM EDT by a video enabled telemedicine application and verified that I am speaking with the correct person using two identifiers.  Patient Location: Home Provider Location: Office/Clinic  I discussed the limitations, risks, security, and privacy concerns of performing an evaluation and management service by video and the availability of in person appointments. I also discussed with the patient that there may be a patient responsible charge related to this service. The patient expressed understanding and agreed to proceed.  Subjective: PCP: Leslie Halon, MD  Chief Complaint  Patient presents with   URI    Cough, congestion with green mucus, off and on fever for two weeks. Home covid test negative    Leslie Lowery has been evaluated for an acute visit through video encounter endorsing a 2-week history of cough productive of green sputum, sinus/nasal congestion, subjective fever, and shortness of breath.  She has a known history of COPD and has been using her albuterol inhaler more frequently over the last 2 weeks.  Multiple family members have been sick as well.  Symptoms have not improved with OTC cough/cold medications.   ROS: Per HPI  Current Outpatient Medications:    azithromycin (ZITHROMAX Z-PAK) 250 MG tablet, Take 2 tablets (500 mg) PO today, then 1 tablet (250 mg) PO daily x4 days., Disp: 6 tablet, Rfl: 0   benzonatate (TESSALON PERLES) 100 MG capsule, Take 2 capsules (200 mg total) by mouth 3 (three) times daily as needed for cough., Disp: 20 capsule, Rfl: 0   predniSONE (DELTASONE) 20 MG tablet, Take 2 tablets (40 mg total) by mouth daily for 5 days., Disp: 10 tablet, Rfl: 0   albuterol (VENTOLIN HFA) 108 (90 Base) MCG/ACT inhaler, Inhale 1-2 puffs into the lungs every 6 (six) hours as needed for wheezing or shortness of breath., Disp: , Rfl:    ALPRAZolam (XANAX) 0.5 MG tablet, Take 1 tablet  (0.5 mg total) by mouth at bedtime as needed for anxiety or sleep., Disp: 30 tablet, Rfl: 2   [START ON 06/21/2023] amphetamine-dextroamphetamine (ADDERALL) 20 MG tablet, TAKE 1 TABLET BY MOUTH DAILY, Disp: 30 tablet, Rfl: 0   aspirin EC 81 MG tablet, Take 81 mg by mouth daily. Swallow whole., Disp: , Rfl:    citalopram (CELEXA) 20 MG tablet, Take 1 tablet (20 mg total) by mouth daily., Disp: 90 tablet, Rfl: 1   losartan (COZAAR) 25 MG tablet, Take 1 tablet (25 mg total) by mouth daily., Disp: 90 tablet, Rfl: 1   [START ON 06/21/2023] oxyCODONE (OXY IR/ROXICODONE) 5 MG immediate release tablet, Take 1 tablet (5 mg total) by mouth every 6 (six) hours as needed for severe pain (pain score 7-10)., Disp: 120 tablet, Rfl: 0   rosuvastatin (CRESTOR) 10 MG tablet, Take 1 tablet (10 mg total) by mouth daily., Disp: 90 tablet, Rfl: 1   Tiotropium Bromide-Olodaterol (STIOLTO RESPIMAT) 2.5-2.5 MCG/ACT AERS, Inhale 2 puffs into the lungs daily., Disp: 4 g, Rfl: 11   vitamin B-12 (CYANOCOBALAMIN) 500 MCG tablet, Take 1 tablet (500 mcg total) by mouth daily. (Patient not taking: Reported on 05/02/2022), Disp:  , Rfl:   Assessment and Plan:  COPD with acute exacerbation [facility]) Assessment & Plan: Evaluated today for an acute visit through video encounter endorsing a 2-week history of symptoms described above.  Her symptoms are concerning for COPD exacerbation.  She is prescribed Stiolto for maintenance therapy, with which she endorses  compliance, as well as albuterol for rescue use.  She endorses increased frequency of rescue inhaler use.  Her cough has been productive of green sputum. -Treatment options reviewed.  Z-Pak and prednisone 40 mg x 5 days prescribed for treatment of COPD exacerbation.  Tessalon Perles added for as needed cough relief.  She was instructed return to care if symptoms worsen or fail to improve.  Follow Up Instructions: Return if symptoms worsen or fail to improve.   I discussed the assessment  and treatment plan with the patient. The patient was provided an opportunity to ask questions, and all were answered. The patient agreed with the plan and demonstrated an understanding of the instructions.   The patient was advised to call back or seek an in-person evaluation if the symptoms worsen or if the condition fails to improve as anticipated.  The above assessment and management plan was discussed with the patient. The patient verbalized understanding of and has agreed to the management plan.   Billie Lade, MD

## 2023-06-18 NOTE — Telephone Encounter (Signed)
 Copied from CRM [id number]. Topic: Clinical - Red Word Triage >> Jun 18, 2023  1:52 PM Elle L wrote: Red Word that prompted transfer to Nurse Triage: The patient has been sick with congestion, fever, discolored mucus, and a cough. It started two weeks ago and continues to worsen.   Chief Complaint: Cough Symptoms: cough, green nasal drainage, possible fever, congestion, SOB, weak, tired Frequency: Ongoing for 2 weeks and getting worse Pertinent Negatives: Patient denies chest pain  Disposition: [] ED /[] Urgent Care (no appt availability in office) / [] Appointment(In office/virtual)/ []  [facility] [place] [facility]/ [] Home Care/ [] Refused Recommended Disposition /[] [facility]/ []  Follow-up with PCP Additional Notes: Patient has been feeling sick for the past couple weeks, but now her symptoms are getting worse. She stated that she is having a productive cough with green phlegm, congestion, SOB, weakness, fatigue, and she suspects that she might be having intermittent fevers. She wants medication to be prescribed. Informed patient that she will need an appointment. Patient declined to come to the office because she has children with her. She also does not want to go to urgent care. Virtual appt scheduled for today.  Reason for Disposition  [1] MILD difficulty breathing (e.g., minimal/no SOB at rest, SOB with walking, pulse <100) AND [2] still present when not coughing  Answer Assessment - Initial Assessment Questions 1. ONSET: "When did the cough begin?"      2 weeks   2. SEVERITY: "How bad is the cough today?"      Constant, interfering with daily activity  3. SPUTUM: "Describe the color of your sputum" (none, dry cough; clear, white, yellow, green)     Green   4. DIFFICULTY BREATHING: "Are you having difficulty breathing?" If Yes, ask: "How bad is it?" (e.g., mild, moderate, severe)       Patient is having some congestion and SOB that worsened in the past week  5. FEVER: "Do you have  a fever?" If Yes, ask: "What is your temperature, how was it measured, and when did it start?"     Patient feels feverish at times, wakes up sweaty but her temp is unknown  6. OTHER SYMPTOMS: "Do you have any other symptoms?" (e.g., runny nose, wheezing, chest pain)       Green nasal drainage  Protocols used: Cough - Acute Productive-A-AH

## 2023-06-18 NOTE — Assessment & Plan Note (Signed)
 Evaluated today for an acute visit through video encounter endorsing a 2-week history of symptoms described above.  Her symptoms are concerning for COPD exacerbation.  She is prescribed Stiolto for maintenance therapy, with which she endorses compliance, as well as albuterol for rescue use.  She endorses increased frequency of rescue inhaler use.  Her cough has been productive of green sputum. -Treatment options reviewed.  Z-Pak and prednisone 40 mg x 5 days prescribed for treatment of COPD exacerbation.  Tessalon Perles added for as needed cough relief.  She was instructed return to care if symptoms worsen or fail to improve.

## 2023-06-19 ENCOUNTER — Other Ambulatory Visit: Payer: Self-pay | Admitting: Internal Medicine

## 2023-06-19 DIAGNOSIS — F410 Panic disorder [episodic paroxysmal anxiety] without agoraphobia: Secondary | ICD-10-CM

## 2023-06-20 ENCOUNTER — Encounter: Payer: Self-pay | Admitting: Internal Medicine

## 2023-06-22 ENCOUNTER — Other Ambulatory Visit: Payer: Self-pay | Admitting: Internal Medicine

## 2023-06-22 DIAGNOSIS — F410 Panic disorder [episodic paroxysmal anxiety] without agoraphobia: Secondary | ICD-10-CM

## 2023-06-22 MED ORDER — ALPRAZOLAM 0.5 MG PO TABS: 0.5000 mg | ORAL_TABLET | Freq: Every evening | ORAL | 2 refills | Status: DC | PRN

## 2023-06-22 NOTE — Telephone Encounter (Unsigned)
 Copied from CRM [id number]. Topic: Clinical - Prescription Issue >> Jun 22, 2023  9:05 AM Patsy Lager T wrote: Reason for CRM: Robin from [facility] called stated they do not have a new script for  ALPRAZolam (XANAX) 0.5 MG tablet. The most recent one is dated 03/02/23 and patient picked up her last refill in Feb so they are needing one for March. Please f/u with pharmacy

## 2023-07-15 ENCOUNTER — Other Ambulatory Visit: Payer: Self-pay | Admitting: Internal Medicine

## 2023-07-15 DIAGNOSIS — F909 Attention-deficit hyperactivity disorder, unspecified type: Secondary | ICD-10-CM

## 2023-07-15 DIAGNOSIS — M48062 Spinal stenosis, lumbar region with neurogenic claudication: Secondary | ICD-10-CM

## 2023-07-16 MED ORDER — AMPHETAMINE-DEXTROAMPHETAMINE 20 MG PO TABS: ORAL_TABLET | ORAL | 0 refills | Status: DC

## 2023-07-16 MED ORDER — OXYCODONE HCL 5 MG PO TABS
5.0000 mg | ORAL_TABLET | Freq: Four times a day (QID) | ORAL | 0 refills | Status: DC | PRN
Start: 2023-07-17 — End: 2023-08-17

## 2023-08-05 ENCOUNTER — Ambulatory Visit (HOSPITAL_COMMUNITY): Payer: 59 | Attending: Oncology

## 2023-08-17 ENCOUNTER — Other Ambulatory Visit: Payer: Self-pay | Admitting: Internal Medicine

## 2023-08-17 ENCOUNTER — Encounter: Payer: Self-pay | Admitting: Internal Medicine

## 2023-08-17 DIAGNOSIS — M48062 Spinal stenosis, lumbar region with neurogenic claudication: Secondary | ICD-10-CM

## 2023-08-17 DIAGNOSIS — F909 Attention-deficit hyperactivity disorder, unspecified type: Secondary | ICD-10-CM

## 2023-08-28 ENCOUNTER — Encounter: Payer: Self-pay | Admitting: Internal Medicine

## 2023-08-28 ENCOUNTER — Ambulatory Visit (INDEPENDENT_AMBULATORY_CARE_PROVIDER_SITE_OTHER): Payer: Self-pay | Admitting: Internal Medicine

## 2023-08-28 VITALS — BP 138/84 | HR 94 | Ht 63.0 in | Wt 211.0 lb

## 2023-08-28 DIAGNOSIS — I10 Essential (primary) hypertension: Secondary | ICD-10-CM

## 2023-08-28 DIAGNOSIS — F909 Attention-deficit hyperactivity disorder, unspecified type: Secondary | ICD-10-CM | POA: Diagnosis not present

## 2023-08-28 DIAGNOSIS — F419 Anxiety disorder, unspecified: Secondary | ICD-10-CM

## 2023-08-28 DIAGNOSIS — F331 Major depressive disorder, recurrent, moderate: Secondary | ICD-10-CM

## 2023-08-28 DIAGNOSIS — J449 Chronic obstructive pulmonary disease, unspecified: Secondary | ICD-10-CM

## 2023-08-28 DIAGNOSIS — Z1211 Encounter for screening for malignant neoplasm of colon: Secondary | ICD-10-CM

## 2023-08-28 DIAGNOSIS — M48062 Spinal stenosis, lumbar region with neurogenic claudication: Secondary | ICD-10-CM

## 2023-08-28 DIAGNOSIS — E782 Mixed hyperlipidemia: Secondary | ICD-10-CM

## 2023-08-28 DIAGNOSIS — G894 Chronic pain syndrome: Secondary | ICD-10-CM

## 2023-08-28 DIAGNOSIS — F410 Panic disorder [episodic paroxysmal anxiety] without agoraphobia: Secondary | ICD-10-CM

## 2023-08-28 MED ORDER — OXYCODONE HCL 5 MG PO TABS: 5.0000 mg | ORAL_TABLET | Freq: Four times a day (QID) | ORAL | 0 refills | Status: DC | PRN
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
## 2023-08-28 NOTE — Assessment & Plan Note (Addendum)
 Overall well-controlled On Celexa  20 mg once daily, but is noncompliant Has Xanax  0.5 mg at bedtime for panic episodes

## 2023-08-28 NOTE — Assessment & Plan Note (Addendum)
 BP Readings from Last 1 Encounters:  08/28/23 138/84   Uncontrolled due to noncompliance, restart losartan  12.5 mg QD now - advised to take 1/2 tablet of 25 mg once daily, has poor compliance to medications Counseled for compliance with the medications Advised DASH diet and moderate exercise/walking, at least 150 mins/week

## 2023-08-28 NOTE — Assessment & Plan Note (Signed)
 Well-controlled On Adderall 20 mg daily Refilled, PDMP reviewed

## 2023-08-28 NOTE — Assessment & Plan Note (Signed)
 Has history of panic disorder/GAD Better controlled with Xanax  0.5 mg qHS Used to take Valium  and clonazepam in the past Needs to restart Celexa  at a lower dose - 20 mg QD Strictly advised to avoid any illicit drug use

## 2023-08-28 NOTE — Assessment & Plan Note (Addendum)
 Uncontrolled with Albuterol  inhaler PRN Has tried sending Stiolto as maintenance inhaler, but she did not continue it Needs to quit smoking Had low-dose CT chest for lung cancer screening Referred to pulmonology, but has lost follow up

## 2023-08-28 NOTE — Assessment & Plan Note (Signed)
 Was managed by Dr. Colby Daub in the past Uncontrolled currently Due to underlying DDD of lumbar spine Has been evaluated by spine surgeon, patient did not want any surgical intervention Currently in severe pain Increased frequency to oxycodone  5 mg 4 times daily as needed for now -  Warned to avoid early refills  I had a lengthy discussion about her chronic pain management.  She is requesting OxyContin  now - I recommend pain clinic referral for discussing different pain medicine if she has uncontrolled pain. She has had hesitation to see pain clinic in the past, but agrees to see them now. Referred to [place] pain clinic Warned to avoid [facility] products

## 2023-08-28 NOTE — Assessment & Plan Note (Signed)
 Lipid profile reviewed On Crestor  now, but needs to be compliant

## 2023-08-28 NOTE — Patient Instructions (Signed)
 Please continue to take medications as prescribed.  Please continue to follow low salt diet and perform moderate exercise/walking as tolerated.

## 2023-08-28 NOTE — Assessment & Plan Note (Signed)
 Last MRI lumbar spine reviewed - Progressive spondylosis at L4-L5 with worsening now moderate spinal canal stenosis. Unchanged mild to moderate bilateral neuroforaminal stenosis.  Has been evaluated by spine surgery, patient did not prefer surgical intervention. On chronic opioids for chronic low back pain Needs to see pain clinic for her chronic pain management -referred to [facility] pain clinic

## 2023-09-01 ENCOUNTER — Encounter (INDEPENDENT_AMBULATORY_CARE_PROVIDER_SITE_OTHER): Payer: Self-pay | Admitting: *Deleted

## 2023-09-10 ENCOUNTER — Ambulatory Visit

## 2023-09-13 ENCOUNTER — Other Ambulatory Visit: Payer: Self-pay | Admitting: Internal Medicine

## 2023-09-13 DIAGNOSIS — F410 Panic disorder [episodic paroxysmal anxiety] without agoraphobia: Secondary | ICD-10-CM

## 2023-09-15 ENCOUNTER — Other Ambulatory Visit: Payer: Self-pay | Admitting: Internal Medicine

## 2023-09-15 DIAGNOSIS — F909 Attention-deficit hyperactivity disorder, unspecified type: Secondary | ICD-10-CM

## 2023-10-05 ENCOUNTER — Ambulatory Visit (HOSPITAL_COMMUNITY)
Admission: RE | Admit: 2023-10-05 | Discharge: 2023-10-05 | Disposition: A | Discharge status: Home / Self Care | Source: Ambulatory Visit | Attending: Oncology | Admitting: Oncology

## 2023-10-05 DIAGNOSIS — Z87891 Personal history of nicotine dependence: Secondary | ICD-10-CM | POA: Insufficient documentation

## 2023-10-05 DIAGNOSIS — Z122 Encounter for screening for malignant neoplasm of respiratory organs: Secondary | ICD-10-CM | POA: Insufficient documentation

## 2023-10-05 DIAGNOSIS — F17210 Nicotine dependence, cigarettes, uncomplicated: Secondary | ICD-10-CM | POA: Diagnosis not present

## 2023-10-12 ENCOUNTER — Encounter: Payer: Self-pay | Admitting: *Deleted

## 2023-10-12 NOTE — Progress Notes (Signed)
 Patient notified via mail of LDCT lung cancer screening results with recommendations to follow up in 12 months.  Also notified of incidental findings and need to follow up with PCP.  Patient's referring provider was sent a copy of results.    IMPRESSION: 1. Lung-RADS 1, negative. Continue annual screening with low-dose chest CT without contrast in 12 months. 2. Mild coronary artery calcification.

## 2023-10-13 ENCOUNTER — Encounter: Payer: Self-pay | Admitting: Internal Medicine

## 2023-10-13 ENCOUNTER — Other Ambulatory Visit: Payer: Self-pay | Admitting: Internal Medicine

## 2023-10-13 DIAGNOSIS — M48062 Spinal stenosis, lumbar region with neurogenic claudication: Secondary | ICD-10-CM

## 2023-10-13 DIAGNOSIS — F909 Attention-deficit hyperactivity disorder, unspecified type: Secondary | ICD-10-CM

## 2023-10-22 ENCOUNTER — Ambulatory Visit (INDEPENDENT_AMBULATORY_CARE_PROVIDER_SITE_OTHER): Admitting: Obstetrics & Gynecology

## 2023-10-22 ENCOUNTER — Encounter: Payer: Self-pay | Admitting: Obstetrics & Gynecology

## 2023-10-22 ENCOUNTER — Other Ambulatory Visit (HOSPITAL_COMMUNITY)
Admission: RE | Admit: 2023-10-22 | Discharge: 2023-10-22 | Disposition: A | Discharge status: Home / Self Care | Source: Ambulatory Visit | Attending: Obstetrics & Gynecology | Admitting: Obstetrics & Gynecology

## 2023-10-22 VITALS — BP 166/97 | HR 101 | Ht 63.0 in | Wt 212.4 lb

## 2023-10-22 DIAGNOSIS — R102 Pelvic and perineal pain: Secondary | ICD-10-CM

## 2023-10-22 DIAGNOSIS — Z01419 Encounter for gynecological examination (general) (routine) without abnormal findings: Secondary | ICD-10-CM | POA: Insufficient documentation

## 2023-10-22 DIAGNOSIS — Z113 Encounter for screening for infections with a predominantly sexual mode of transmission: Secondary | ICD-10-CM | POA: Diagnosis not present

## 2023-10-22 DIAGNOSIS — N951 Menopausal and female climacteric states: Secondary | ICD-10-CM

## 2023-10-22 DIAGNOSIS — Z1151 Encounter for screening for human papillomavirus (HPV): Secondary | ICD-10-CM | POA: Diagnosis not present

## 2023-10-22 NOTE — Progress Notes (Signed)
 WELL-WOMAN EXAMINATION Patient name: Leslie Lowery MRN [id number]  Date of birth: 11/25/1970 Chief Complaint:   Gynecologic Exam (Wants HIV, RPR labs)  History of Present Illness:   Leslie Lowery is a 53 y.o. [phone] PM female being seen today for a routine well-woman exam.   Notes intermittent pain on the right side- seen in ER.  Rates her pain 7/10, typically after intercourse.  States the pain is nagging, sharp at times.  Pain will last for about a hour and then ease off.  Taking medication for chronic pain, does not take additional medication for this pain.  Notes burning with urination- denies discharge or irritation.  This happened about a month ago and lasted for 3 weeks then resolved on its own.  Of note she found that her partner was unfaithful and desires full STI screening.    For the past year or 2 she has noted considerable hot flashes and night sweats.  She has not taken anything to help with her symptoms.  Patient's last menstrual period was 11/01/2008 (approximate).  Last pap due today.  Last mammogram: Ordered. Last colonoscopy: not discussed     10/22/2023    1:44 PM 08/28/2023   10:25 AM 02/25/2023   11:29 AM 01/29/2023   10:25 AM 09/10/2022   11:04 AM  Depression screen PHQ 2/9  Decreased Interest 1 0 1 1 1   Down, Depressed, Hopeless 1 0 1 1 1   PHQ - 2 Score 2 0 2 2 2   Altered sleeping 2 0 2 2 2   Tired, decreased energy 2 0 2 2 2   Change in appetite 1 0 2 2 2   Feeling bad or failure about yourself  1 0 1 1 1   Trouble concentrating 2 0 1 1 1   Moving slowly or fidgety/restless 1 0 0 0 1  Suicidal thoughts 0 0 0 0 0  PHQ-9 Score 11 0 10 10 11   Difficult doing work/chores   Very difficult Very difficult Very difficult      Review of Systems:   Pertinent items are noted in HPI Denies any headaches, blurred vision, fatigue, shortness of breath, chest pain, abdominal pain, bowel movements, urination, or intercourse unless otherwise stated  above.  Pertinent History Reviewed:  Reviewed past medical,surgical, social and family history.  Reviewed problem list, medications and allergies. Physical Assessment:   Vitals:   10/22/23 1334 10/22/23 1413  BP: (!) 152/93 (!) 166/97  Pulse: 97 (!) 101  Weight: 212 lb 6.4 oz (96.3 kg)   Height: 5' 3 (1.6 m)   Body mass index is 37.62 kg/m.        Physical Examination:   General appearance - well appearing, and in no distress  Mental status - alert, oriented to person, place, and time  Psych:  She has a normal mood and affect  Skin - warm and dry, normal color, no suspicious lesions noted  Chest - effort normal, all lung fields clear to auscultation bilaterally  Heart - normal rate and regular rhythm  Neck:  midline trachea, no thyromegaly or nodules  Breasts - breasts appear normal, no suspicious masses, no skin or nipple changes or  axillary nodes  Abdomen - soft, nontender, nondistended, no masses or organomegaly  Pelvic - VULVA: normal appearing vulva with no masses, tenderness or lesions  VAGINA: normal appearing vagina with normal color and discharge, no lesions  CERVIX: normal appearing cervix without discharge or lesions, no CMT  Thin prep pap is done with HR  HPV cotesting  UTERUS: uterus is felt to be normal size, shape, consistency and nontender   ADNEXA: No adnexal masses or tenderness noted.  Extremities:  No swelling or varicosities noted  Chaperone: Clarita Salt     Assessment & Plan:  1) Well-Woman Exam -Pap collected, reviewed ASCCP guidelines - Patient notes that she is supposed to have follow-up mammogram with Zelda Salmon however no prior records seen in chart.  Encourage patient to contact [facility] regarding imaging  2) STI screening - Based on clinical history, concern for HSV however it has been several weeks since symptoms.  Will plan for blood work  3) Vasomotor symptoms -pt reports h/o TIA - Reviewed conservative options including herbal supplements.   Advised patient to try magnesium lotion and cherry juice and consideration for Estroven complete - Patient to follow-up if she notes worsening of symptoms or desires to discuss medical management  4) pelvic pain - Plan for pelvic ultrasound to rule out underlying etiology  Orders Placed This Encounter  Procedures   US  PELVIC COMPLETE WITH TRANSVAGINAL   HIV Antibody (routine testing w rflx)   RPR   HSV 1 and 2 Ab, IgG    Meds: No orders of the defined types were placed in this encounter.   Follow-up: Return in about 1 year (around 10/21/2024) for Annual and pelvic US .   Grayce Budden, DO Attending Obstetrician & Gynecologist, [facility]

## 2023-10-23 LAB — HSV 1 AND 2 AB, IGG
HSV 1 Glycoprotein G Ab, IgG: NONREACTIVE
HSV 2 IgG, Type Spec: REACTIVE — AB

## 2023-10-23 LAB — RPR: RPR Ser Ql: NONREACTIVE

## 2023-10-23 LAB — HIV ANTIBODY (ROUTINE TESTING W REFLEX): HIV Screen 4th Generation wRfx: NONREACTIVE

## 2023-10-27 ENCOUNTER — Ambulatory Visit: Payer: Self-pay | Admitting: Obstetrics & Gynecology

## 2023-10-27 DIAGNOSIS — B009 Herpesviral infection, unspecified: Secondary | ICD-10-CM

## 2023-10-27 MED ORDER — VALACYCLOVIR HCL 500 MG PO TABS: 500.0000 mg | ORAL_TABLET | Freq: Two times a day (BID) | ORAL | 0 refills | Status: DC

## 2023-10-27 MED ORDER — VALACYCLOVIR HCL 500 MG PO TABS: 500.0000 mg | ORAL_TABLET | Freq: Every day | ORAL | 4 refills | Status: AC

## 2023-10-27 MED ORDER — VALACYCLOVIR HCL 500 MG PO TABS: 500.0000 mg | ORAL_TABLET | Freq: Two times a day (BID) | ORAL | 0 refills | Status: AC

## 2023-10-28 ENCOUNTER — Ambulatory Visit (HOSPITAL_COMMUNITY)
Admission: RE | Admit: 2023-10-28 | Discharge: 2023-10-28 | Disposition: A | Discharge status: Home / Self Care | Source: Ambulatory Visit | Attending: Obstetrics & Gynecology | Admitting: Obstetrics & Gynecology

## 2023-10-28 DIAGNOSIS — R102 Pelvic and perineal pain: Secondary | ICD-10-CM | POA: Diagnosis present

## 2023-10-30 LAB — CYTOLOGY - PAP
Adequacy: ABSENT
Chlamydia: NEGATIVE
Comment: NEGATIVE
Comment: NEGATIVE
Comment: NEGATIVE
Comment: NEGATIVE
Comment: NEGATIVE
Comment: NORMAL
Diagnosis: NEGATIVE
HPV 16: NEGATIVE
HPV 18 / 45: NEGATIVE
High risk HPV: POSITIVE — AB
Neisseria Gonorrhea: NEGATIVE
Trichomonas: NEGATIVE

## 2023-11-05 ENCOUNTER — Telehealth: Payer: Self-pay | Admitting: Obstetrics & Gynecology

## 2023-11-05 NOTE — Telephone Encounter (Signed)
 Patient calling to check on lab & ultrasound results. Please advise.

## 2023-11-09 ENCOUNTER — Other Ambulatory Visit: Payer: Self-pay | Admitting: Internal Medicine

## 2023-11-09 ENCOUNTER — Encounter: Payer: Self-pay | Admitting: Internal Medicine

## 2023-11-09 DIAGNOSIS — F909 Attention-deficit hyperactivity disorder, unspecified type: Secondary | ICD-10-CM

## 2023-11-09 DIAGNOSIS — M48062 Spinal stenosis, lumbar region with neurogenic claudication: Secondary | ICD-10-CM

## 2023-11-09 MED ORDER — AMPHETAMINE-DEXTROAMPHETAMINE 20 MG PO TABS: 20.0000 mg | ORAL_TABLET | Freq: Every day | ORAL | 0 refills | Status: DC

## 2023-11-09 MED ORDER — OXYCODONE HCL 5 MG PO TABS: 5.0000 mg | ORAL_TABLET | Freq: Four times a day (QID) | ORAL | 0 refills | Status: DC | PRN

## 2023-11-27 ENCOUNTER — Ambulatory Visit: Admitting: Internal Medicine

## 2023-11-27 ENCOUNTER — Encounter: Payer: Self-pay | Admitting: Internal Medicine

## 2023-12-06 ENCOUNTER — Other Ambulatory Visit: Payer: Self-pay | Admitting: Internal Medicine

## 2023-12-06 DIAGNOSIS — F410 Panic disorder [episodic paroxysmal anxiety] without agoraphobia: Secondary | ICD-10-CM

## 2023-12-08 ENCOUNTER — Encounter: Payer: Self-pay | Admitting: Internal Medicine

## 2023-12-08 DIAGNOSIS — M545 Low back pain, unspecified: Secondary | ICD-10-CM | POA: Diagnosis not present

## 2023-12-08 DIAGNOSIS — G894 Chronic pain syndrome: Secondary | ICD-10-CM | POA: Diagnosis not present

## 2023-12-08 DIAGNOSIS — M5136 Other intervertebral disc degeneration, lumbar region with discogenic back pain only: Secondary | ICD-10-CM | POA: Diagnosis not present

## 2023-12-09 ENCOUNTER — Other Ambulatory Visit: Payer: Self-pay | Admitting: Internal Medicine

## 2023-12-09 DIAGNOSIS — F909 Attention-deficit hyperactivity disorder, unspecified type: Secondary | ICD-10-CM

## 2023-12-09 MED ORDER — AMPHETAMINE-DEXTROAMPHETAMINE 20 MG PO TABS: 20.0000 mg | ORAL_TABLET | Freq: Every day | ORAL | 0 refills | Status: DC

## 2024-01-04 ENCOUNTER — Emergency Department (HOSPITAL_COMMUNITY)

## 2024-01-04 ENCOUNTER — Encounter (HOSPITAL_COMMUNITY): Payer: Self-pay | Admitting: Emergency Medicine

## 2024-01-04 ENCOUNTER — Other Ambulatory Visit: Payer: Self-pay

## 2024-01-04 ENCOUNTER — Emergency Department (HOSPITAL_COMMUNITY)
Admission: EM | Admit: 2024-01-04 | Discharge: 2024-01-04 | Disposition: A | Discharge status: Home / Self Care | Source: Other Acute Inpatient Hospital | Attending: Emergency Medicine | Admitting: Emergency Medicine

## 2024-01-04 DIAGNOSIS — R471 Dysarthria and anarthria: Secondary | ICD-10-CM | POA: Diagnosis not present

## 2024-01-04 DIAGNOSIS — Z79899 Other long term (current) drug therapy: Secondary | ICD-10-CM | POA: Diagnosis not present

## 2024-01-04 DIAGNOSIS — R29701 NIHSS score 1: Secondary | ICD-10-CM

## 2024-01-04 DIAGNOSIS — Z7982 Long term (current) use of aspirin: Secondary | ICD-10-CM | POA: Insufficient documentation

## 2024-01-04 DIAGNOSIS — R000 Tachycardia, unspecified: Secondary | ICD-10-CM | POA: Diagnosis not present

## 2024-01-04 DIAGNOSIS — R4781 Slurred speech: Secondary | ICD-10-CM | POA: Diagnosis not present

## 2024-01-04 DIAGNOSIS — R42 Dizziness and giddiness: Secondary | ICD-10-CM | POA: Insufficient documentation

## 2024-01-04 DIAGNOSIS — R519 Headache, unspecified: Secondary | ICD-10-CM | POA: Diagnosis not present

## 2024-01-04 DIAGNOSIS — R29810 Facial weakness: Secondary | ICD-10-CM | POA: Diagnosis not present

## 2024-01-04 DIAGNOSIS — I10 Essential (primary) hypertension: Secondary | ICD-10-CM | POA: Diagnosis not present

## 2024-01-04 DIAGNOSIS — E876 Hypokalemia: Secondary | ICD-10-CM | POA: Insufficient documentation

## 2024-01-04 DIAGNOSIS — I672 Cerebral atherosclerosis: Secondary | ICD-10-CM | POA: Diagnosis not present

## 2024-01-04 DIAGNOSIS — R200 Anesthesia of skin: Secondary | ICD-10-CM | POA: Insufficient documentation

## 2024-01-04 DIAGNOSIS — G459 Transient cerebral ischemic attack, unspecified: Secondary | ICD-10-CM | POA: Diagnosis not present

## 2024-01-04 DIAGNOSIS — I639 Cerebral infarction, unspecified: Secondary | ICD-10-CM

## 2024-01-04 DIAGNOSIS — R29818 Other symptoms and signs involving the nervous system: Secondary | ICD-10-CM | POA: Diagnosis not present

## 2024-01-04 DIAGNOSIS — I700 Atherosclerosis of aorta: Secondary | ICD-10-CM | POA: Diagnosis not present

## 2024-01-04 DIAGNOSIS — G4489 Other headache syndrome: Secondary | ICD-10-CM | POA: Diagnosis not present

## 2024-01-04 DIAGNOSIS — J449 Chronic obstructive pulmonary disease, unspecified: Secondary | ICD-10-CM | POA: Diagnosis not present

## 2024-01-04 DIAGNOSIS — R531 Weakness: Secondary | ICD-10-CM | POA: Insufficient documentation

## 2024-01-04 DIAGNOSIS — D72829 Elevated white blood cell count, unspecified: Secondary | ICD-10-CM | POA: Diagnosis not present

## 2024-01-04 DIAGNOSIS — R202 Paresthesia of skin: Secondary | ICD-10-CM | POA: Insufficient documentation

## 2024-01-04 DIAGNOSIS — I4510 Unspecified right bundle-branch block: Secondary | ICD-10-CM | POA: Insufficient documentation

## 2024-01-04 DIAGNOSIS — I6523 Occlusion and stenosis of bilateral carotid arteries: Secondary | ICD-10-CM | POA: Diagnosis not present

## 2024-01-04 DIAGNOSIS — R2990 Unspecified symptoms and signs involving the nervous system: Secondary | ICD-10-CM

## 2024-01-04 LAB — I-STAT CHEM 8, ED
BUN: 13 mg/dL (ref 6–20)
Calcium, Ion: 1.12 mmol/L — ABNORMAL LOW (ref 1.15–1.40)
Chloride: 102 mmol/L (ref 98–111)
Creatinine, Ser: 1 mg/dL (ref 0.44–1.00)
Glucose, Bld: 122 mg/dL — ABNORMAL HIGH (ref 70–99)
HCT: 40 % (ref 36.0–46.0)
Hemoglobin: 13.6 g/dL (ref 12.0–15.0)
Potassium: 3 mmol/L — ABNORMAL LOW (ref 3.5–5.1)
Sodium: 141 mmol/L (ref 135–145)
TCO2: 23 mmol/L (ref 22–32)

## 2024-01-04 LAB — COMPREHENSIVE METABOLIC PANEL WITH GFR
ALT: 10 U/L (ref 0–44)
AST: 16 U/L (ref 15–41)
Albumin: 4.8 g/dL (ref 3.5–5.0)
Alkaline Phosphatase: 85 U/L (ref 38–126)
Anion gap: 16 — ABNORMAL HIGH (ref 5–15)
BUN: 13 mg/dL (ref 6–20)
CO2: 24 mmol/L (ref 22–32)
Calcium: 9.6 mg/dL (ref 8.9–10.3)
Chloride: 101 mmol/L (ref 98–111)
Creatinine, Ser: 1.04 mg/dL — ABNORMAL HIGH (ref 0.44–1.00)
GFR, Estimated: >60 mL/min (ref >60)
Glucose, Bld: 121 mg/dL — ABNORMAL HIGH (ref 70–99)
Potassium: 3.1 mmol/L — ABNORMAL LOW (ref 3.5–5.1)
Sodium: 141 mmol/L (ref 135–145)
Total Bilirubin: 0.2 mg/dL (ref 0.0–1.2)
Total Protein: 7.4 g/dL (ref 6.5–8.1)

## 2024-01-04 LAB — DIFFERENTIAL
Abs Immature Granulocytes: 0.02 K/uL (ref 0.00–0.07)
Basophils Absolute: 0.1 K/uL (ref 0.0–0.1)
Basophils Relative: 1 %
Eosinophils Absolute: 0.1 K/uL (ref 0.0–0.5)
Eosinophils Relative: 1 %
Immature Granulocytes: 0 %
Lymphocytes Relative: 33 %
Lymphs Abs: 3.8 K/uL (ref 0.7–4.0)
Monocytes Absolute: 0.6 K/uL (ref 0.1–1.0)
Monocytes Relative: 6 %
Neutro Abs: 6.8 K/uL (ref 1.7–7.7)
Neutrophils Relative %: 59 %

## 2024-01-04 LAB — TROPONIN T, HIGH SENSITIVITY
Troponin T High Sensitivity: <15 ng/L (ref 0–19)
Troponin T High Sensitivity: <15 ng/L (ref 0–19)

## 2024-01-04 LAB — CBG MONITORING, ED: Glucose-Capillary: 118 mg/dL — ABNORMAL HIGH (ref 70–99)

## 2024-01-04 LAB — CBC
HCT: 40.8 % (ref 36.0–46.0)
Hemoglobin: 13.6 g/dL (ref 12.0–15.0)
MCH: 28.7 pg (ref 26.0–34.0)
MCHC: 33.3 g/dL (ref 30.0–36.0)
MCV: 86.1 fL (ref 80.0–100.0)
Platelets: 350 K/uL (ref 150–400)
RBC: 4.74 MIL/uL (ref 3.87–5.11)
RDW: 13.6 % (ref 11.5–15.5)
WBC: 11.4 K/uL — ABNORMAL HIGH (ref 4.0–10.5)
nRBC: 0 % (ref 0.0–0.2)

## 2024-01-04 LAB — ETHANOL: Alcohol, Ethyl (B): <15 mg/dL (ref <15)

## 2024-01-04 LAB — PROTIME-INR
INR: 0.9 (ref 0.8–1.2)
Prothrombin Time: 12.6 s (ref 11.4–15.2)

## 2024-01-04 LAB — MAGNESIUM: Magnesium: 2.1 mg/dL (ref 1.7–2.4)

## 2024-01-04 MED ORDER — LACTATED RINGERS IV BOLUS
500.0000 mL | Freq: Once | INTRAVENOUS | Status: AC
  Administered 2024-01-04: 500 mL via INTRAVENOUS

## 2024-01-04 MED ORDER — DIPHENHYDRAMINE HCL 50 MG/ML IJ SOLN
12.5000 mg | Freq: Once | INTRAMUSCULAR | Status: AC
  Administered 2024-01-04: 12.5 mg via INTRAVENOUS
  Filled 2024-01-04: qty 1

## 2024-01-04 MED ORDER — POTASSIUM CHLORIDE 20 MEQ PO PACK
40.0000 meq | PACK | Freq: Once | ORAL | Status: AC
  Administered 2024-01-04: 40 meq via ORAL
  Filled 2024-01-04: qty 2

## 2024-01-04 MED ORDER — KETOROLAC TROMETHAMINE 15 MG/ML IJ SOLN
15.0000 mg | Freq: Once | INTRAMUSCULAR | Status: AC
  Administered 2024-01-04: 15 mg via INTRAVENOUS
  Filled 2024-01-04: qty 1

## 2024-01-04 MED ORDER — IOHEXOL 350 MG/ML SOLN
75.0000 mL | Freq: Once | INTRAVENOUS | Status: AC | PRN
  Administered 2024-01-04: 75 mL via INTRAVENOUS

## 2024-01-04 MED ORDER — METOCLOPRAMIDE HCL 5 MG/ML IJ SOLN
10.0000 mg | Freq: Once | INTRAMUSCULAR | Status: AC
  Administered 2024-01-04: 10 mg via INTRAVENOUS
  Filled 2024-01-04: qty 2

## 2024-01-04 NOTE — ED Triage Notes (Signed)
 Pt was found in car at gas station, she c/o headache on and off all day. Pt has left facial droop, slurred speech. Pt states tingling of left shoulder and arm started around 4 pm. Ems states pt  bp 173/122 and has been off blood pressure meds for about a week.

## 2024-01-04 NOTE — Discharge Instructions (Addendum)
 Your test results today are reassuring.  Call the telephone number below to follow-up with neurology.  Return to the emergency department for any new or worsening symptoms of concern.

## 2024-01-04 NOTE — ED Provider Notes (Signed)
 [facility] AT [facility] Provider Note   CSN: [id number] Arrival date & time: 01/04/24  1626  An emergency department physician performed an initial assessment on this suspected stroke patient at 1630.  Patient presents with: Code Stroke   Leslie Lowery is a 53 y.o. female.   HPI Patient presents for leg symptoms.  Medical history includes COPD, Crohn's disease, HTN, HLD, anxiety, depression.  At 4:00, patient was sitting in her car, waiting for her significant other.  She experienced headache, dizziness, left hemibody numbness and tingling.  She called her family member who encouraged her to call EMS due to possible stroke.  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  She is not currently on a blood thinner.  EMS reports that she had a brief episode of unresponsiveness when they arrived on scene.  This lasted for less than 1 minutes.  She was able to ambulate to stretcher.  EMS noted tachycardia and hypertension prior to arrival.     Prior to Admission medications   Medication Sig Start Date End Date Taking? Authorizing Provider  ALPRAZolam  (XANAX ) 0.5 MG tablet Take 1 tablet (0.5 mg total) by mouth at bedtime as needed for anxiety or sleep. LAST REFILL. 12/07/23  Yes Tobie Suzzane POUR, MD  amphetamine -dextroamphetamine  (ADDERALL) 20 MG tablet Take 1 tablet (20 mg total) by mouth daily. 12/09/23  Yes Tobie Suzzane POUR, MD  Baclofen 5 MG TABS Take 1 tablet by mouth 2 (two) times daily as needed. 12/08/23  Yes [provider]  gabapentin (NEURONTIN) 100 MG capsule Take 100 mg by mouth 3 (three) times daily. 12/08/23  Yes [provider]  Oxycodone  HCl 10 MG TABS Take 10 mg by mouth every 6 (six) hours as needed. 12/08/23  Yes [provider]  valACYclovir  (VALTREX ) 500 MG tablet Take 500 mg by mouth daily. 10/29/23  Yes [provider]  albuterol  (VENTOLIN  HFA) 108 (90 Base) MCG/ACT inhaler Inhale 1-2 puffs into the lungs every 6  (six) hours as needed for wheezing or shortness of breath.    [provider]  aspirin  EC 81 MG tablet Take 81 mg by mouth daily. Swallow whole.    [provider]  citalopram  (CELEXA ) 20 MG tablet Take 1 tablet (20 mg total) by mouth daily. 02/25/23   Tobie Suzzane POUR, MD  losartan  (COZAAR ) 25 MG tablet Take 1 tablet (25 mg total) by mouth daily. 02/25/23   Tobie Suzzane POUR, MD  oxyCODONE  (OXY IR/ROXICODONE ) 5 MG immediate release tablet Take 1 tablet (5 mg total) by mouth every 6 (six) hours as needed for severe pain (pain score 7-10). 11/09/23   Tobie Suzzane POUR, MD  rosuvastatin  (CRESTOR ) 10 MG tablet Take 1 tablet (10 mg total) by mouth daily. 02/25/23   Patel, Rutwik K, MD  Tiotropium Bromide-Olodaterol (STIOLTO RESPIMAT ) 2.5-2.5 MCG/ACT AERS Inhale 2 puffs into the lungs daily. 01/29/22   Tobie Suzzane POUR, MD  vitamin B-12 (CYANOCOBALAMIN ) 500 MCG tablet Take 1 tablet (500 mcg total) by mouth daily. 03/23/19   Evonnie Lenis, MD    Allergies: Patient has no known allergies.    Review of Systems  Neurological:  Positive for dizziness, facial asymmetry, speech difficulty, numbness and headaches.  All other systems reviewed and are negative.   Updated Vital Signs BP 124/75   Pulse 96   Temp 98.9 F (37.2 C) (Oral)   Resp 16   Ht 5' 3 (1.6 m)   Wt 96.3 kg   LMP 11/01/2008 (  Approximate)   SpO2 96%   BMI 37.61 kg/m   Physical Exam Vitals and nursing note reviewed.  Constitutional:      General: She is not in acute distress.    Appearance: Normal appearance. She is well-developed. She is not toxic-appearing or diaphoretic.  HENT:     Head: Normocephalic and atraumatic.     Right Ear: External ear normal.     Left Ear: External ear normal.     Nose: Nose normal.     Mouth/Throat:     Mouth: Mucous membranes are moist.  Eyes:     Extraocular Movements: Extraocular movements intact.     Conjunctiva/sclera: Conjunctivae normal.  Cardiovascular:     Rate and Rhythm:  Normal rate and regular rhythm.  Pulmonary:     Effort: Pulmonary effort is normal. No respiratory distress.  Abdominal:     General: There is no distension.     Palpations: Abdomen is soft.  Musculoskeletal:        General: No swelling or deformity.     Cervical back: Normal range of motion and neck supple.  Skin:    General: Skin is warm and dry.     Coloration: Skin is not jaundiced or pale.  Neurological:     Mental Status: She is alert and oriented to person, place, and time.     Cranial Nerves: Dysarthria and facial asymmetry present.     Sensory: Sensory deficit (Diminished sensation on left hemibody) present.     Motor: Weakness (Global) present.     Coordination: Finger-Nose-Finger Test abnormal (Bilaterally).  Psychiatric:        Mood and Affect: Mood normal.        Behavior: Behavior normal.     (all labs ordered are listed, but only abnormal results are displayed) Labs Reviewed  CBC - Abnormal; Notable for the following components:      Result Value   WBC 11.4 (*)    All other components within normal limits  COMPREHENSIVE METABOLIC PANEL WITH GFR - Abnormal; Notable for the following components:   Potassium 3.1 (*)    Glucose, Bld 121 (*)    Creatinine, Ser 1.04 (*)    Anion gap 16 (*)    All other components within normal limits  I-STAT CHEM 8, ED - Abnormal; Notable for the following components:   Potassium 3.0 (*)    Glucose, Bld 122 (*)    Calcium , Ion 1.12 (*)    All other components within normal limits  CBG MONITORING, ED - Abnormal; Notable for the following components:   Glucose-Capillary 118 (*)    All other components within normal limits  PROTIME-INR  DIFFERENTIAL  ETHANOL  MAGNESIUM  URINE DRUG SCREEN  TROPONIN T, HIGH SENSITIVITY  TROPONIN T, HIGH SENSITIVITY    EKG: EKG Interpretation Date/Time:  Monday January 04 2024 16:52:49 EDT Ventricular Rate:  112 PR Interval:  170 QRS Duration:  115 QT Interval:  354 QTC  Calculation: 484 R Axis:   59  Text Interpretation: Sinus tachycardia Right atrial enlargement Right bundle branch block Confirmed by Melvenia Motto ([id number]) on 01/04/2024 5:22:17 PM  Radiology: MR BRAIN WO CONTRAST Result Date: 01/04/2024 EXAM: MRI BRAIN WITHOUT CONTRAST 01/04/2024 06:13:19 PM TECHNIQUE: Multiplanar multisequence MRI of the head/brain was performed without the administration of intravenous contrast. COMPARISON: 03/21/2019 CLINICAL HISTORY: Neuro deficit, acute, stroke suspected. FINDINGS: BRAIN AND VENTRICLES: Minimal nonspecific white matter T2-weighted signal hyperintensities, which may be associated with early chronic small vessel disease or  migraine headaches. No acute infarct, intracranial hemorrhage, mass, midline shift, or hydrocephalus. The sella is unremarkable. Normal flow voids. ORBITS: No acute abnormality. SINUSES AND MASTOIDS: Small amount of right mastoid fluid. BONES AND SOFT TISSUES: Normal marrow signal. No acute soft tissue abnormality. IMPRESSION: 1. No acute intracranial abnormality. 2. Minimal nonspecific white matter T2 hyperintensities, which may reflect early chronic small vessel disease or migraine-related change. 3. Small amount of right mastoid fluid. Electronically signed by: Franky Stanford MD 01/04/2024 07:28 PM EDT RP Workstation: [id number]   CT ANGIO HEAD NECK W WO CM (CODE STROKE) Result Date: 01/04/2024 CLINICAL DATA:  Neuro deficit, acute, stroke suspected. Left facial droop and slurred speech. Headache. EXAM: CT ANGIOGRAPHY HEAD AND NECK WITH AND WITHOUT CONTRAST TECHNIQUE: Multidetector CT imaging of the head and neck was performed using the standard protocol during bolus administration of intravenous contrast. Multiplanar CT image reconstructions and MIPs were obtained to evaluate the vascular anatomy. Carotid stenosis measurements (when applicable) are obtained utilizing NASCET criteria, using the distal internal carotid diameter as the denominator.  RADIATION DOSE REDUCTION: This exam was performed according to the departmental dose-optimization program which includes automated exposure control, adjustment of the mA and/or kV according to patient size and/or use of iterative reconstruction technique. CONTRAST:  75mL OMNIPAQUE  IOHEXOL  350 MG/ML SOLN COMPARISON:  MRA head 03/21/2019. FINDINGS: CTA NECK FINDINGS Aortic arch: Normal variant 4 vessel aortic arch with the left vertebral artery arising directly from the arch. Mild atherosclerotic calcification. No significant stenosis of the arch vessel origins. Right carotid system: Patent without evidence of stenosis or dissection. Left carotid system: Patent without evidence of stenosis or dissection. Vertebral arteries: Patent without evidence of stenosis or dissection. Codominant. Skeleton: Mild cervical spondylosis. Other neck: No evidence of cervical lymphadenopathy or mass. Upper chest: Clear lung apices. Review of the MIP images confirms the above findings CTA HEAD FINDINGS Anterior circulation: The internal carotid arteries are patent from skull base to carotid termini with mild atherosclerosis not resulting in significant stenosis. ACAs and MCAs are patent without evidence of a proximal branch occlusion or significant proximal stenosis. No aneurysm is identified. Posterior circulation: The intracranial vertebral arteries are widely patent to the basilar. Patent bilateral PICA, left AICA, and bilateral SCA origins are visualized. The basilar artery is widely patent. There are small left and diminutive or absent right posterior communicating arteries. Both PCAs are patent without evidence of a significant proximal stenosis. No aneurysm is identified. Venous sinuses: Patent. Anatomic variants: None. Review of the MIP images confirms the above findings These results were communicated to Dr. Rosemarie at 4:49 p.m. on 01/04/2024 by text page via the [facility] messaging system. IMPRESSION: 1. Mild atherosclerosis in the  head and neck without a large vessel occlusion or significant proximal stenosis. 2.  Aortic Atherosclerosis (ICD10-I70.0). Electronically Signed   By: Dasie Hamburg M.D.   On: 01/04/2024 17:17   CT HEAD CODE STROKE WO CONTRAST Result Date: 01/04/2024 CLINICAL DATA:  Code stroke. Neuro deficit, acute, stroke suspected. Left facial droop and slurred speech. Headache. EXAM: CT HEAD WITHOUT CONTRAST TECHNIQUE: Contiguous axial images were obtained from the base of the skull through the vertex without intravenous contrast. RADIATION DOSE REDUCTION: This exam was performed according to the departmental dose-optimization program which includes automated exposure control, adjustment of the mA and/or kV according to patient size and/or use of iterative reconstruction technique. COMPARISON:  Head CT and MRI 03/21/2019 FINDINGS: Brain: There is no evidence of an acute infarct, intracranial hemorrhage, mass, midline shift, or  extra-axial fluid collection. Cerebral volume is normal. The ventricles are normal in size. Vascular: No hyperdense vessel. Skull: No fracture or suspicious lesion. Sinuses/Orbits: Visualized paranasal sinuses and mastoid air cells are clear. Unremarkable orbits. Other: None. ASPECTS (Alberta Stroke Program Early CT Score) - Ganglionic level infarction (caudate, lentiform nuclei, internal capsule, insula, M1-M3 cortex): 7 - Supraganglionic infarction (M4-M6 cortex): 3 Total score (0-10 with 10 being normal): 10 These results were communicated to Dr. Rosemarie at 4:49 pm on 01/04/2024 by text page via the [facility] messaging system. IMPRESSION: Negative head CT. ASPECTS of 10. Electronically Signed   By: Dasie Hamburg M.D.   On: 01/04/2024 16:50     Procedures   Medications Ordered in the ED  metoCLOPramide (REGLAN) injection 10 mg (10 mg Intravenous Given 01/04/24 1703)  diphenhydrAMINE  (BENADRYL ) injection 12.5 mg (12.5 mg Intravenous Given 01/04/24 1703)  lactated ringers  bolus 500 mL (0 mLs Intravenous  Stopped 01/04/24 1817)  iohexol  (OMNIPAQUE ) 350 MG/ML injection 75 mL (75 mLs Intravenous Contrast Given 01/04/24 1642)  potassium chloride  (KLOR-CON ) packet 40 mEq (40 mEq Oral Given 01/04/24 1718)  ketorolac  (TORADOL ) 15 MG/ML injection 15 mg (15 mg Intravenous Given 01/04/24 1720)                                    Medical Decision Making Amount and/or Complexity of Data Reviewed Labs: ordered. Radiology: ordered.  Risk Prescription drug management.   This patient presents to the ED for concern of strokelike symptoms, this involves an extensive number of treatment options, and is a complaint that carries with it a high risk of complications and morbidity.  The differential diagnosis includes CVA, TIA, seizure, neoplasm, metabolic derangements, polypharmacy   Co morbidities / Chronic conditions that complicate the patient evaluation  COPD, Crohn's disease, HTN, HLD, anxiety, depression   Additional history obtained:  Additional history obtained from EMR External records from outside source obtained and reviewed including EMS   Lab Tests:  I Ordered, and personally interpreted labs.  The pertinent results include: Slight leukocytosis, normal hemoglobin, hypokalemia with otherwise normal electrolytes   Imaging Studies ordered:  I ordered imaging studies including head, CTA head and neck, MRI brain I independently visualized and interpreted imaging which showed no acute findings I agree with the radiologist interpretation   Cardiac Monitoring: / EKG:  The patient was maintained on a cardiac monitor.  I personally viewed and interpreted the cardiac monitored which showed an underlying rhythm of: Sinus rhythm   Problem List / ED Course / Critical interventions / Medication management  Patient presents for strokelike symptoms.  Last known well was 30 minutes prior to arrival.  She reports onset of headache, dizziness, and left hemibody numbness and tingling.  EMS noted  hypertension and tachycardia prior to arrival.  On arrival, patient has low facial droop, slurred speech, diminished in station on left hemibody, global weakness and bilateral dysmetria.  Patient was taken to CT scanner.  She underwent noncontrasted CT of head as well as CTA of head and neck.  Results did not show any acute findings.  I spoke with neurologist on-call, Dr. Rosemarie, who recommends MRI.  If MRI is negative and patient's symptoms improved, she can be discharged from the ED. on reassessment, patient reports resolution of strokelike symptoms and significant improvement in headache.  She does request discharge home.  MRI did not show any acute findings.  Patient was discharged in stable  condition. I ordered medication including Reglan, Benadryl , Toradol  for headache; IV fluids for hydration, potassium chloride  for hypokalemia Reevaluation of the patient after these medicines showed that the patient improved I have reviewed the patients home medicines and have made adjustments as needed   Consultations Obtained:  I requested consultation with the neurologist, Dr. Rosemarie,  and discussed lab and imaging findings as well as pertinent plan - they recommend: Discharge if MRI negative and symptoms resolve   Social Determinants of Health:  Has PCP      Final diagnoses:  Stroke-like symptoms  Bad headache    ED Discharge Orders     None          Melvenia Motto, MD 01/04/24 1934

## 2024-01-04 NOTE — Progress Notes (Signed)
 [facility] Telemedicine Consult   Requesting Provider: Bernardino Fireman Consult Participants: patient and bedside RN Location of the provider: [facility] Location of the patient: [facility], ED  This consult was provided via telemedicine with 2-way video and audio communication. The patient/family was informed that care would be provided in this way and agreed to receive care in this manner.    Chief Complaint: slurred speech and left-sided weakness  HPI: 53 year old Caucasian lady with past medical history of hypertension, chronic pain syndrome secondary to lumbar spinal stenosis, MDD, ADHD, morbid obesity and tobacco abuse Hello non-turbid right number IS a palpable click not following morning Appointment brought in today as a code stroke for sudden onset of slurred speech and left-sided weakness while at a gas station at 4 PM today..  Patient stated that she had noticed some headache and dizziness earlier in the day but no trouble with speech and walking.  She states since arrival to the hospital she is feeling a little better speech is improved.  Upon arrival she was noted to have left facial droop and left-sided weakness by ER MD and nurse which also is improving.    LKW: ?  4 PM tnk given?: No, deficits too mild and improving IR Thrombectomy? No, no LVO noted Modified Rankin Scale: 0-Completely asymptomatic and back to baseline post- stroke Time of teleneurologist evaluation:  16 15  Exam: There were no vitals filed for this visit.  General: Middle-aged obese Caucasian lady not in distress.  She keeps her eyes closed. She is awake alert oriented to time place and person.  Speech appears slightly measured and slow but no dysarthria can be clearly understood.  Follows commands well.  Extraocular movements are full range without nystagmus.  Face appears symmetric.  She has poor effort when asked to smile but I do not see any obvious weakness on one side.  Tongue is  midline.  Motor system exam shows no upper or lower extremity drift but poor and consistent effort on the left.  Lower extremity exam shows no definite drift but again poor and consistent effort on the left.  Sensation diminished on the left hemibody including the forehead. Gait not tested 1A: Level of Consciousness - 0 1B: Ask Month and Age - 0 1C: 'Blink Eyes' & 'Squeeze Hands' - 0 2: Test Horizontal Extraocular Movements - 0 3: Test Visual Fields - 0 4: Test Facial Palsy - 0 5A: Test Left Arm Motor Drift - 0 poor effort 5B: Test Right Arm Motor Drift - 0 6A: Test Left Leg Motor Drift - 0 poor effort 6B: Test Right Leg Motor Drift - 0 7: Test Limb Ataxia - 0 8: Test Sensation - 1 9: Test Language/Aphasia- 0 10: Test Dysarthria - 0 11: Test Extinction/Inattention - 0 NIHSS score: 1    Imaging Reviewed:  CT head noncontrast study shows no acute abnormality.  Aspect score of 10. CT angiogram shows no LVO.  Labs reviewed in epic and pertinent values follow: Pending   Assessment: 53 year old Caucasian lady with sudden onset of dizziness, headache, left facial droop and left-sided weakness which appears to be improving.  Neurological exam shows only mild left hemisensory loss with poor and variable effort to strength testing on the left without definite drift. Differential includes right hemispheric TIA versus small infarct versus nonorganic etiology Recommendations: Patient aspirins at present time and thrombolysis but deficits appear quite mild and improving hence will hold off on thrombolysis.  Check MRI scan of the  brain.  We activate code stroke if patient has significant worsening of deficits.  If definite stroke is confirmed on the MRI may be admitted for further workup at [facility] and teleneurology to follow..  If patient's neurological deficits resolved completely and MRI is negative she may be discharged.   Long discussion with patient and answered questions.  Discussed  with Dr. Melvenia ER MD   This patient is receiving care for possible acute neurological changes. There was 54 minutes of care by this provider at the time of service, including time for direct evaluation via telemedicine, review of medical records, imaging studies and discussion of findings with providers, the patient and/or family.  Eather Popp MD [facility] [phone]  If 7pm- 7am, please page neurology on call as listed in AMION.

## 2024-01-04 NOTE — ED Notes (Addendum)
 Patient transported to MRI

## 2024-01-07 ENCOUNTER — Other Ambulatory Visit: Payer: Self-pay | Admitting: Internal Medicine

## 2024-01-07 DIAGNOSIS — F909 Attention-deficit hyperactivity disorder, unspecified type: Secondary | ICD-10-CM

## 2024-01-07 DIAGNOSIS — F410 Panic disorder [episodic paroxysmal anxiety] without agoraphobia: Secondary | ICD-10-CM

## 2024-01-07 MED ORDER — ALPRAZOLAM 0.5 MG PO TABS: 0.5000 mg | ORAL_TABLET | Freq: Every evening | ORAL | 0 refills | Status: AC | PRN

## 2024-01-07 MED ORDER — AMPHETAMINE-DEXTROAMPHETAMINE 20 MG PO TABS
20.0000 mg | ORAL_TABLET | Freq: Every day | ORAL | 0 refills | Status: DC
Start: 2024-01-07 — End: 2024-02-09

## 2024-01-07 NOTE — Telephone Encounter (Signed)
 Copied from CRM [id number]. Topic: Clinical - Medication Question >> Jan 07, 2024  2:00 PM Cleave MATSU wrote: Reason for CRM: pt said new dr asked if Dr patel can prescribe the adderall and xanax  one last time until her appt on the 16th with the mental health Dr

## 2024-01-12 DIAGNOSIS — Z79891 Long term (current) use of opiate analgesic: Secondary | ICD-10-CM | POA: Diagnosis not present

## 2024-01-12 DIAGNOSIS — G894 Chronic pain syndrome: Secondary | ICD-10-CM | POA: Diagnosis not present

## 2024-01-12 DIAGNOSIS — M5136 Other intervertebral disc degeneration, lumbar region with discogenic back pain only: Secondary | ICD-10-CM | POA: Diagnosis not present

## 2024-01-12 DIAGNOSIS — M545 Low back pain, unspecified: Secondary | ICD-10-CM | POA: Diagnosis not present

## 2024-01-23 DIAGNOSIS — G459 Transient cerebral ischemic attack, unspecified: Secondary | ICD-10-CM | POA: Diagnosis not present

## 2024-01-23 DIAGNOSIS — R29810 Facial weakness: Secondary | ICD-10-CM | POA: Diagnosis not present

## 2024-01-23 DIAGNOSIS — R569 Unspecified convulsions: Secondary | ICD-10-CM | POA: Diagnosis not present

## 2024-01-23 DIAGNOSIS — R4781 Slurred speech: Secondary | ICD-10-CM | POA: Diagnosis not present

## 2024-01-24 DIAGNOSIS — R569 Unspecified convulsions: Secondary | ICD-10-CM | POA: Diagnosis not present

## 2024-01-24 DIAGNOSIS — R2990 Unspecified symptoms and signs involving the nervous system: Secondary | ICD-10-CM | POA: Diagnosis not present

## 2024-01-25 NOTE — Discharge Summary (Signed)
 [facility]  [facility] Inpatient Discharge Summary  PCP: No Pcp Discharge Details   Admit date:         01/23/2024 Discharge date:        01/25/2024  Hospital Days:    2 days  Code Status:   Full Code Advanced Directives on file: No Directive        Discharge Diagnoses:  Principal Problem:   Psychogenic nonepileptic seizure   Task list for follow-up: Follow up with PCP post acute care  Unresulted Labs     Order Current Status   Vitamin B12 and Folate In process       Follow-Up Appointments Suggested: Ambulatory referral to Physical Therapy Evaluation and Treatment   Post acute care discharge  No Pcp [address] [place] [id number] [phone]     Ambulatory Referral to PCP   Post acute care discharge  Follow-Up Appointments Already Scheduled: No future appointments.  Discharge Medications:   Current Discharge Medication List     CONTINUE these medications which have NOT CHANGED      Details  ALPRAZolam  0.5 mg tablet Commonly known as: XANAX   Take one tablet (0.5 mg dose) by mouth daily.   amphetamine -dextroamphetamine  10 mg tablet Commonly known as: ADDERALL  Take one tablet (10 mg dose) by mouth 3 (three) times a day.   baclofen 10 mg tablet Commonly known as: LIORESAL  Take one tablet (10 mg dose) by mouth 2 (two) times daily.   gabapentin 100 mg capsule Commonly known as: NEURONTIN  Take one capsule (100 mg dose) by mouth 3 (three) times a day.   oxyCODONE  HCl 10 mg immediate release tablet Commonly known as: ROXICODONE   Take one tablet (10 mg dose) by mouth every 6 (six) hours.   valACYclovir  500 mg tablet Commonly known as: VALTREX   Take one tablet (500 mg dose) by mouth 2 (two) times daily.      * You might also be taking other medications not listed above. If you have questions about any of your other medications, talk to the person who prescribed them or your Primary Care Provider.            Allergies: Allergies[1]  Consultations this Admission: IP CONSULT TO CASE MANAGEMENT, RN/SW  Procedures/Imaging:     MRI Head WO W Contrast  Final Result  IMPRESSION: No acute intracranial abnormality.    Electronically Signed by: Leslie Holder, MD on 01/25/2024 8:27 AM    Discontinue EEG Monitoring  Final Result    EEG Longterm With Video  Final Result  EEG   ordered to establish a first diagnosis of a seizure disorder.    Medications: MAR was reviewed.    Start Time:  January 23, 2024 at 17:54   End Time:  January 24, 2024 at 15:30     Procedure: This is a 21 channel digital video EEG with a single channel   dedicated to limited EKG . The electrodes were placed in accordance with   the International 10-20 system. Various montages were used in the   acquisition of the EEG.          EEG Description: The posterior dominant rhythm consisted of a frequency of   10-11 Hz with an amplitude ranging between 20-30 V.  It was found to be   symmetric across the bilateral posterior derivations and attenuated with   eye opening.  The remaining background consisted of alpha and beta that   was well  organized, continuous, symmetric, and reactive.     Drowsiness was evidenced by alpha dropout, decreased muscle artifact, slow   roving eye movements and vertex waves.   Stage N2 sleep was evidenced by   sleep spindles and K-complexes.    There were no focal abnormalities, epileptiform abnormalities, or   electrographic seizures.          EEG Interpretation : This is a normal long term EEG.    Clinical Correlation : A normal EEG does not rule out epilepsy.     Electronically signed by: Leslie Blumenthal, MD  01/24/2024, 5:15 PM      CT Outside Images Head  Final Result    CT Outside Images Neck  Final Result    XR Chest Ap Portable  Final Result  IMPRESSION:  No acute cardiopulmonary abnormality.    Electronically Signed by: Leslie Fret, MD on 01/23/2024 2:44 PM     CT Code Stroke Head Neck Angio  Final Result  IMPRESSION:    1.  No acute arterial abnormality within the neck or head.  2.  No large vessel arterial occlusion or hemodynamically-significant stenosis.    Electronically Signed by: Leslie Lowery on 01/23/2024 2:35 PM    CT Head Perfusion  Final Result  IMPRESSION:     No evidence of cerebral perfusion abnormality.    Electronically Signed by: Leslie Lowery on 01/23/2024 2:27 PM    CT Code Stroke Head WO Contrast  Final Result  IMPRESSION:     No evidence of acute intracranial abnormality.    Electronically Signed by: Leslie Lowery on 01/23/2024 2:25 PM      Pertinent Labs:  Cardiac Labs: No results for input(s): CK, CKMB, CTNI, BNP in the last 168 hours. CBC: Recent Labs    Units 01/25/24 0418 01/24/24 0157  WBC thou/mcL 6.3 5.9  HGB gm/dL 88.7 88.7  PLT thou/mcL 244 251   BMP: Recent Labs    Units 01/25/24 0418 01/24/24 0157  NA mmol/L 142 141  K mmol/L 3.9 3.9  CL mmol/L 104 105  CO2 mmol/L 27 27  BUN mg/dL 8 7  CREATININE mg/dL 9.31 9.29  MAGNESIUM mg/dL 2.2 2.0   Lipid Panel: Recent Labs    Units 01/23/24 1552  CHOL mg/dL 832  TRIG mg/dL 888  HDL mg/dL 30*  LDL mg/dL 884*   Liver Enzymes: No results for input(s): INR, AST, ALT, ALKPHOS, BILITOT in the last 168 hours. Endocrine Panels: Recent Labs    Units 01/25/24 0418 01/24/24 0157 01/23/24 1725 01/23/24 1552 01/23/24 1502  TSH mcIU/mL  --   --   --  2.61  --   T4 ng/dL  --   --   --  8.67  --   HGBA1C %  --   --   --  5.1  --   GLUCOSE mg/dL 88 93 891*  --  87    Hospital Course   Physicians involved in care during this hospitalization Attending Provider: Herlene Prentice Amen, MD Attending Provider: Mabel Prentice Holding, MD Admitting Provider: Herlene Prentice Amen, MD Consulting Physician: Leslie Prentice Holding, MD  HPI per admitting provider: [name] is a 53 y.o. female with past medical  history of recent hospitalization at [facility] on 01/04/2024 due to TIA with left sided weakness and slurred speech, with negative MRI and no large vessel occlusion, history of seizures, chronic pain syndrome due to lumbar spinal stenosis, ADHD (in Adderall), MDD, morbid obesity, and tobacco abuse, who presented to  [facility] this morning with slurred speech, left facial droop, and left sided weakness. She was last known to be well at 06:00 this morning when her daughter left home for work. She called her daughter at 10:00 this morning and was altered with slurred speech. Her daughter in turn called EMS, who had to break into her house, and found her to have severely slurred speech. She was brought to the ED, where she was later found to have left hemiplegia. Tele Neurology was called and NIHSS was 9. Head CT at the outside hospital demonstrated no acute intracranial abnormality. She was not considered a candidate for IV thrombolytic therapy due to time of presentation. CTA head and neck showed no large vessel occlusion by report. While in the CT scanner, she had a 30 second tonic clonic seizure, and was given Ativan  1 mg IV. She apparently had another 30 second tonic clonic seizure in the ED, and was given another 2 mg Ativan  IV and was loaded with Keppra 1 gram IV. [facility] Neurology was called for higher level of care, and she was transferred to [facility] ED as a code stroke via [facility].   Patient is lying in bed on day of discharge. No neurological complaint at exam. No acute distress noted. Discuss discharge plans with patient. No family at bedside.   Hospital Course:     Nonepileptic seizures   MRI Brain unremarkable. cEEG: Normal   BP 135/79   Pulse 81   Temp 98.9 F (37.2 C)   Resp 17   Ht 1.6 m (5' 3)   Wt 100.7 kg (222 lb 1.6 oz)   SpO2 92%   BMI 39.34 kg/m   Physical Exam  General Physical Examination General: Lethargic HEENT: Normocephalic,  Atraumatic Cardiovascular: well perfused Lungs: On oxygen  supplementation  Abdomen: Non-tender, Non-distended  Skin: No rashes    Extremities: All extremities are equally antigravity       Neurologic Examination   Mental Status: Slightly drowsy but oriented to name, year and month (not date or month) able to follow basic commands   Cranial Nerves:  Pupils:  Visual Fields: (R) Full to confrontation; (L) Full to confrontation Optic Disc: Poorly visualized  CN III, IV, VI (Extra-Ocular Movements): EOMI CN V: Normal sensation in V1, V2, V3 bilaterally CN VII: Normal, symmetric facial muscle strength bilaterally CN VIII: Auditory acuity intact to bedside testing     Motor:  Strength: Equally antigravity in all 4 extremities Bulk:: Normal with no atrophy or fasciculations  Involuntary Movements (asterixis, tremor, etc): Absent  Pronator Drift: Absent   Sensory: Light Touch: Intact in all 4 extremities   Post Hospital Care   Activity:  Up Ad lib   Weight Bearing Status:    As tolerated      Oxygen  Orders for Discharge: O2 Device: None (Room air) SpO2: 92 %  Diet: Diet and Nourishment Orders (From admission, onward)     Start       01/23/24 1646  Regular Diet  Diet effective now                   Wound Care Recommendations: None   Lines/Drains/Airways: Patient Lines/Drains/Airways Status     Active LDAs     Name Placement date Placement time Site Days   Peripheral IV 18 G Anterior;Right Upper Arm 01/23/24  1405  Upper Arm  1   Urethral Catheter 01/23/24  --  --  2  Therapy Recommendations:  PT: Anticipated PT needs at discharge: Outpatient PT Anticipated Caregiver Needs at Discharge: Supervision for safety/cues DME Equipment Recommendations: Has all needed equipment     AM-PAC Basic Mobility Raw Score (out of 24): 24 Routine Mobility Goal: Walk 250 Feet or More & All ADLs in Bathroom After Gathering Supplies in Room 8  OT:           SLP: Anticipated SLP Needs at Discharge: No SLP needs anticipated     Nutritional Recommendation: Regular diet, Thin Liquids/0 Recommended Form of Meds: As tolerated    Home Health Orders: DME Orders (From admission, onward)    None      Home Health Agency     None       I spent 50 minutes performing discharge services.   Electronically signed: Nino DELENA Langdon, FNP 01/25/2024 / 12:26 PM       [1] No Known Allergies *Some images could not be shown.

## 2024-01-25 NOTE — Progress Notes (Signed)
 [facility] Case Management     Patient:   Leslie Lowery MR Number:  [id number] Patient Date of Birth: [birth year] Age/Sex:  53 y.o./female     Discussed in IDT rounds. Per provider, patient discharging home with daughter and OP PT.  Okay to cancel CM consult per provider.   1215 CM confirmed with patient that she has a PCP: Dr Tobie in [place].  Denies any needs for CM to address.      Electronically signed: Evert Repine, RN 01/25/2024 / 12:47 PM

## 2024-01-26 ENCOUNTER — Telehealth: Payer: Self-pay

## 2024-01-26 NOTE — Transitions of Care (Post Inpatient/ED Visit) (Signed)
 01/26/2024  Name: Leslie Lowery MRN: [id number] DOB: Sep 08, 1970  Today's TOC FU Call Status: Today's TOC FU Call Status:: Successful TOC FU Call Completed TOC FU Call Complete Date: 01/26/24 Patient's Name and Date of Birth confirmed.  Transition Care Management Follow-up Telephone Call Date of Discharge: 01/25/24 Discharge Facility: Other [facility]) Name of Other ([facility]) Discharge Facility: [facility] Type of Discharge: Inpatient Admission Primary Inpatient Discharge Diagnosis:: Seizures; possible stroke (TIA) How have you been since you were released from the hospital?: Better Any questions or concerns?: No  Items Reviewed: Did you receive and understand the discharge instructions provided?: Yes Medications obtained,verified, and reconciled?: Yes (Medications Reviewed) Any new allergies since your discharge?: No Dietary orders reviewed?: Yes Type of Diet Ordered:: Regular heart healthy Do you have support at home?: Yes People in Home [RPT]: alone, child(ren), adult Name of Support/Comfort Primary Source: Brittney  Medications Reviewed Today: Medications Reviewed Today     Reviewed by Eilleen Richerd GRADE, RN (Registered Nurse) on 01/26/24 at 1206  Med List Status: <None>   Medication Order Taking? Sig Documenting Provider Last Dose Status Informant  albuterol  (VENTOLIN  HFA) 108 (90 Base) MCG/ACT inhaler [id number] Yes Inhale 1-2 puffs into the lungs every 6 (six) hours as needed for wheezing or shortness of breath. [provider]  Active Self  ALPRAZolam  (XANAX ) 0.5 MG tablet [id number] Yes Take 1 tablet (0.5 mg total) by mouth at bedtime as needed for anxiety or sleep. LAST REFILL. Tobie Suzzane POUR, MD  Active   amphetamine -dextroamphetamine  (ADDERALL) 20 MG tablet [id number] Yes Take 1 tablet (20 mg total) by mouth daily. Tobie Suzzane POUR, MD  Active   aspirin  EC 81 MG tablet [id number] Yes Take 81 mg by mouth daily. Swallow whole. [provider]  Active   Baclofen 5 MG TABS [id number] Yes Take 1 tablet by mouth 2 (two) times daily as needed. [provider]  Active   citalopram  (CELEXA ) 20 MG tablet [id number] Yes Take 1 tablet (20 mg total) by mouth daily. Tobie Suzzane POUR, MD  Active   gabapentin (NEURONTIN) 100 MG capsule [id number] Yes Take 100 mg by mouth 3 (three) times daily. [provider]  Active   losartan  (COZAAR ) 25 MG tablet [id number] Yes Take 1 tablet (25 mg total) by mouth daily. Tobie Suzzane POUR, MD  Active   oxyCODONE  (OXY IR/ROXICODONE ) 5 MG immediate release tablet [id number]  Take 1 tablet (5 mg total) by mouth every 6 (six) hours as needed for severe pain (pain score 7-10).  Patient not taking: Reported on 01/26/2024   Tobie Suzzane POUR, MD  Active   Oxycodone  HCl 10 MG TABS [id number] Yes Take 10 mg by mouth every 6 (six) hours as needed. [provider]  Active   rosuvastatin  (CRESTOR ) 10 MG tablet [id number] Yes Take 1 tablet (10 mg total) by mouth daily. Tobie Suzzane POUR, MD  Active   Tiotropium Bromide-Olodaterol (STIOLTO RESPIMAT ) 2.5-2.5 MCG/ACT AERS [id number] Yes Inhale 2 puffs into the lungs daily. Tobie Suzzane POUR, MD  Active   valACYclovir  (VALTREX ) 500 MG tablet [id number] Yes Take 500 mg by mouth daily. [provider]  Active   vitamin B-12 (CYANOCOBALAMIN ) 500 MCG tablet [id number] Yes Take 1 tablet (500 mcg total) by mouth daily. Evonnie Lenis, MD  Active            Med Note LESLY, RICHERD GRADE Debar Jan 26, 2024 11:59 AM) Need to refill  Home Care and Equipment/Supplies: Were Home Health Services Ordered?: No Any new equipment or medical supplies ordered?: No  Functional Questionnaire: Do you need assistance with bathing/showering or dressing?: No Do you need assistance with meal preparation?: No Do you need assistance with eating?: No Do you have difficulty maintaining continence: No Do you need assistance with getting out of bed/getting out of a  chair/moving?: No Do you have difficulty managing or taking your medications?: No  Follow up appointments reviewed: PCP Follow-up appointment confirmed?: No (I will call because I am depending on my daughter's schedule) Do you need transportation to your follow-up appointment?: No Do you understand care options if your condition(s) worsen?: Yes-patient verbalized understanding  SDOH Interventions Today    Flowsheet Row Most Recent Value  SDOH Interventions   Food Insecurity Interventions Intervention Not Indicated  Housing Interventions Intervention Not Indicated  Transportation Interventions Intervention Not Indicated  Utilities Interventions Intervention Not Indicated, Statistician Referral  [FindHelp referral made]   Education for self-mgmt of Seizure/TIA provided during this outreach regarding: -s/s of worsening condition and when to seek medical attention -importance of completing all post discharge hospital hospital follow up appts -adherence to med regimen [facility] TOC 30-day program enrollment reviewed and discussed with pt/caregiver. They have declined enrollment in 30 day TOC program due to: I don't feel like I need anything right now.  Richerd Fish, RN, BSN, CCM [facility], [facility] Management Coordinator Direct Dial: [phone]

## 2024-02-07 ENCOUNTER — Emergency Department (EMERGENCY_DEPARTMENT_HOSPITAL)
Admission: EM | Admit: 2024-02-07 | Discharge: 2024-02-08 | Disposition: A | Discharge status: Other Acute Inpatient Hospital | Source: Home / Self Care | Attending: Emergency Medicine | Admitting: Emergency Medicine

## 2024-02-07 DIAGNOSIS — I69351 Hemiplegia and hemiparesis following cerebral infarction affecting right dominant side: Secondary | ICD-10-CM | POA: Insufficient documentation

## 2024-02-07 DIAGNOSIS — R4182 Altered mental status, unspecified: Secondary | ICD-10-CM

## 2024-02-07 DIAGNOSIS — J449 Chronic obstructive pulmonary disease, unspecified: Secondary | ICD-10-CM | POA: Insufficient documentation

## 2024-02-07 DIAGNOSIS — R202 Paresthesia of skin: Secondary | ICD-10-CM

## 2024-02-07 DIAGNOSIS — F17210 Nicotine dependence, cigarettes, uncomplicated: Secondary | ICD-10-CM | POA: Insufficient documentation

## 2024-02-07 DIAGNOSIS — R442 Other hallucinations: Secondary | ICD-10-CM

## 2024-02-07 DIAGNOSIS — Z7982 Long term (current) use of aspirin: Secondary | ICD-10-CM | POA: Insufficient documentation

## 2024-02-07 DIAGNOSIS — Z79899 Other long term (current) drug therapy: Secondary | ICD-10-CM | POA: Insufficient documentation

## 2024-02-07 DIAGNOSIS — F319 Bipolar disorder, unspecified: Secondary | ICD-10-CM | POA: Diagnosis not present

## 2024-02-07 DIAGNOSIS — Z8673 Personal history of transient ischemic attack (TIA), and cerebral infarction without residual deficits: Secondary | ICD-10-CM

## 2024-02-07 DIAGNOSIS — R519 Headache, unspecified: Secondary | ICD-10-CM

## 2024-02-07 DIAGNOSIS — G8191 Hemiplegia, unspecified affecting right dominant side: Secondary | ICD-10-CM

## 2024-02-07 DIAGNOSIS — I10 Essential (primary) hypertension: Secondary | ICD-10-CM | POA: Insufficient documentation

## 2024-02-07 DIAGNOSIS — F419 Anxiety disorder, unspecified: Secondary | ICD-10-CM | POA: Insufficient documentation

## 2024-02-07 LAB — COMPREHENSIVE METABOLIC PANEL WITH GFR
ALT: 10 U/L (ref 0–44)
AST: 16 U/L (ref 15–41)
Albumin: 4.8 g/dL (ref 3.5–5.0)
Alkaline Phosphatase: 96 U/L (ref 38–126)
Anion gap: 14 (ref 5–15)
BUN: 12 mg/dL (ref 6–20)
CO2: 27 mmol/L (ref 22–32)
Calcium: 9.9 mg/dL (ref 8.9–10.3)
Chloride: 100 mmol/L (ref 98–111)
Creatinine, Ser: 0.97 mg/dL (ref 0.44–1.00)
GFR, Estimated: >60 mL/min (ref >60)
Glucose, Bld: 117 mg/dL — ABNORMAL HIGH (ref 70–99)
Potassium: 3 mmol/L — ABNORMAL LOW (ref 3.5–5.1)
Sodium: 141 mmol/L (ref 135–145)
Total Bilirubin: 0.6 mg/dL (ref 0.0–1.2)
Total Protein: 7.7 g/dL (ref 6.5–8.1)

## 2024-02-07 LAB — CBC WITH DIFFERENTIAL/PLATELET
Abs Immature Granulocytes: 0.03 K/uL (ref 0.00–0.07)
Basophils Absolute: 0.1 K/uL (ref 0.0–0.1)
Basophils Relative: 1 %
Eosinophils Absolute: 0 K/uL (ref 0.0–0.5)
Eosinophils Relative: 0 %
HCT: 38.6 % (ref 36.0–46.0)
Hemoglobin: 13.2 g/dL (ref 12.0–15.0)
Immature Granulocytes: 0 %
Lymphocytes Relative: 20 %
Lymphs Abs: 2 K/uL (ref 0.7–4.0)
MCH: 28.8 pg (ref 26.0–34.0)
MCHC: 34.2 g/dL (ref 30.0–36.0)
MCV: 84.3 fL (ref 80.0–100.0)
Monocytes Absolute: 0.5 K/uL (ref 0.1–1.0)
Monocytes Relative: 5 %
Neutro Abs: 7.2 K/uL (ref 1.7–7.7)
Neutrophils Relative %: 74 %
Platelets: 348 K/uL (ref 150–400)
RBC: 4.58 MIL/uL (ref 3.87–5.11)
RDW: 13.2 % (ref 11.5–15.5)
WBC: 9.8 K/uL (ref 4.0–10.5)
nRBC: 0 % (ref 0.0–0.2)

## 2024-02-07 LAB — RESP PANEL BY RT-PCR (RSV, FLU A&B, COVID)  RVPGX2
Influenza A by PCR: NEGATIVE
Influenza B by PCR: NEGATIVE
Resp Syncytial Virus by PCR: NEGATIVE
SARS Coronavirus 2 by RT PCR: NEGATIVE

## 2024-02-07 LAB — HCG, SERUM, QUALITATIVE: Preg, Serum: NEGATIVE

## 2024-02-07 LAB — URINALYSIS, ROUTINE W REFLEX MICROSCOPIC
Bilirubin Urine: NEGATIVE
Glucose, UA: NEGATIVE mg/dL
Hgb urine dipstick: NEGATIVE
Ketones, ur: NEGATIVE mg/dL
Leukocytes,Ua: NEGATIVE
Nitrite: NEGATIVE
Protein, ur: 30 mg/dL — AB
Specific Gravity, Urine: 1.035 — ABNORMAL HIGH (ref 1.005–1.030)
pH: 5 (ref 5.0–8.0)

## 2024-02-07 LAB — ETHANOL: Alcohol, Ethyl (B): <15 mg/dL (ref <15)

## 2024-02-07 LAB — SALICYLATE LEVEL: Salicylate Lvl: <7 mg/dL — ABNORMAL LOW (ref 7.0–30.0)

## 2024-02-07 LAB — MAGNESIUM: Magnesium: 2.2 mg/dL (ref 1.7–2.4)

## 2024-02-07 LAB — ACETAMINOPHEN LEVEL: Acetaminophen (Tylenol), Serum: 23 ug/mL (ref 10–30)

## 2024-02-07 MED ORDER — LORAZEPAM 2 MG/ML IJ SOLN
1.0000 mg | Freq: Once | INTRAMUSCULAR | Status: AC
  Administered 2024-02-07: 1 mg via INTRAVENOUS
  Filled 2024-02-07: qty 1

## 2024-02-07 MED ORDER — ONDANSETRON HCL 4 MG PO TABS: 4.0000 mg | ORAL_TABLET | Freq: Three times a day (TID) | ORAL | Status: DC | PRN

## 2024-02-07 MED ORDER — ACETAMINOPHEN 325 MG PO TABS: 650.0000 mg | ORAL_TABLET | ORAL | Status: DC | PRN

## 2024-02-07 MED ORDER — PROCHLORPERAZINE EDISYLATE 10 MG/2ML IJ SOLN
10.0000 mg | Freq: Once | INTRAMUSCULAR | Status: AC
  Administered 2024-02-07: 10 mg via INTRAVENOUS
  Filled 2024-02-07: qty 2

## 2024-02-07 MED ORDER — KETOROLAC TROMETHAMINE 15 MG/ML IJ SOLN
15.0000 mg | Freq: Once | INTRAMUSCULAR | Status: AC
  Administered 2024-02-07: 15 mg via INTRAVENOUS
  Filled 2024-02-07: qty 1

## 2024-02-07 MED ORDER — POTASSIUM CHLORIDE CRYS ER 20 MEQ PO TBCR
40.0000 meq | EXTENDED_RELEASE_TABLET | Freq: Once | ORAL | Status: AC
  Administered 2024-02-07: 40 meq via ORAL
  Filled 2024-02-07: qty 2

## 2024-02-07 MED ORDER — DIPHENHYDRAMINE HCL 50 MG/ML IJ SOLN
25.0000 mg | Freq: Once | INTRAMUSCULAR | Status: AC
  Administered 2024-02-07: 25 mg via INTRAVENOUS
  Filled 2024-02-07: qty 1

## 2024-02-07 MED ORDER — ACETAMINOPHEN 500 MG PO TABS
1000.0000 mg | ORAL_TABLET | Freq: Once | ORAL | Status: AC
  Administered 2024-02-07: 1000 mg via ORAL
  Filled 2024-02-07: qty 2

## 2024-02-07 NOTE — BH Assessment (Signed)
 Patient was deferred to IRIS for a telepsych assessment. The assigned care coordinator will provide updates regarding the scheduling of the assessment. IRIS coordinator can be reached at [phone] for further information on the timing of the telepsych evaluation.
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
## 2024-02-07 NOTE — ED Provider Notes (Signed)
 [facility] EMERGENCY DEPARTMENT AT [facility] Provider Note   CSN: [id number] Arrival date & time: 02/07/24  1513     History  Chief Complaint  Patient presents with   Manic Behavior    Leslie Lowery is a 53 y.o. female with COPD, HTN, HLD, Crohn's disease, history of CVA with left-sided deficits, depression, severe anxiety/panic, and ADHD on adderall who presents with multiple complaints.   Patient was recently admitted at [facility] from 01/23/2024 to 01/25/2024 for transient left-sided weakness and altered mental status that improved, concern for a psychogenic nonepileptic seizure.  Had been originally transferred from OSH to [facility] for concern for possible LVO but on arrival her CTA and CTP were unremarkable.  No prior history of seizure.  Earlier this month had a similar episode of left arm weakness with a negative MRI.  Stroke/TIA appeared less likely by neurology.  Patient presents with complaint of continued symptoms, including frequent headaches, frequent episodes of lightheadedness and feeling very unwell, also with intermittent symptoms including left-sided tingling numbness and weakness, left facial droop and slurred speech, as well as a taste in her mouth and a phantom smell of ammonia.  The symptoms are very bothersome to her and she is very concerned about them.  She is very worried that someone in her life may be poisoning her, but she is afraid to say who.  She states she does feel safe at home but she is beginning to wonder.  She denies any physical or sexual violence.  She denies any SI, HI, AH, VH.  She denies any chest pain, shortness of breath, nausea vomiting, abdominal pain, urinary symptoms.  She does endorse some watery diarrhea though.  She does take Adderall at home but has had no recent changes in this dose.  She adamantly denies any alcohol or drug use.   Past Medical History:  Diagnosis Date   ADHD    Anxiety    COPD (chronic  obstructive pulmonary disease) (HCC)    Crohn disease (HCC)    Disc disease, degenerative, cervical    Emphysema lung (HCC)    High cholesterol    Hypertension        Home Medications Prior to Admission medications   Medication Sig Start Date End Date Taking? Authorizing Provider  albuterol  (VENTOLIN  HFA) 108 (90 Base) MCG/ACT inhaler Inhale 1-2 puffs into the lungs every 6 (six) hours as needed for wheezing or shortness of breath.    [provider]  ALPRAZolam  (XANAX ) 0.5 MG tablet Take 1 tablet (0.5 mg total) by mouth at bedtime as needed for anxiety or sleep. LAST REFILL. 01/07/24   Tobie Suzzane POUR, MD  amphetamine -dextroamphetamine  (ADDERALL) 20 MG tablet Take 1 tablet (20 mg total) by mouth daily. 01/07/24   Tobie Suzzane POUR, MD  aspirin  EC 81 MG tablet Take 81 mg by mouth daily. Swallow whole.    [provider]  Baclofen 5 MG TABS Take 1 tablet by mouth 2 (two) times daily as needed. 12/08/23   [provider]  citalopram  (CELEXA ) 20 MG tablet Take 1 tablet (20 mg total) by mouth daily. 02/25/23   Tobie Suzzane POUR, MD  gabapentin (NEURONTIN) 100 MG capsule Take 100 mg by mouth 3 (three) times daily. 12/08/23   [provider]  losartan  (COZAAR ) 25 MG tablet Take 1 tablet (25 mg total) by mouth daily. 02/25/23   Tobie Suzzane POUR, MD  oxyCODONE  (OXY IR/ROXICODONE ) 5 MG immediate release tablet Take 1 tablet (  5 mg total) by mouth every 6 (six) hours as needed for severe pain (pain score 7-10). Patient not taking: Reported on 01/26/2024 11/09/23   Tobie Suzzane POUR, MD  Oxycodone  HCl 10 MG TABS Take 10 mg by mouth every 6 (six) hours as needed. 12/08/23   [provider]  rosuvastatin  (CRESTOR ) 10 MG tablet Take 1 tablet (10 mg total) by mouth daily. 02/25/23   Patel, Rutwik K, MD  Tiotropium Bromide-Olodaterol (STIOLTO RESPIMAT ) 2.5-2.5 MCG/ACT AERS Inhale 2 puffs into the lungs daily. 01/29/22   Tobie Suzzane POUR, MD  valACYclovir  (VALTREX ) 500 MG tablet  Take 500 mg by mouth daily. 10/29/23   [provider]  vitamin B-12 (CYANOCOBALAMIN ) 500 MCG tablet Take 1 tablet (500 mcg total) by mouth daily. 03/23/19   Evonnie Lenis, MD      Allergies    Patient has no known allergies.    Review of Systems   Review of Systems A 10 point review of systems was performed and is negative unless otherwise reported in HPI.  Physical Exam Updated Vital Signs BP 112/76   Pulse 77   Temp 98.6 F (37 C)   Resp 13   Ht 5' 3 (1.6 m)   Wt 99.8 kg   LMP 11/01/2008 (Approximate)   SpO2 94%   BMI 38.97 kg/m  Physical Exam General: Normal appearing female, lying in bed.  HEENT: PERRLA, Sclera anicteric, MMM, trachea midline.  Cardiology: RRR, no murmurs/rubs/gallops. BL radial and DP pulses equal bilaterally.  Resp: Normal respiratory rate and effort. CTAB, no wheezes, rhonchi, crackles.  Abd: Soft, non-tender, non-distended. No rebound tenderness or guarding.  GU: Deferred. MSK: No peripheral edema or signs of trauma. Extremities without deformity or TTP. No cyanosis or clubbing. Skin: warm, dry.  Neuro: A&Ox4, CNs II-XII grossly intact. 5/5 strength all extremities. Sensation grossly intact. Normal speech.  Psych: Anxious and tearful affect. Goal oriented thoughts but very anxious.   ED Results / Procedures / Treatments   Labs (all labs ordered are listed, but only abnormal results are displayed) Labs Reviewed  COMPREHENSIVE METABOLIC PANEL WITH GFR - Abnormal; Notable for the following components:      Result Value   Potassium 3.0 (*)    Glucose, Bld 117 (*)    All other components within normal limits  SALICYLATE LEVEL - Abnormal; Notable for the following components:   Salicylate Lvl <7.0 (*)    All other components within normal limits  URINALYSIS, ROUTINE W REFLEX MICROSCOPIC - Abnormal; Notable for the following components:   Specific Gravity, Urine 1.035 (*)    Protein, ur 30 (*)    All other components within normal limits   RESP PANEL BY RT-PCR (RSV, FLU A&B, COVID)  RVPGX2  CBC WITH DIFFERENTIAL/PLATELET  MAGNESIUM  ETHANOL  ACETAMINOPHEN  LEVEL  HCG, SERUM, QUALITATIVE  CARBON MONOXIDE, BLOOD (PERFORMED AT REF LAB)  URINE DRUG SCREEN  HEAVY METALS, BLOOD  CBG MONITORING, ED    EKG EKG Interpretation Date/Time:  Sunday February 07 2024 15:34:39 EST Ventricular Rate:  109 PR Interval:  135 QRS Duration:  102 QT Interval:  331 QTC Calculation: 446 R Axis:   268  Text Interpretation: Sinus tachycardia LAD, consider left anterior fascicular block RSR' in V1 or V2, right VCD or RVH Baseline wander in lead(s) II III aVF Confirmed by Franklyn Gills [phone]) on 02/07/2024 4:11:13 PM  Radiology No results found.  Procedures Procedures    Medications Ordered in ED Medications  LORazepam  (ATIVAN ) injection 1 mg (  1 mg Intravenous Given 02/07/24 1616)  acetaminophen  (TYLENOL ) tablet 1,000 mg (1,000 mg Oral Given 02/07/24 1625)  diphenhydrAMINE  (BENADRYL ) injection 25 mg (25 mg Intravenous Given 02/07/24 1625)  prochlorperazine (COMPAZINE) injection 10 mg (10 mg Intravenous Given 02/07/24 1624)  ketorolac  (TORADOL ) 15 MG/ML injection 15 mg (15 mg Intravenous Given 02/07/24 1625)  potassium chloride  SA (KLOR-CON  M) CR tablet 40 mEq (40 mEq Oral Given 02/07/24 2032)    ED Course/ Medical Decision Making/ A&P                          Medical Decision Making Amount and/or Complexity of Data Reviewed Labs: ordered. Decision-making details documented in ED Course.  Risk OTC drugs. Prescription drug management.    This patient presents to the ED for concern of multiple complaints, this involves an extensive number of treatment options, and is a complaint that carries with it a high risk of complications and morbidity.  I considered the following differential and admission for this acute, potentially life threatening condition. Pt is in moderate distress w/ anxiety on interview. She is mildly tachycardic and  mildly hypertensive.  MDM:    With patient's constellation of sxs, consider migraine w/ aura, psychogenic somatic symptom disorder, seizure, sinonasal disease like sinusitis, or toxicologic exposure such as methamphetamines or carbon monoxide. She was recently evaluated and had negative brain MRI for any intracranial mass lesion. Patient is goal oriented in conversation; she is A&Ox4 and is not demonstrating symptoms of mania or psychosis but she is extremely anxious and tearful, possibly paranoid. Consider primary psychiatric illness as well such as severe anxiety/panic disorder. Will manage acute symptoms with 1 mg IV ativan  for now. Lower c/f TIA/CVA. Will also manage her headache with headache cocktail and reevaluate.  Clinical Course as of 02/07/24 2353  [place] Feb 07, 2024  1610 CBC with Differential/Platelet wnl [HN]  1629 Potassium(!): 3.0 Mild hypokalemia, similar to prior, can replete [HN]  1630 Magnesium: 2.2 wnl [HN]  1630 Alcohol, Ethyl (B): <15 [HN]  1630 Salicylate Lvl(!): <7.0 [HN]  1630 Acetaminophen  (Tylenol ), S: 23 [HN]  1941 Dr. Matthews will look over her information and call me back. [HN]  1945 Dr. Matthews recommends that patient follow-up with [facility] in the outpatient setting and does not recommend admission for spell characterization as she just had an admission for spell characterization.  She does agree with and acute psychiatric evaluation as well. [HN]  2350 Pending  CO level. Will follow up in AM. Patient has been evaluated by psychiatry and meets criteria for inpatient treatment. Patient is medically cleared for psychiatric disposition. Did place a referral for neurology in o/p setting.  [HN]    Clinical Course User Index [HN] Franklyn Sid SAILOR, MD    Labs: I Ordered, and personally interpreted labs.  The pertinent results include:  those listed above  Additional history obtained from chart review.  External records from outside source obtained and reviewed  including [facility]  Cardiac Monitoring: The patient was maintained on a cardiac monitor.  I personally viewed and interpreted the cardiac monitored which showed an underlying rhythm of: NSR  Reevaluation: After the interventions noted above, I reevaluated the patient and found that they have :improved  Social Determinants of Health: Lives independently  Disposition:  [facility]  Co morbidities that complicate the patient evaluation  Past Medical History:  Diagnosis Date   ADHD    Anxiety    COPD (chronic obstructive pulmonary disease) (HCC)  Crohn disease (HCC)    Disc disease, degenerative, cervical    Emphysema lung (HCC)    High cholesterol    Hypertension      Medicines Meds ordered this encounter  Medications   LORazepam  (ATIVAN ) injection 1 mg   acetaminophen  (TYLENOL ) tablet 1,000 mg   diphenhydrAMINE  (BENADRYL ) injection 25 mg   prochlorperazine (COMPAZINE) injection 10 mg   ketorolac  (TORADOL ) 15 MG/ML injection 15 mg   potassium chloride  SA (KLOR-CON  M) CR tablet 40 mEq    I have reviewed the patients home medicines and have made adjustments as needed  Problem List / ED Course: Problem List Items Addressed This Visit       Nervous and Auditory   Right hemiparesis (HCC)   Relevant Orders   Ambulatory referral to Neurology     Other   History of CVA (cerebrovascular accident)   Relevant Orders   Ambulatory referral to Neurology   Other Visit Diagnoses       Severe anxiety    -  Primary   Relevant Medications   LORazepam  (ATIVAN ) injection 1 mg (Completed)     Phantosmia       Relevant Orders   Ambulatory referral to Neurology     Acute nonintractable headache, unspecified headache type       Relevant Medications   acetaminophen  (TYLENOL ) tablet 1,000 mg (Completed)   ketorolac  (TORADOL ) 15 MG/ML injection 15 mg (Completed)   Other Relevant Orders   Ambulatory referral to Neurology     Paresthesias       Relevant Orders   Ambulatory  referral to Neurology                   This note was created using dictation software, which may contain spelling or grammatical errors.    Franklyn Sid SAILOR, MD 02/07/24 [phone]

## 2024-02-08 ENCOUNTER — Inpatient Hospital Stay (HOSPITAL_COMMUNITY)
Admission: AD | Admit: 2024-02-08 | Discharge: 2024-02-10 | DRG: 885 | Disposition: A | Discharge status: Home / Self Care | Source: Intra-hospital

## 2024-02-08 ENCOUNTER — Other Ambulatory Visit: Payer: Self-pay

## 2024-02-08 ENCOUNTER — Encounter (HOSPITAL_COMMUNITY): Payer: Self-pay | Admitting: Psychiatry

## 2024-02-08 DIAGNOSIS — F19951 Other psychoactive substance use, unspecified with psychoactive substance-induced psychotic disorder with hallucinations: Secondary | ICD-10-CM | POA: Diagnosis present

## 2024-02-08 DIAGNOSIS — R45851 Suicidal ideations: Secondary | ICD-10-CM | POA: Diagnosis present

## 2024-02-08 DIAGNOSIS — K0889 Other specified disorders of teeth and supporting structures: Secondary | ICD-10-CM | POA: Diagnosis present

## 2024-02-08 DIAGNOSIS — Z555 Less than a high school diploma: Secondary | ICD-10-CM

## 2024-02-08 DIAGNOSIS — E876 Hypokalemia: Secondary | ICD-10-CM | POA: Diagnosis present

## 2024-02-08 DIAGNOSIS — Z808 Family history of malignant neoplasm of other organs or systems: Secondary | ICD-10-CM

## 2024-02-08 DIAGNOSIS — F22 Delusional disorders: Secondary | ICD-10-CM | POA: Diagnosis present

## 2024-02-08 DIAGNOSIS — F331 Major depressive disorder, recurrent, moderate: Secondary | ICD-10-CM

## 2024-02-08 DIAGNOSIS — I69351 Hemiplegia and hemiparesis following cerebral infarction affecting right dominant side: Secondary | ICD-10-CM | POA: Diagnosis not present

## 2024-02-08 DIAGNOSIS — F411 Generalized anxiety disorder: Secondary | ICD-10-CM | POA: Diagnosis present

## 2024-02-08 DIAGNOSIS — Z9049 Acquired absence of other specified parts of digestive tract: Secondary | ICD-10-CM

## 2024-02-08 DIAGNOSIS — F909 Attention-deficit hyperactivity disorder, unspecified type: Secondary | ICD-10-CM | POA: Diagnosis present

## 2024-02-08 DIAGNOSIS — E782 Mixed hyperlipidemia: Secondary | ICD-10-CM | POA: Diagnosis present

## 2024-02-08 DIAGNOSIS — Z91148 Patient's other noncompliance with medication regimen for other reason: Secondary | ICD-10-CM

## 2024-02-08 DIAGNOSIS — M48062 Spinal stenosis, lumbar region with neurogenic claudication: Secondary | ICD-10-CM | POA: Diagnosis present

## 2024-02-08 DIAGNOSIS — F410 Panic disorder [episodic paroxysmal anxiety] without agoraphobia: Secondary | ICD-10-CM | POA: Diagnosis present

## 2024-02-08 DIAGNOSIS — Z8709 Personal history of other diseases of the respiratory system: Secondary | ICD-10-CM | POA: Diagnosis not present

## 2024-02-08 DIAGNOSIS — I10 Essential (primary) hypertension: Secondary | ICD-10-CM | POA: Diagnosis present

## 2024-02-08 DIAGNOSIS — F1999 Other psychoactive substance use, unspecified with unspecified psychoactive substance-induced disorder: Secondary | ICD-10-CM | POA: Diagnosis not present

## 2024-02-08 DIAGNOSIS — J439 Emphysema, unspecified: Secondary | ICD-10-CM | POA: Diagnosis present

## 2024-02-08 DIAGNOSIS — Z8249 Family history of ischemic heart disease and other diseases of the circulatory system: Secondary | ICD-10-CM

## 2024-02-08 DIAGNOSIS — Z560 Unemployment, unspecified: Secondary | ICD-10-CM

## 2024-02-08 DIAGNOSIS — Z7982 Long term (current) use of aspirin: Secondary | ICD-10-CM | POA: Diagnosis not present

## 2024-02-08 DIAGNOSIS — F319 Bipolar disorder, unspecified: Secondary | ICD-10-CM | POA: Diagnosis present

## 2024-02-08 DIAGNOSIS — F17210 Nicotine dependence, cigarettes, uncomplicated: Secondary | ICD-10-CM | POA: Diagnosis present

## 2024-02-08 DIAGNOSIS — G894 Chronic pain syndrome: Secondary | ICD-10-CM | POA: Diagnosis present

## 2024-02-08 DIAGNOSIS — Z833 Family history of diabetes mellitus: Secondary | ICD-10-CM

## 2024-02-08 DIAGNOSIS — Z79899 Other long term (current) drug therapy: Secondary | ICD-10-CM

## 2024-02-08 LAB — URINE DRUG SCREEN
Amphetamines: POSITIVE — AB
Barbiturates: NEGATIVE
Benzodiazepines: POSITIVE — AB
Cocaine: POSITIVE — AB
Fentanyl: NEGATIVE
Methadone Scn, Ur: NEGATIVE
Opiates: NEGATIVE
Tetrahydrocannabinol: POSITIVE — AB

## 2024-02-08 LAB — CARBON MONOXIDE, BLOOD (PERFORMED AT REF LAB): Carbon Monoxide, Blood: 10.7 % — ABNORMAL HIGH (ref 0.0–3.6)

## 2024-02-08 LAB — POTASSIUM: Potassium: 3.4 mmol/L — ABNORMAL LOW (ref 3.5–5.1)

## 2024-02-08 MED ORDER — HALOPERIDOL LACTATE 5 MG/ML IJ SOLN: 10.0000 mg | Freq: Three times a day (TID) | INTRAMUSCULAR | Status: DC | PRN

## 2024-02-08 MED ORDER — HALOPERIDOL 5 MG PO TABS: 5.0000 mg | ORAL_TABLET | Freq: Three times a day (TID) | ORAL | Status: DC | PRN

## 2024-02-08 MED ORDER — MAGNESIUM HYDROXIDE 400 MG/5ML PO SUSP: 30.0000 mL | Freq: Every day | ORAL | Status: DC | PRN

## 2024-02-08 MED ORDER — LORAZEPAM 2 MG/ML IJ SOLN: 2.0000 mg | Freq: Three times a day (TID) | INTRAMUSCULAR | Status: DC | PRN

## 2024-02-08 MED ORDER — OXYCODONE HCL 5 MG PO TABS
5.0000 mg | ORAL_TABLET | Freq: Four times a day (QID) | ORAL | Status: DC | PRN
  Administered 2024-02-08 (×3): 5 mg via ORAL
  Filled 2024-02-08 (×3): qty 1

## 2024-02-08 MED ORDER — HYDROXYZINE HCL 25 MG PO TABS: 25.0000 mg | ORAL_TABLET | Freq: Three times a day (TID) | ORAL | Status: DC | PRN

## 2024-02-08 MED ORDER — ALPRAZOLAM 0.5 MG PO TABS
0.5000 mg | ORAL_TABLET | Freq: Every evening | ORAL | Status: DC | PRN
Start: 2024-02-08 — End: 2024-02-08
  Administered 2024-02-08 (×2): 0.5 mg via ORAL
  Filled 2024-02-08 (×2): qty 1

## 2024-02-08 MED ORDER — CITALOPRAM HYDROBROMIDE 20 MG PO TABS
20.0000 mg | ORAL_TABLET | Freq: Every day | ORAL | Status: DC
  Administered 2024-02-08: 20 mg via ORAL
  Filled 2024-02-08: qty 1

## 2024-02-08 MED ORDER — TRAZODONE HCL 50 MG PO TABS: 50.0000 mg | ORAL_TABLET | Freq: Every evening | ORAL | Status: DC | PRN

## 2024-02-08 MED ORDER — NICOTINE 14 MG/24HR TD PT24
14.0000 mg | MEDICATED_PATCH | Freq: Every day | TRANSDERMAL | Status: DC
  Administered 2024-02-09 – 2024-02-10 (×2): 14 mg via TRANSDERMAL
  Filled 2024-02-08 (×2): qty 1

## 2024-02-08 MED ORDER — DIPHENHYDRAMINE HCL 50 MG/ML IJ SOLN: 50.0000 mg | Freq: Three times a day (TID) | INTRAMUSCULAR | Status: DC | PRN

## 2024-02-08 MED ORDER — HALOPERIDOL LACTATE 5 MG/ML IJ SOLN: 5.0000 mg | Freq: Three times a day (TID) | INTRAMUSCULAR | Status: DC | PRN

## 2024-02-08 MED ORDER — DIPHENHYDRAMINE HCL 25 MG PO CAPS: 50.0000 mg | ORAL_CAPSULE | Freq: Three times a day (TID) | ORAL | Status: DC | PRN

## 2024-02-08 MED ORDER — GABAPENTIN 100 MG PO CAPS
100.0000 mg | ORAL_CAPSULE | Freq: Three times a day (TID) | ORAL | Status: DC
  Administered 2024-02-08: 100 mg via ORAL
  Filled 2024-02-08: qty 1

## 2024-02-08 MED ORDER — ALUM & MAG HYDROXIDE-SIMETH 200-200-20 MG/5ML PO SUSP: 30.0000 mL | ORAL | Status: DC | PRN

## 2024-02-08 MED ORDER — ACETAMINOPHEN 325 MG PO TABS
650.0000 mg | ORAL_TABLET | Freq: Four times a day (QID) | ORAL | Status: DC | PRN
  Administered 2024-02-09 – 2024-02-10 (×3): 650 mg via ORAL
  Filled 2024-02-08 (×4): qty 2

## 2024-02-08 NOTE — ED Notes (Signed)
 Nurse was able to get the necklace, phone and pandora bracelet. Has also been put in locker 11.

## 2024-02-08 NOTE — Progress Notes (Signed)
 Admission Note:  53 yr female who presents IVC in no acute distress for the treatment of bizarre behavior, non-compliance of medications , insomnia and paranoia . Pt was on the unit at the beginning of shift , admission completed by clinical research associate. Pt stated she was here because I was having dizzy spells , I asked them if they can't find out what's wrong with me medically , can you see if someone put something in me. Pt stated she has DDD O need my pain medicine, I take my oxy at night  Per assessment: she is a 53 y.o. female who presents with complaints of symptoms concerning for mania. There is grave concern that patient is at high risk for morbidity and mortality given the severity of her thought disorder and inability to contract for safety. Patient's presentation is consistent with Rule out Substance induced psychotic disorder; Rule out unspecified schizophrenia spectrum and other psychotic disorder; Rule out unspecified bipolar and related disorder; Rule out delirium due to general medical condition. Therefore, patient does meet criteria for an intensive inpatient psychiatric hospitalization once medically cleared.  Collateral obtained from patient's daughter, Brittany (contact: [phone]).  Brittany reports that the patient has been mentally unstable over the past few weeks, exhibiting erratic behavior, increased irritability, and sleep disturbances. She believes that the patient's seizure activity may be contributing to her mental instability.  Brittany states that the patient has been calling her in the middle of the night, leaving voicemails alleging that her ex-boyfriend is poisoning her. The daughter reports that the patient has recently been spending time with this ex-boyfriend, whom she describes as a negative influence.  There are suspicions of possible illicit substance use, though the daughter is uncertain of the specifics. She also notes that the patient is not currently taking her  prescribed psychiatric medications, and is only using Adderall and Xanax  at this time.   Collateral and presentation are consistent with acute hypomania and possible substance-induced mood disturbance versus bipolar spectrum disorder exacerbation. The patient's noncompliance with prescribed medications, continued use of stimulants (Adderall) and benzodiazepines (Xanax ), and recent erratic and paranoid behaviors raise significant concern for psychiatric decompensation. Additionally, the allegations of being poisoned, although not fully delusional in structure, suggest paranoid ideation that may worsen without treatment.  Skin was assessed , Pt searched  (by prior shift) , POC and unit policies explained and understanding verbalized. Consents obtained. Pt had no additional questions or concerns.

## 2024-02-08 NOTE — ED Provider Notes (Signed)
 Collateral obtained from patient's daughter, Brittany (contact: [phone]).  Brittany reports that the patient has been mentally unstable over the past few weeks, exhibiting erratic behavior, increased irritability, and sleep disturbances. She believes that the patient's seizure activity may be contributing to her mental instability.  Brittany states that the patient has been calling her in the middle of the night, leaving voicemails alleging that her ex-boyfriend is poisoning her. The daughter reports that the patient has recently been spending time with this ex-boyfriend, whom she describes as a negative influence.  There are suspicions of possible illicit substance use, though the daughter is uncertain of the specifics. She also notes that the patient is not currently taking her prescribed psychiatric medications, and is only using Adderall and Xanax  at this time.  Collateral and presentation are consistent with acute hypomania and possible substance-induced mood disturbance versus bipolar spectrum disorder exacerbation. The patient's noncompliance with prescribed medications, continued use of stimulants (Adderall) and benzodiazepines (Xanax ), and recent erratic and paranoid behaviors raise significant concern for psychiatric decompensation. Additionally, the allegations of being poisoned, although not fully delusional in structure, suggest paranoid ideation that may worsen without treatment.

## 2024-02-08 NOTE — Progress Notes (Signed)
 Pt has been accepted to [facility] on 02/08/2024 Bed assignment: 302-02  Pt meets inpatient criteria per: Cathaleen Jacobson NP  Attending Physician will be: Dr. Prentis MD  Report can be called to: Adult unit: [phone]  Pt can arrive after pending IVC.   Care Team Notified: [facility] Cherylynn Ernst RN, Cathaleen Jacobson NP, Molinda Monte RN, Chesley Holt [facility]   [place] Kingsley Farace LCSW-A   02/08/2024 12:59 PM

## 2024-02-08 NOTE — ED Notes (Signed)
 Pt stating she's feeling better and has to take care of her family and her grandchildren. Pt States she wants to leave and she has too much to take care of to be her.   MD aware.

## 2024-02-08 NOTE — ED Notes (Signed)
 Pt wanded by security.

## 2024-02-08 NOTE — Group Note (Signed)
 Date:  02/08/2024 Time:  8:56 PM  Group Topic/Focus:  Wrap-Up Group:   The focus of this group is to help patients review their daily goal of treatment and discuss progress on daily workbooks.    Participation Level:  Did Not Attend  Participation Quality:  none  Affect:  n/a  Cognitive:  n/a  Insight: None  Engagement in Group:  None  Modes of Intervention:  none  Additional Comments:   Pt finishing admission during group  Alantra Popoca A Nahjae Hoeg 02/08/2024, 8:56 PM

## 2024-02-08 NOTE — Tx Team (Signed)
 Initial Treatment Plan 02/08/2024 8:55 PM [name] FMW:[id number]    PATIENT STRESSORS: Marital or family conflict   Medication change or noncompliance   Substance abuse     PATIENT STRENGTHS: General fund of knowledge  Motivation for treatment/growth    PATIENT IDENTIFIED PROBLEMS: Bizarre behavior  Medication non-compliance  insomnia  I want to go home               DISCHARGE CRITERIA:  Improved stabilization in mood, thinking, and/or behavior Verbal commitment to aftercare and medication compliance  PRELIMINARY DISCHARGE PLAN: Attend aftercare/continuing care group Attend PHP/IOP Outpatient therapy  PATIENT/FAMILY INVOLVEMENT: This treatment plan has been presented to and reviewed with the patient, Leslie Lowery  The patient and family have been given the opportunity to ask questions and make suggestions.  Orlando Ozell LABOR, RN 02/08/2024, 8:55 PM

## 2024-02-08 NOTE — ED Notes (Signed)
 Patient asked for a wash cloth to wash face, sitter gave the patient wash cloths and a set of paper scrubs. Patient changes into paper scrubs.

## 2024-02-08 NOTE — ED Provider Notes (Signed)
 Emergency Medicine Observation Re-evaluation Note  Leslie Lowery is a 53 y.o. female, seen on rounds today.  Pt initially presented to the ED for complaints of Manic Behavior Currently, the patient is awaiting psychiatric admission.  Physical Exam  BP 112/76   Pulse 77   Temp 98.6 F (37 C)   Resp 13   Ht 5' 3 (1.6 m)   Wt 99.8 kg   LMP 11/01/2008 (Approximate)   SpO2 94%   BMI 38.97 kg/m  Physical Exam Resting and in no acute distress  ED Course / MDM  EKG:EKG Interpretation Date/Time:  Sunday February 07 2024 15:34:39 EST Ventricular Rate:  109 PR Interval:  135 QRS Duration:  102 QT Interval:  331 QTC Calculation: 446 R Axis:   268  Text Interpretation: Sinus tachycardia LAD, consider left anterior fascicular block RSR' in V1 or V2, right VCD or RVH Baseline wander in lead(s) II III aVF Confirmed by Franklyn Gills [phone]) on 02/07/2024 4:11:13 PM  I have reviewed the labs performed to date as well as medications administered while in observation.  Recent changes in the last 24 hours include none   Plan  Current plan is for psychiatric admission.    Suzette Pac, MD 02/08/24 [phone]

## 2024-02-08 NOTE — ED Notes (Signed)
 Patient belongings put in locker 11. Includes keys, clothes shoes, and some of her jewelry. I notified the primary nurse that she would not hand over her pandora bracelet, necklace and her phone.

## 2024-02-08 NOTE — ED Notes (Signed)
 Pt repeatedly asking to go out to car and smoke. Pt repeatedly stating that she wants to go home and that she has no reason to be here. MD aware.

## 2024-02-09 DIAGNOSIS — Z8709 Personal history of other diseases of the respiratory system: Secondary | ICD-10-CM

## 2024-02-09 DIAGNOSIS — G894 Chronic pain syndrome: Secondary | ICD-10-CM

## 2024-02-09 DIAGNOSIS — F411 Generalized anxiety disorder: Secondary | ICD-10-CM

## 2024-02-09 DIAGNOSIS — I10 Essential (primary) hypertension: Secondary | ICD-10-CM

## 2024-02-09 DIAGNOSIS — F1999 Other psychoactive substance use, unspecified with unspecified psychoactive substance-induced disorder: Principal | ICD-10-CM

## 2024-02-09 LAB — URINE DRUG SCREEN
Amphetamines: POSITIVE — AB
Barbiturates: NEGATIVE
Benzodiazepines: POSITIVE — AB
Cocaine: POSITIVE — AB
Fentanyl: NEGATIVE
Methadone Scn, Ur: NEGATIVE
Opiates: NEGATIVE
Tetrahydrocannabinol: POSITIVE — AB

## 2024-02-09 MED ORDER — GABAPENTIN 300 MG PO CAPS
300.0000 mg | ORAL_CAPSULE | Freq: Three times a day (TID) | ORAL | Status: DC
  Administered 2024-02-09 – 2024-02-10 (×4): 300 mg via ORAL
  Filled 2024-02-09 (×4): qty 1

## 2024-02-09 MED ORDER — LOSARTAN POTASSIUM 25 MG PO TABS
25.0000 mg | ORAL_TABLET | Freq: Every day | ORAL | Status: DC
  Administered 2024-02-09 – 2024-02-10 (×2): 25 mg via ORAL
  Filled 2024-02-09 (×2): qty 1

## 2024-02-09 MED ORDER — ALBUTEROL SULFATE HFA 108 (90 BASE) MCG/ACT IN AERS: 1.0000 | INHALATION_SPRAY | Freq: Four times a day (QID) | RESPIRATORY_TRACT | Status: DC | PRN

## 2024-02-09 MED ORDER — BACLOFEN 10 MG PO TABS
10.0000 mg | ORAL_TABLET | Freq: Two times a day (BID) | ORAL | Status: DC
  Administered 2024-02-09 – 2024-02-10 (×3): 10 mg via ORAL
  Filled 2024-02-09 (×3): qty 1

## 2024-02-09 MED ORDER — HYDROXYZINE HCL 25 MG PO TABS
25.0000 mg | ORAL_TABLET | Freq: Three times a day (TID) | ORAL | Status: DC | PRN
  Administered 2024-02-09 – 2024-02-10 (×2): 25 mg via ORAL
  Filled 2024-02-09 (×2): qty 1

## 2024-02-09 MED ORDER — OXYCODONE HCL 5 MG PO TABS: 10.0000 mg | ORAL_TABLET | Freq: Four times a day (QID) | ORAL | Status: DC | PRN

## 2024-02-09 MED ORDER — ALPRAZOLAM 0.5 MG PO TABS
0.5000 mg | ORAL_TABLET | Freq: Every evening | ORAL | Status: DC | PRN
  Administered 2024-02-09: 0.5 mg via ORAL
  Filled 2024-02-09 (×2): qty 1

## 2024-02-09 MED ORDER — VALACYCLOVIR HCL 500 MG PO TABS
500.0000 mg | ORAL_TABLET | Freq: Every day | ORAL | Status: DC
  Administered 2024-02-09 – 2024-02-10 (×2): 500 mg via ORAL
  Filled 2024-02-09 (×2): qty 1

## 2024-02-09 MED ORDER — MIRTAZAPINE 15 MG PO TABS
15.0000 mg | ORAL_TABLET | Freq: Every day | ORAL | Status: AC
  Administered 2024-02-09: 15 mg via ORAL
  Filled 2024-02-09: qty 1

## 2024-02-09 MED ORDER — OXYCODONE HCL 5 MG PO TABS
10.0000 mg | ORAL_TABLET | Freq: Four times a day (QID) | ORAL | Status: DC | PRN
  Administered 2024-02-09 – 2024-02-10 (×5): 10 mg via ORAL
  Filled 2024-02-09 (×5): qty 2

## 2024-02-09 NOTE — [facility] Group Notes (Signed)
 Adult Psychoeducational Group Note  Date:  02/09/2024 Time:  9:38 PM  Group Topic/Focus:  Wrap-Up Group:   The focus of this group is to help patients review their daily goal of treatment and discuss progress on daily workbooks.  Participation Level:  Active  Participation Quality:  Attentive  Affect:  Appropriate  Cognitive:  Alert  Insight: Appropriate  Engagement in Group:  Engaged  Modes of Intervention:  Discussion  Additional Comments:  Patient attended and participated in the Wrap-up group.  Leslie Lowery 02/09/2024, 9:38 PM

## 2024-02-09 NOTE — Plan of Care (Signed)
   Problem: Education: Goal: Emotional status will improve Outcome: Progressing Goal: Mental status will improve Outcome: Progressing Goal: Verbalization of understanding the information provided will improve Outcome: Progressing
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
## 2024-02-09 NOTE — Progress Notes (Signed)
   02/09/24 0900  Psych Admission Type (Psych Patients Only)  Admission Status Involuntary  Psychosocial Assessment  Patient Complaints None  Eye Contact Fair  Facial Expression Flat;Sad  Affect Preoccupied  Speech Logical/coherent  Interaction Cautious  Motor Activity Slow  Appearance/Hygiene Disheveled  Behavior Characteristics Anxious;Restless  Mood Anxious;Preoccupied  Thought Process  Coherency Concrete thinking  Content WDL  Delusions WDL  Perception WDL  Hallucination None reported or observed  Judgment Poor  Confusion WDL  Danger to Self  Current suicidal ideation? Denies  Description of Suicide Plan No Plan  Agreement Not to Harm Self Yes  Description of Agreement Verbal  Danger to Others  Danger to Others None reported or observed

## 2024-02-09 NOTE — Group Note (Signed)
 Date:  02/09/2024 Time:  9:44 AM  Group Topic/Focus: Goals group  Patients participated in a goals-focused group aimed at increasing self-awareness, motivation, and future planning. As an research scientist (life sciences), patients were provided a worksheet listing 50 positive traits and asked to identify several that described themselves to promote positive self-concept. Patients then completed a goal-setting worksheet outlining short- and long-term goals (1 week, 1 month, 1 year, and 5 years). Group discussion centered on sharing individual goals, identifying potential obstacles, and exploring coping strategies and problem-solving methods to overcome barriers. Patients were encouraged to use realistic, measurable goals and to practice self-compassion in the goal-setting process.   Participation Level:  Did Not Attend  Participation Quality:  N/A  Affect:  N/A  Cognitive:  N/A  Insight: None  Engagement in Group:  None  Modes of Intervention:  N/A  Additional Comments:  Leslie Lowery did not attend goals group.  Kristi HERO Dakota Vanwart 02/09/2024, 9:44 AM

## 2024-02-09 NOTE — [facility] Group Notes (Signed)
 [place] did not attend the social work group today 02/09/24 ([id number]).

## 2024-02-09 NOTE — Progress Notes (Signed)
(  Sleep Hours) -8.75  (Any PRNs that were needed, meds refused, or side effects to meds)- hydroxyzine  25mg   (Any disturbances and when (visitation, over night)-none  (Concerns raised by the patient)- none  (SI/HI/AVH)-denies
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
## 2024-02-09 NOTE — H&P (Signed)
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 Njoku H, NP       losartan  (COZAAR ) tablet 25 mg  25 mg Oral Daily Varick Keys H, NP       magnesium hydroxide (MILK OF MAGNESIA) suspension 30 mL  30 mL Oral Daily PRN Motley-Mangrum, Jadeka A, PMHNP       mirtazapine (REMERON) tablet 15 mg  15 mg Oral QHS Quintavious Rinck H, NP       nicotine  (NICODERM CQ  - dosed in mg/24 hours) patch 14 mg  14 mg Transdermal Daily Onuoha, Chinwendu V, NP   14 mg at 02/09/24 0858   oxyCODONE  (Oxy IR/ROXICODONE ) immediate release tablet 10 mg  10 mg Oral Q6H PRN Bouchard, Marc A, DO       valACYclovir   (VALTREX ) tablet 500 mg  500 mg Oral Daily Rani Idler H, NP       PTA Medications: Medications Prior to Admission  Medication Sig Dispense Refill Last Dose/Taking   amphetamine -dextroamphetamine  (ADDERALL) 10 MG tablet Take 30 mg by mouth daily with breakfast. (Patient taking differently: Take 30 mg by mouth 2 (two) times daily with a meal.)   Taking Differently   valACYclovir  (VALTREX ) 500 MG tablet Take 500 mg by mouth daily.   Past Month   albuterol  (VENTOLIN  HFA) 108 (90 Base) MCG/ACT inhaler Inhale 1-2 puffs into the lungs every 6 (six) hours as needed for wheezing or shortness of breath.      ALPRAZolam  (XANAX ) 0.5 MG tablet Take 1 tablet (0.5 mg total) by mouth at bedtime as needed for anxiety or sleep. LAST REFILL. 15 tablet 0    baclofen (LIORESAL) 10 MG tablet Take  10 mg by mouth 2 (two) times daily.      citalopram  (CELEXA ) 20 MG tablet Take 1 tablet (20 mg total) by mouth daily. 90 tablet 1    gabapentin (NEURONTIN) 300 MG capsule Take 300 mg by mouth every 8 (eight) hours.      losartan  (COZAAR ) 25 MG tablet Take 1 tablet (25 mg total) by mouth daily. 90 tablet 1    oxyCODONE  (OXY IR/ROXICODONE ) 5 MG immediate release tablet Take 1 tablet (5 mg total) by mouth every 6 (six) hours as needed for severe pain (pain score 7-10). (Patient not taking: Reported on 01/26/2024) 120 tablet 0    Oxycodone  HCl 10 MG TABS Take 10 mg by mouth every 6 (six) hours as needed.      vitamin B-12 (CYANOCOBALAMIN ) 500 MCG tablet Take 1 tablet (500 mcg total) by mouth daily.       AIMS:  ,  ,  ,  ,  ,  ,    Musculoskeletal: Strength & Muscle Tone: within normal limits Gait & Station: normal Patient leans: N/A            Psychiatric Specialty Exam:  Presentation  General Appearance: Casual; Fairly Groomed  Eye Contact:Good  Speech:Clear and Coherent; Normal Rate  Speech Volume:Normal  Handedness:No data recorded  Mood and Affect  Mood:Euthymic  Affect:Appropriate;  Congruent   Thought Process  Thought Processes:Coherent; Linear  Duration of Psychotic Symptoms:Patient appears to have metabolized the psychoactive substance.  There is no current evidence of overt psychotic symptoms. Past Diagnosis of Schizophrenia or Psychoactive disorder: No data recorded Descriptions of Associations:No data recorded Orientation:Full (Time, Place and Person)  Thought Content:Logical  Hallucinations:Hallucinations: None  Ideas of Reference:None  Suicidal Thoughts:Suicidal Thoughts: No  Homicidal Thoughts:Homicidal Thoughts: No   Sensorium  Memory:Immediate Good; Recent Good  Judgment:Fair  Insight:Fair   Executive Functions  Concentration:Fair  Attention Span:Fair  Recall:Good  Fund of Knowledge:Good  Language:Good   Psychomotor Activity  Psychomotor Activity:Psychomotor Activity: Normal   Assets  Assets:Communication Skills; Desire for Improvement; Housing; Resilience; Social Support   Sleep  Sleep:Sleep: Fair  Estimated Sleeping Duration (Last 24 Hours): 6.25-6.75 hours (Due to Daylight Saving Time, the durations displayed may not accurately represent documentation during the time change interval)   Physical Exam: Physical Exam Vitals and nursing note reviewed.  Constitutional:      General: She is not in acute distress.    Appearance: She is not ill-appearing.  HENT:     Mouth/Throat:     Pharynx: Oropharynx is clear.  Cardiovascular:     Rate and Rhythm: Normal rate.     Pulses: Normal pulses.  Pulmonary:     Effort: No respiratory distress.  Neurological:     Mental Status: She is alert and oriented to person, place, and time.    ROS Blood pressure (!) 161/89, pulse (!) 104, temperature 98 F (36.7 C), temperature source Oral, resp. rate 20, last menstrual period 11/01/2008, SpO2 98%. There is no height or weight on file to calculate BMI.  Treatment Plan Summary: Daily contact with patient to assess and evaluate  symptoms and progress in treatment and Medication management  Diagnoses / Active Problems:      Principal Problem:   Psychoactive substance-induced disorder (HCC) Active Problems:   Chronic pain syndrome   Essential hypertension   Generalized anxiety disorder   History of COPD          PLAN: Safety and Monitoring:             --  Involuntary (will not uphold) admission to inpatient psychiatric unit for safety, stabilization and treatment             -- Daily contact with patient to assess and evaluate symptoms and progress in treatment             -- Patient's case to be discussed in multi-disciplinary team meeting             -- Observation Level: q15 minute checks             -- Vital signs:  q12 hours             -- Precautions: suicide, elopement, and assault   2. Psychiatric Diagnoses and Treatment:  # Substance-induced mood disorder  GAD  -- Start mirtazapine 15 mg oral daily at bedtime for sleep, appetite -- Hydroxyzine  25 mg oral, 3 times daily as needed, anxiety -- Discontinue trazodone  -- Haldol BH Agitation Protocol (See MAR)               3. Medical Issues Being Addressed:        - Restart gabapentin 300 mg every 8 hours, neuropathy - Restart baclofen 10 mg oral 2 times daily, muscle spasm - Restart losartan  25 mg oral daily, hypertension - Restart Xanax  0.5 mg tablet oral daily at bedtime as needed, anxiety, sleep - Restart albuterol  inhaler 1 to 2 puffs every 6 hours as needed, COPD - Restart oxycodone  IR 10 mg oral every 6 hours as needed, chronic back pain - Restart Valtrex  500 mg oral daily, HSV - Hold Adderall   # Nicotine  Dependence  -- Nicotine  14 patch daily  -- Nicorette Gum 2 mg as needed              -- Smoking cessation encouraged   4. Labs  -- CBC: Unremarkable             -- CMP: Potassium 3.4             -- Ethanol: <15             -- Lipid Panel (01/23/2024): LDL 115, Cholesterol 6, HDL 30  -- Magnesium: 2.2             -- HgBA1c  (01/23/2024): 5.1  -- Heavy metals blood: In process  -- Serum pregnancy: Negative             -- UDS: + Cocaine, THC, Benzodiazepine (Rx), Amphetamine  (Rx)   -- UA: Specific gravity 1.035, Protein 30, otherwise unremarkable             -- EKG: QT/QTc    -- The risks/benefits/side-effects/alternatives to this medication were discussed in detail with the patient and time was given for questions. The patient consents to medication trial.  -- FDA -- Metabolic profile and EKG monitoring obtained while on an atypical antipsychotic (BMI: Lipid Panel: HbgA1c: QTc:)               -- Encouraged patient to participate in unit milieu and in scheduled group therapies  -- Short Term Goals: Ability to identify changes in lifestyle to reduce recurrence of condition will improve, Ability to verbalize feelings will improve, Ability to disclose and discuss suicidal ideas, Ability to demonstrate self-control will improve, Ability to identify and develop effective coping behaviors will improve, Ability to maintain clinical measurements within normal limits will improve, Compliance with prescribed medications will improve, and Ability to identify triggers associated with substance abuse/mental health issues will  improve             -- Long Term Goals: Improvement in symptoms so as ready for discharge     5. Discharge Planning:  -- Social work and case management to assist with discharge planning and identification of hospital follow-up needs prior to discharge -- Estimated LOS: 5-7 days -- Discharge Concerns: Need to establish a safety plan; Medication compliance and effectiveness -- Discharge Goals: Return home with outpatient referrals for mental health follow-up including medication management/psychotherapy    Physician Treatment Plan for Primary Diagnosis: Psychoactive substance-induced disorder (HCC) Long Term Goal(s): Improvement in symptoms so as ready for discharge  Short Term Goals: Ability to identify  changes in lifestyle to reduce recurrence of condition will improve, Ability to verbalize feelings will improve, Ability to disclose and discuss suicidal ideas, Ability to demonstrate self-control will improve, Ability to identify and develop effective coping behaviors will improve, Ability to maintain clinical measurements within normal limits will improve, Compliance with prescribed medications will improve, and Ability to identify triggers associated with substance abuse/mental health issues will improve   I certify that inpatient services furnished can reasonably be expected to improve the patient's condition.    Blair Chiquita Hint, NP 11/11/202512:52 PM

## 2024-02-09 NOTE — Progress Notes (Signed)
(  Sleep Hours) -6.75 (Any PRNs that were needed, meds refused, or side effects to meds)- Tylenol  per [facility]  (Any disturbances and when (visitation, over night)-none  (Concerns raised by the patient)- I want my oxy  (SI/HI/AVH)- denies

## 2024-02-09 NOTE — Group Note (Signed)
 Date:  02/09/2024 Time:  4:51 PM  Group Topic/Focus:  Making Healthy Choices:   The focus of this group is to help patients identify negative/unhealthy choices they were using prior to admission and identify positive/healthier coping strategies to replace them upon discharge. Self Care:   The focus of this group is to help patients understand the importance of self-care in order to improve or restore emotional, physical, spiritual, interpersonal, and financial health.    Participation Level:  Did Not Attend  Leslie Lowery 02/09/2024, 4:51 PM

## 2024-02-09 NOTE — Plan of Care (Signed)
   Problem: Education: Goal: Emotional status will improve Outcome: Progressing   Problem: Activity: Goal: Sleeping patterns will improve Outcome: Progressing

## 2024-02-09 NOTE — Group Note (Signed)
 LCSW Group Therapy Note   Group Date: 02/09/2024 Start Time: 1100 End Time: 1200   Participation:  did not attend  Type of Therapy:  Group Therapy   Topic:  Understanding Your Path to Change  Objective:  The goal is to help individuals understand the stages of change, identify where they currently are in the process, and provide actionable next steps to continue moving forward in their journey of change.  Goals: - Learn about the six stages of change:  Precontemplation, Contemplation, Preparation, Action, Maintenance, and Relapse - Reflect on Current Change Efforts:  Recognize which stage participants are in regarding a personal change. - Plan Next Steps for Moving Forward:  Create an action plan based on their current stage of change.  Summary:  In this session, we explored the Stages of Change as a framework to understand the process of change.  We discussed how each stage helps individuals recognize where they are in their personal journey and used the Stages of Change Worksheet for self-reflection. Participants answered questions to better understand their current stage, challenges, and progress. We also emphasized the importance of moving forward, even if setbacks (Relapse) occur, and created actionable steps to help participants continue progressing. By the end of the session, participants gained a clearer understanding of their path to change and left with a clear plan for next steps.  Modalities:  Elements of CBT (cognitive restructuring, problem solving)  Element of DBT (mindfulness, distress tolerance)   Sharaya Boruff O Urian Martenson, LCSWA 02/09/2024  12:35 PM

## 2024-02-09 NOTE — [facility] Group Notes (Signed)
 Yaa did not attend the pet therapy group today 02/09/24 [phone]).

## 2024-02-09 NOTE — Group Note (Signed)
 Recreation Therapy Group Note   Group Topic:Animal Assisted Therapy   Group Date: 02/09/2024 Start Time: 0950 End Time: 1030 Facilitators: Kaelon Weekes-McCall, LRT,CTRS Location: [address] Dayroom   Animal-Assisted Activity (AAA) Program Checklist/Progress Notes Patient Eligibility Criteria Checklist & Daily Group note for Rec Tx Intervention  AAA/T Program Assumption of Risk Form signed by Patient/ or Parent Legal Guardian No  Behavioral Response:    Education: Hand Washing, Appropriate Animal Interaction   Education Outcome: Acknowledges education.    Affect/Mood: N/A   Participation Level: Did not attend    Clinical Observations/Individualized Feedback:      Plan: Continue to engage patient in RT group sessions 2-3x/week.   Jahni Paul-McCall, LRT,CTRS 02/09/2024 12:08 PM

## 2024-02-10 ENCOUNTER — Encounter (HOSPITAL_COMMUNITY): Payer: Self-pay

## 2024-02-10 MED ORDER — MIRTAZAPINE 15 MG PO TABS: 15.0000 mg | ORAL_TABLET | Freq: Every day | ORAL | 0 refills | Status: AC

## 2024-02-10 MED ORDER — CITALOPRAM HYDROBROMIDE 10 MG PO TABS: 10.0000 mg | ORAL_TABLET | Freq: Every day | ORAL | 0 refills | Status: AC

## 2024-02-10 MED ORDER — CITALOPRAM HYDROBROMIDE 10 MG PO TABS
10.0000 mg | ORAL_TABLET | Freq: Every day | ORAL | Status: DC
  Administered 2024-02-10: 10 mg via ORAL
  Filled 2024-02-10: qty 1

## 2024-02-10 MED ORDER — NICOTINE 14 MG/24HR TD PT24: 14.0000 mg | MEDICATED_PATCH | Freq: Every day | TRANSDERMAL | 0 refills | Status: AC

## 2024-02-10 MED ORDER — MIRTAZAPINE 15 MG PO TABS: 15.0000 mg | ORAL_TABLET | Freq: Every day | ORAL | Status: DC

## 2024-02-10 NOTE — Progress Notes (Addendum)
 Discharge Note:  Patient denies SI/HI/AVH at this time. Discharge instructions, AVS, prescriptions, and transition record gone over with patient. Patient agrees to adhere with medication management, follow-up visit, and outpatient therapy. Patient belongings returned to patient. Patient questions and concerns addressed and answered. Patient ambulatory off unit. Patient discharged to home via [employer].

## 2024-02-10 NOTE — Group Note (Signed)
 Date:  02/10/2024 Time:  11:42 AM  Group Topic/Focus:   Recreational Therapy: The focus of this group was to discuss leisure with a hands on activity meant to help patients explore their physical, emotional, cognitive and/or social wellbeing.  Participation Level:  Did Not Attend   Leslie Lowery Mars 02/10/2024, 11:42 AM
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
## 2024-02-10 NOTE — Plan of Care (Signed)
  Problem: Education: Goal: Emotional status will improve Outcome: Progressing Goal: Verbalization of understanding the information provided will improve Outcome: Progressing   Problem: Coping: Goal: Ability to verbalize frustrations and anger appropriately will improve Outcome: Progressing Goal: Ability to demonstrate self-control will improve Outcome: Progressing   Problem: Health Behavior/Discharge Planning: Goal: Compliance with treatment plan for underlying cause of condition will improve Outcome: Progressing

## 2024-02-10 NOTE — Group Note (Signed)
 Date:  02/10/2024 Time:  4:43 PM  Group Topic/Focus: 5 Love languages Healthy Communication:   The focus of this group is to discuss communication, barriers to communication, as well as healthy ways to communicate with others.    Participation Level:  Did Not Attend  Juliene CHRISTELLA Huddle 02/10/2024, 4:43 PM

## 2024-02-10 NOTE — Progress Notes (Signed)

## 2024-02-10 NOTE — BH IP Treatment Plan (Signed)
 Interdisciplinary Treatment and Diagnostic Plan Update  02/10/2024 Time of Session: 1050 AM Leslie Lowery MRN: [id number]  Principal Diagnosis: Psychoactive substance-induced disorder [facility])  Secondary Diagnoses: Principal Problem:   Psychoactive substance-induced disorder (HCC) Active Problems:   Chronic pain syndrome   Essential hypertension   Generalized anxiety disorder   History of COPD   Current Medications:  Current Facility-Administered Medications  Medication Dose Route Frequency Provider Last Rate Last Admin   acetaminophen  (TYLENOL ) tablet 650 mg  650 mg Oral Q6H PRN Motley-Mangrum, Jadeka A, PMHNP   650 mg at 02/09/24 0857   albuterol  (VENTOLIN  HFA) 108 (90 Base) MCG/ACT inhaler 1-2 puff  1-2 puff Inhalation Q6H PRN Bennett, Christal H, NP       ALPRAZolam  (XANAX ) tablet 0.5 mg  0.5 mg Oral QHS PRN Bennett, Christal H, NP   0.5 mg at 02/09/24 1505   alum & mag hydroxide-simeth (MAALOX/MYLANTA) 200-200-20 MG/5ML suspension 30 mL  30 mL Oral Q4H PRN Motley-Mangrum, Jadeka A, PMHNP       baclofen (LIORESAL) tablet 10 mg  10 mg Oral BID Bennett, Christal H, NP   10 mg at 02/10/24 0934   citalopram  (CELEXA ) tablet 10 mg  10 mg Oral Daily Bennett, Christal H, NP   10 mg at 02/10/24 1158   haloperidol (HALDOL) tablet 5 mg  5 mg Oral TID PRN Motley-Mangrum, Jadeka A, PMHNP       And   diphenhydrAMINE  (BENADRYL ) capsule 50 mg  50 mg Oral TID PRN Motley-Mangrum, Jadeka A, PMHNP       haloperidol lactate (HALDOL) injection 5 mg  5 mg Intramuscular TID PRN Motley-Mangrum, Jadeka A, PMHNP       And   diphenhydrAMINE  (BENADRYL ) injection 50 mg  50 mg Intramuscular TID PRN Motley-Mangrum, Jadeka A, PMHNP       haloperidol lactate (HALDOL) injection 10 mg  10 mg Intramuscular TID PRN Motley-Mangrum, Jadeka A, PMHNP       And   diphenhydrAMINE  (BENADRYL ) injection 50 mg  50 mg Intramuscular TID PRN Motley-Mangrum, Jadeka A, PMHNP       gabapentin (NEURONTIN) capsule 300 mg  300 mg  Oral Q8H Bennett, Christal H, NP   300 mg at 02/10/24 0542   hydrOXYzine  (ATARAX ) tablet 25 mg  25 mg Oral TID PRN Blair, Christal H, NP   25 mg at 02/10/24 1158   losartan  (COZAAR ) tablet 25 mg  25 mg Oral Daily Bennett, Christal H, NP   25 mg at 02/10/24 0934   magnesium hydroxide (MILK OF MAGNESIA) suspension 30 mL  30 mL Oral Daily PRN Motley-Mangrum, Jadeka A, PMHNP       nicotine  (NICODERM CQ  - dosed in mg/24 hours) patch 14 mg  14 mg Transdermal Daily Onuoha, Chinwendu V, NP   14 mg at 02/10/24 0934   oxyCODONE  (Oxy IR/ROXICODONE ) immediate release tablet 10 mg  10 mg Oral Q6H PRN Prentis Kitchens A, DO   10 mg at 02/10/24 1132   valACYclovir  (VALTREX ) tablet 500 mg  500 mg Oral Daily Bennett, Christal H, NP   500 mg at 02/10/24 0934   PTA Medications: Medications Prior to Admission  Medication Sig Dispense Refill Last Dose/Taking   amphetamine -dextroamphetamine  (ADDERALL) 10 MG tablet Take 30 mg by mouth daily with breakfast. (Patient taking differently: Take 30 mg by mouth 2 (two) times daily with a meal.)   Taking Differently   valACYclovir  (VALTREX ) 500 MG tablet Take 500 mg by mouth daily.   Past Month  albuterol  (VENTOLIN  HFA) 108 (90 Base) MCG/ACT inhaler Inhale 1-2 puffs into the lungs every 6 (six) hours as needed for wheezing or shortness of breath.      ALPRAZolam  (XANAX ) 0.5 MG tablet Take 1 tablet (0.5 mg total) by mouth at bedtime as needed for anxiety or sleep. LAST REFILL. 15 tablet 0    baclofen (LIORESAL) 10 MG tablet Take 10 mg by mouth 2 (two) times daily.      citalopram  (CELEXA ) 20 MG tablet Take 1 tablet (20 mg total) by mouth daily. 90 tablet 1    gabapentin (NEURONTIN) 300 MG capsule Take 300 mg by mouth every 8 (eight) hours.      losartan  (COZAAR ) 25 MG tablet Take 1 tablet (25 mg total) by mouth daily. 90 tablet 1    oxyCODONE  (OXY IR/ROXICODONE ) 5 MG immediate release tablet Take 1 tablet (5 mg total) by mouth every 6 (six) hours as needed for severe pain (pain  score 7-10). (Patient not taking: Reported on 01/26/2024) 120 tablet 0    Oxycodone  HCl 10 MG TABS Take 10 mg by mouth every 6 (six) hours as needed.      vitamin B-12 (CYANOCOBALAMIN ) 500 MCG tablet Take 1 tablet (500 mcg total) by mouth daily.       Patient Stressors: Marital or family conflict   Medication change or noncompliance   Substance abuse    Patient Strengths: Automotive Engineer for treatment/growth   Treatment Modalities: Medication Management, Group therapy, Case management,  1 to 1 session with clinician, Psychoeducation, Recreational therapy.   Physician Treatment Plan for Primary Diagnosis: Psychoactive substance-induced disorder (HCC) Long Term Goal(s): Improvement in symptoms so as ready for discharge   Short Term Goals: Ability to identify changes in lifestyle to reduce recurrence of condition will improve Ability to verbalize feelings will improve Ability to disclose and discuss suicidal ideas Ability to demonstrate self-control will improve Ability to identify and develop effective coping behaviors will improve Ability to maintain clinical measurements within normal limits will improve Compliance with prescribed medications will improve Ability to identify triggers associated with substance abuse/mental health issues will improve  Medication Management: Evaluate patient's response, side effects, and tolerance of medication regimen.  Therapeutic Interventions: 1 to 1 sessions, Unit Group sessions and Medication administration.  Evaluation of Outcomes: Adequate for Discharge  Physician Treatment Plan for Secondary Diagnosis: Principal Problem:   Psychoactive substance-induced disorder (HCC) Active Problems:   Chronic pain syndrome   Essential hypertension   Generalized anxiety disorder   History of COPD  Long Term Goal(s): Improvement in symptoms so as ready for discharge   Short Term Goals: Ability to identify changes in lifestyle to  reduce recurrence of condition will improve Ability to verbalize feelings will improve Ability to disclose and discuss suicidal ideas Ability to demonstrate self-control will improve Ability to identify and develop effective coping behaviors will improve Ability to maintain clinical measurements within normal limits will improve Compliance with prescribed medications will improve Ability to identify triggers associated with substance abuse/mental health issues will improve     Medication Management: Evaluate patient's response, side effects, and tolerance of medication regimen.  Therapeutic Interventions: 1 to 1 sessions, Unit Group sessions and Medication administration.  Evaluation of Outcomes: Adequate for Discharge   RN Treatment Plan for Primary Diagnosis: Psychoactive substance-induced disorder (HCC) Long Term Goal(s): Knowledge of disease and therapeutic regimen to maintain health will improve  Short Term Goals: Ability to verbalize feelings will improve  Medication Management:  RN will administer medications as ordered by provider, will assess and evaluate patient's response and provide education to patient for prescribed medication. RN will report any adverse and/or side effects to prescribing provider.  Therapeutic Interventions: 1 on 1 counseling sessions, Psychoeducation, Medication administration, Evaluate responses to treatment, Monitor vital signs and CBGs as ordered, Perform/monitor CIWA, COWS, AIMS and Fall Risk screenings as ordered, Perform wound care treatments as ordered.  Evaluation of Outcomes: Adequate for Discharge   LCSW Treatment Plan for Primary Diagnosis: Psychoactive substance-induced disorder [facility]) Long Term Goal(s): Safe transition to appropriate next level of care at discharge, Engage patient in therapeutic group addressing interpersonal concerns.  Short Term Goals: Engage patient in aftercare planning with referrals and resources and Increase skills for  wellness and recovery  Therapeutic Interventions: Assess for all discharge needs, 1 to 1 time with Social worker, Explore available resources and support systems, Assess for adequacy in community support network, Educate family and significant other(s) on suicide prevention, Complete Psychosocial Assessment, Interpersonal group therapy.  Evaluation of Outcomes: Adequate for Discharge   Progress in Treatment: Attending groups: No. Participating in groups: No. Taking medication as prescribed: Yes. Toleration medication: Yes. Family/Significant other contact made: Yes, individual(s) contacted:  Zane Phlegm, daughter  [phone] Patient understands diagnosis: Yes. Discussing patient identified problems/goals with staff: Yes. Medical problems stabilized or resolved: Yes. Denies suicidal/homicidal ideation: Yes. Issues/concerns per patient self-inventory: No.  New problem(s) identified: No, Describe:  none  New Short Term/Long Term Goal(s): medication stabilization, elimination of SI thoughts, development of comprehensive mental wellness plan.    Patient Goals:  Work on me and be better  Discharge Plan or Barriers: Patient recently admitted. CSW will continue to follow and assess for appropriate referrals and possible discharge planning.    Reason for Continuation of Hospitalization: Anxiety Medication stabilization  Estimated Length of Stay: Pt discharging this evening around 530PM  Last 3 [place] Suicide Severity Risk Score: Flowsheet Row [facility] (Current[facility] from [facility]07/2024 [facility] ED from 01/04/2024 in [facility] at [facility]  C-SSRS RISK CATEGORY No Risk No Risk No Risk    Last [facility] 2/9 Scores:    10/22/2023    1:44 PM 08/28/2023   10:25 AM 02/25/2023   11:29 AM  Depression screen PHQ 2/9  Decreased Interest 1 0 1   Down, Depressed, Hopeless 1 0 1  PHQ - 2 Score 2 0 2  Altered sleeping 2 0 2  Tired, decreased energy 2 0 2  Change in appetite 1 0 2  Feeling bad or failure about yourself  1 0 1  Trouble concentrating 2 0 1  Moving slowly or fidgety/restless 1 0 0  Suicidal thoughts 0 0 0  PHQ-9 Score 11  0  10   Difficult doing work/chores   Very difficult     Data saved with a previous flowsheet row definition    Scribe for Treatment Team: Jenkins LULLA Primer, LCSWA 02/10/2024 1:10 PM

## 2024-02-10 NOTE — Group Note (Signed)
 Date:  02/10/2024 Time:  1:02 PM  Group Topic/Focus:  Pharmacy Group     Participation Level:  Did Not Attend    Leslie Lowery 02/10/2024, 1:02 PM

## 2024-02-10 NOTE — Progress Notes (Signed)
  [facility] Adult Case Management Discharge Plan :  Will you be returning to the same living situation after discharge:  Yes,  pt returning back home with daughter at discharge At discharge, do you have transportation home?: Yes,  CSW arranged [facility] Transport to [facility] for pt to get her car at 5:30PM (per NP request, same day discharge). Pt states she will then return back home.  Do you have the ability to pay for your medications: Yes,  pt has active health insurance coverage  Release of information consent forms completed and in the chart;  Patient's signature needed at discharge.  Patient to Follow up at:  Follow-up Information     Services, [facility]. Go on 02/12/2024.   Why: You have a hospital follow up appointment for therapy and medication management  services on 02/12/24 at 12:00 pm, in person. Contact information: [address] [place] [address] [phone]                 Next level of care provider has access to [facility] Link:no  Safety Planning and Suicide Prevention discussed: [place]   Zane Phlegm, daughter  [phone]     Has patient been referred to the Quitline?: Patient refused referral for treatment  Patient has been referred for addiction treatment: No known substance use disorder.  Jenkins LULLA Primer, LCSWA 02/10/2024, 9:18 AM

## 2024-02-10 NOTE — Group Note (Signed)
 Recreation Therapy Group Note   Group Topic:Leisure Education  Group Date: 02/10/2024 Start Time: 0933 End Time: 1004 Facilitators: Gini Caputo-McCall, LRT,CTRS Location: [address] Dayroom   Group Topic: Leisure Education    Goal Area(s) Addresses:  Patient will successfully identify benefits of leisure participation. Patient will successfully identify ways to access leisure activities. Patient will identify what leisure is.  Behavioral Response:    Intervention: Poster Making   Activity: Patients and LRT discussed what leisure was and it's purpose. Patients had to option of working alone or with a partner to create a poster about leisure in general or a specific activity. In that poster, patients were to identify the benefits, types of leisure and importance of it. Patients shared their posters with peers during processing. Patients were debriefed on being active in leisure, and also how leisure can still provide you with lessons to help you learn.   Education:  Leisure Education, Publishing Copy Outcome: Acknowledges education    Affect/Mood: N/A   Participation Level: Did not attend    Clinical Observations/Individualized Feedback:      Plan: Continue to engage patient in RT group sessions 2-3x/week.   Leslie Lowery, LRT,CTRS 02/10/2024 12:11 PM

## 2024-02-10 NOTE — Group Note (Signed)
 Date:  02/10/2024 Time:  4:32 PM  Group Topic/Focus: Spiritual Wellness (By Elia)    Participation Level:  Did Not Attend   Leslie Lowery 02/10/2024, 4:32 PM

## 2024-02-10 NOTE — Group Note (Signed)
 Date:  02/10/2024 Time:  10:19 AM  Group Topic/Focus:  Goals Group:   The focus of this group is to help patients establish daily goals to achieve during treatment and discuss how the patient can incorporate goal setting into their daily lives to aide in recovery. Orientation:   The focus of this group is to educate the patient on the purpose and policies of crisis stabilization and provide a format to answer questions about their admission.  The group details unit policies and expectations of patients while admitted.    Participation Level:  Did Not Attend  Leslie Lowery Mars 02/10/2024, 10:19 AM

## 2024-02-10 NOTE — Progress Notes (Signed)
   02/10/24 0900  Psych Admission Type (Psych Patients Only)  Admission Status Involuntary  Psychosocial Assessment  Patient Complaints None;Other (Comment) (pain)  Eye Contact Fair  Facial Expression Animated  Affect Preoccupied  Speech Logical/coherent  Interaction Assertive  Motor Activity Slow  Appearance/Hygiene Disheveled  Behavior Characteristics Cooperative;Calm;Anxious  Mood Preoccupied  Thought Process  Coherency WDL  Content WDL  Delusions None reported or observed  Perception WDL  Hallucination None reported or observed  Judgment Poor  Confusion None  Danger to Self  Current suicidal ideation? Denies  Agreement Not to Harm Self Yes  Description of Agreement verbal  Danger to Others  Danger to Others None reported or observed

## 2024-02-10 NOTE — [facility] Suicide Risk Assessment (Signed)
 [facility] INPATIENT:  Family/Significant Other Suicide Prevention Education  Suicide Prevention Education:  Education Completed;  Zane Phlegm, daughter  [phone],  (name of family member/significant other) has been identified by the patient as the family member/significant other with whom the patient will be residing, and identified as the person(s) who will aid the patient in the event of a mental health crisis (suicidal ideations/suicide attempt).  With written consent from the patient, the family member/significant other has been provided the following suicide prevention education, prior to the and/or following the discharge of the patient.  Pt's daughter does not have safety concerns with patient discharge today. No access to weapons or firearms in the home. Aware pt will arrive back home later this evening.  Daughter would like a call from the NP/MD prior to discharge. NP made aware.  The suicide prevention education provided includes the following: Suicide risk factors Suicide prevention and interventions National Suicide Hotline telephone number [facility] assessment telephone number [facility] Emergency Assistance 911 [facility] and/or Residential Mobile Crisis Unit telephone number  Request made of family/significant other to: Remove weapons (e.g., guns, rifles, knives), all items previously/currently identified as safety concern.   Remove drugs/medications (over-the-counter, prescriptions, illicit drugs), all items previously/currently identified as a safety concern.  The family member/significant other verbalizes understanding of the suicide prevention education information provided.  The family member/significant other agrees to remove the items of safety concern listed above.  Jenkins LULLA Primer 02/10/2024, 9:39 AM

## 2024-02-16 LAB — HEAVY METALS, BLOOD
Arsenic: 4 ug/L (ref 0–9)
Lead: <1 ug/dL (ref 0.0–3.4)
Mercury: 2.8 ug/L (ref 0.0–14.9)

## 2024-03-02 ENCOUNTER — Encounter (INDEPENDENT_AMBULATORY_CARE_PROVIDER_SITE_OTHER): Payer: Self-pay | Admitting: *Deleted

## 2024-03-18 ENCOUNTER — Other Ambulatory Visit: Payer: Self-pay | Admitting: Internal Medicine

## 2024-03-18 DIAGNOSIS — F331 Major depressive disorder, recurrent, moderate: Secondary | ICD-10-CM

## 2024-04-14 ENCOUNTER — Other Ambulatory Visit: Payer: Self-pay

## 2024-04-14 DIAGNOSIS — Z122 Encounter for screening for malignant neoplasm of respiratory organs: Secondary | ICD-10-CM

## 2024-04-14 DIAGNOSIS — Z87891 Personal history of nicotine dependence: Secondary | ICD-10-CM

## 2024-04-14 DIAGNOSIS — F17210 Nicotine dependence, cigarettes, uncomplicated: Secondary | ICD-10-CM

## 2024-07-14 ENCOUNTER — Ambulatory Visit: Admitting: Neurology
